# Patient Record
Sex: Female | Born: 1950 | Race: White | Hispanic: No | Marital: Married | Smoking: Former smoker
Health system: Southern US, Community
[De-identification: ages and names within clinical notes are randomized; demographics above are authoritative.]

## PROBLEM LIST (undated history)

## (undated) DIAGNOSIS — Z90710 Acquired absence of both cervix and uterus: Secondary | ICD-10-CM

## (undated) DIAGNOSIS — E039 Hypothyroidism, unspecified: Secondary | ICD-10-CM

## (undated) DIAGNOSIS — Z9889 Other specified postprocedural states: Secondary | ICD-10-CM

## (undated) DIAGNOSIS — Z780 Asymptomatic menopausal state: Secondary | ICD-10-CM

## (undated) DIAGNOSIS — M1990 Unspecified osteoarthritis, unspecified site: Secondary | ICD-10-CM

## (undated) DIAGNOSIS — Z8249 Family history of ischemic heart disease and other diseases of the circulatory system: Secondary | ICD-10-CM

## (undated) DIAGNOSIS — E785 Hyperlipidemia, unspecified: Secondary | ICD-10-CM

## (undated) DIAGNOSIS — M797 Fibromyalgia: Secondary | ICD-10-CM

## (undated) DIAGNOSIS — K219 Gastro-esophageal reflux disease without esophagitis: Secondary | ICD-10-CM

## (undated) DIAGNOSIS — R112 Nausea with vomiting, unspecified: Secondary | ICD-10-CM

## (undated) DIAGNOSIS — J302 Other seasonal allergic rhinitis: Secondary | ICD-10-CM

## (undated) HISTORY — DX: Acquired absence of both cervix and uterus: Z90.710

## (undated) HISTORY — DX: Morbid (severe) obesity due to excess calories: E66.01

## (undated) HISTORY — DX: Hyperlipidemia, unspecified: E78.5

## (undated) HISTORY — DX: Family history of ischemic heart disease and other diseases of the circulatory system: Z82.49

## (undated) HISTORY — PX: TONSILLECTOMY: SUR1361

## (undated) HISTORY — DX: Asymptomatic menopausal state: Z78.0

## (undated) HISTORY — PX: ABDOMINAL HYSTERECTOMY: SHX81

---

## 1975-03-26 HISTORY — PX: GASTRIC BYPASS: SHX52

## 1997-12-02 ENCOUNTER — Emergency Department (HOSPITAL_COMMUNITY): Admission: EM | Admit: 1997-12-02 | Discharge: 1997-12-02 | Payer: Self-pay | Admitting: *Deleted

## 1997-12-02 ENCOUNTER — Encounter: Payer: Self-pay | Admitting: Emergency Medicine

## 1999-06-19 ENCOUNTER — Encounter: Payer: Self-pay | Admitting: Family Medicine

## 1999-06-19 ENCOUNTER — Encounter: Admission: RE | Admit: 1999-06-19 | Discharge: 1999-06-19 | Payer: Self-pay | Admitting: Family Medicine

## 2000-03-18 ENCOUNTER — Emergency Department (HOSPITAL_COMMUNITY): Admission: EM | Admit: 2000-03-18 | Discharge: 2000-03-18 | Payer: Self-pay | Admitting: Emergency Medicine

## 2000-03-18 ENCOUNTER — Encounter: Payer: Self-pay | Admitting: *Deleted

## 2002-05-12 ENCOUNTER — Encounter: Payer: Self-pay | Admitting: Family Medicine

## 2002-05-12 ENCOUNTER — Encounter: Admission: RE | Admit: 2002-05-12 | Discharge: 2002-05-12 | Payer: Self-pay | Admitting: Family Medicine

## 2003-05-16 ENCOUNTER — Encounter: Admission: RE | Admit: 2003-05-16 | Discharge: 2003-05-16 | Payer: Self-pay | Admitting: Family Medicine

## 2004-06-11 ENCOUNTER — Encounter: Admission: RE | Admit: 2004-06-11 | Discharge: 2004-06-11 | Payer: Self-pay | Admitting: Family Medicine

## 2006-10-11 ENCOUNTER — Emergency Department (HOSPITAL_COMMUNITY): Admission: EM | Admit: 2006-10-11 | Discharge: 2006-10-12 | Payer: Self-pay | Admitting: *Deleted

## 2006-10-14 ENCOUNTER — Ambulatory Visit: Payer: Self-pay | Admitting: Family Medicine

## 2006-12-03 ENCOUNTER — Ambulatory Visit: Payer: Self-pay | Admitting: Family Medicine

## 2006-12-15 ENCOUNTER — Other Ambulatory Visit: Admission: RE | Admit: 2006-12-15 | Discharge: 2006-12-15 | Payer: Self-pay | Admitting: Family Medicine

## 2006-12-15 ENCOUNTER — Ambulatory Visit: Payer: Self-pay | Admitting: Family Medicine

## 2007-01-01 ENCOUNTER — Encounter: Admission: RE | Admit: 2007-01-01 | Discharge: 2007-01-01 | Payer: Self-pay | Admitting: Family Medicine

## 2007-04-26 HISTORY — PX: ROTATOR CUFF REPAIR: SHX139

## 2007-05-06 ENCOUNTER — Emergency Department (HOSPITAL_COMMUNITY): Admission: EM | Admit: 2007-05-06 | Discharge: 2007-05-06 | Payer: Self-pay | Admitting: Emergency Medicine

## 2007-05-08 ENCOUNTER — Ambulatory Visit: Payer: Self-pay | Admitting: Family Medicine

## 2007-05-12 ENCOUNTER — Ambulatory Visit (HOSPITAL_COMMUNITY): Admission: RE | Admit: 2007-05-12 | Discharge: 2007-05-12 | Payer: Self-pay | Admitting: Family Medicine

## 2007-05-18 ENCOUNTER — Ambulatory Visit: Payer: Self-pay | Admitting: Family Medicine

## 2007-08-19 ENCOUNTER — Ambulatory Visit: Payer: Self-pay | Admitting: Family Medicine

## 2007-08-24 HISTORY — PX: ROTATOR CUFF REPAIR: SHX139

## 2007-12-29 ENCOUNTER — Ambulatory Visit: Payer: Self-pay | Admitting: Family Medicine

## 2007-12-29 ENCOUNTER — Encounter: Admission: RE | Admit: 2007-12-29 | Discharge: 2007-12-29 | Payer: Self-pay | Admitting: Family Medicine

## 2008-02-17 ENCOUNTER — Encounter: Admission: RE | Admit: 2008-02-17 | Discharge: 2008-02-17 | Payer: Self-pay | Admitting: Family Medicine

## 2008-09-09 ENCOUNTER — Emergency Department (HOSPITAL_COMMUNITY): Admission: EM | Admit: 2008-09-09 | Discharge: 2008-09-09 | Payer: Self-pay | Admitting: Emergency Medicine
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
## 2009-03-06 ENCOUNTER — Encounter: Admission: RE | Admit: 2009-03-06 | Discharge: 2009-03-06 | Payer: Self-pay | Admitting: Family Medicine

## 2009-05-26 ENCOUNTER — Ambulatory Visit (HOSPITAL_COMMUNITY): Admission: RE | Admit: 2009-05-26 | Discharge: 2009-05-26 | Payer: Self-pay | Admitting: Gastroenterology

## 2009-06-23 HISTORY — PX: COLONOSCOPY: SHX174

## 2009-08-06 ENCOUNTER — Emergency Department (HOSPITAL_COMMUNITY): Admission: EM | Admit: 2009-08-06 | Discharge: 2009-08-06 | Payer: Self-pay | Admitting: Emergency Medicine

## 2010-01-30 ENCOUNTER — Emergency Department (HOSPITAL_COMMUNITY): Admission: EM | Admit: 2010-01-30 | Discharge: 2010-01-30 | Payer: Self-pay | Admitting: Emergency Medicine

## 2010-03-16 ENCOUNTER — Encounter
Admission: RE | Admit: 2010-03-16 | Discharge: 2010-03-16 | Payer: Self-pay | Source: Home / Self Care | Attending: Family Medicine | Admitting: Family Medicine

## 2010-04-15 ENCOUNTER — Encounter: Payer: Self-pay | Admitting: Family Medicine

## 2010-06-05 LAB — URINALYSIS, ROUTINE W REFLEX MICROSCOPIC
Bilirubin Urine: NEGATIVE
Glucose, UA: NEGATIVE mg/dL
Hgb urine dipstick: NEGATIVE
Nitrite: NEGATIVE
Protein, ur: NEGATIVE mg/dL
Specific Gravity, Urine: 1.025 (ref 1.005–1.030)
Urobilinogen, UA: 0.2 mg/dL (ref 0.0–1.0)
pH: 6.5 (ref 5.0–8.0)

## 2010-06-05 LAB — CBC
HCT: 40 % (ref 36.0–46.0)
MCH: 31.1 pg (ref 26.0–34.0)
MCV: 91.5 fL (ref 78.0–100.0)
RBC: 4.37 MIL/uL (ref 3.87–5.11)
RDW: 12.9 % (ref 11.5–15.5)

## 2010-06-05 LAB — DIFFERENTIAL
Basophils Absolute: 0 10*3/uL (ref 0.0–0.1)
Lymphocytes Relative: 37 % (ref 12–46)
Neutro Abs: 3.8 10*3/uL (ref 1.7–7.7)

## 2010-06-05 LAB — BASIC METABOLIC PANEL
Chloride: 108 mEq/L (ref 96–112)
Creatinine, Ser: 0.84 mg/dL (ref 0.4–1.2)
Glucose, Bld: 98 mg/dL (ref 70–99)

## 2010-07-02 LAB — POCT CARDIAC MARKERS
Myoglobin, poc: 104 ng/mL (ref 12–200)
Troponin i, poc: <0.05 ng/mL (ref 0.00–0.09)
Troponin i, poc: <0.05 ng/mL (ref 0.00–0.09)

## 2010-07-02 LAB — POCT PREGNANCY, URINE: Preg Test, Ur: NEGATIVE

## 2010-07-02 LAB — URINE MICROSCOPIC-ADD ON

## 2010-07-02 LAB — CBC
MCV: 86.8 fL (ref 78.0–100.0)
Platelets: 194 10*3/uL (ref 150–400)
RDW: 15.9 % — ABNORMAL HIGH (ref 11.5–15.5)
WBC: 7.4 10*3/uL (ref 4.0–10.5)

## 2010-07-02 LAB — URINALYSIS, ROUTINE W REFLEX MICROSCOPIC
Bilirubin Urine: NEGATIVE
Nitrite: NEGATIVE
Protein, ur: NEGATIVE mg/dL
Urobilinogen, UA: 0.2 mg/dL (ref 0.0–1.0)

## 2010-07-02 LAB — POCT I-STAT, CHEM 8
Chloride: 109 mEq/L (ref 96–112)
HCT: 38 % (ref 36.0–46.0)
TCO2: 24 mmol/L (ref 0–100)

## 2010-11-21 ENCOUNTER — Emergency Department (HOSPITAL_COMMUNITY)
Admission: EM | Admit: 2010-11-21 | Discharge: 2010-11-22 | Disposition: A | Discharge status: Home / Self Care | Payer: No Typology Code available for payment source | Attending: Emergency Medicine | Admitting: Emergency Medicine

## 2010-11-21 DIAGNOSIS — R51 Headache: Secondary | ICD-10-CM | POA: Insufficient documentation

## 2010-11-21 DIAGNOSIS — Z7982 Long term (current) use of aspirin: Secondary | ICD-10-CM | POA: Insufficient documentation

## 2010-11-21 DIAGNOSIS — Z79899 Other long term (current) drug therapy: Secondary | ICD-10-CM | POA: Insufficient documentation

## 2010-11-21 DIAGNOSIS — S335XXA Sprain of ligaments of lumbar spine, initial encounter: Secondary | ICD-10-CM | POA: Insufficient documentation

## 2010-11-21 DIAGNOSIS — E7800 Pure hypercholesterolemia, unspecified: Secondary | ICD-10-CM | POA: Insufficient documentation

## 2010-11-21 DIAGNOSIS — E039 Hypothyroidism, unspecified: Secondary | ICD-10-CM | POA: Insufficient documentation

## 2010-11-21 DIAGNOSIS — M79609 Pain in unspecified limb: Secondary | ICD-10-CM | POA: Insufficient documentation

## 2010-12-14 LAB — I-STAT 8, (EC8 V) (CONVERTED LAB)
Acid-base deficit: 1
BUN: 14
Chloride: 106
HCT: 41
Hemoglobin: 13.9
Operator id: 146091
Potassium: 4.3

## 2010-12-14 LAB — HEPATIC FUNCTION PANEL
ALT: 49 — ABNORMAL HIGH
AST: 39 — ABNORMAL HIGH
Albumin: 3.4 — ABNORMAL LOW
Alkaline Phosphatase: 119 — ABNORMAL HIGH
Total Bilirubin: 0.4

## 2010-12-14 LAB — POCT I-STAT CREATININE
Creatinine, Ser: 0.9
Operator id: 146091

## 2010-12-31 ENCOUNTER — Other Ambulatory Visit: Payer: Self-pay | Admitting: Family Medicine

## 2010-12-31 DIAGNOSIS — M545 Low back pain: Secondary | ICD-10-CM

## 2011-01-02 ENCOUNTER — Ambulatory Visit
Admission: RE | Admit: 2011-01-02 | Discharge: 2011-01-02 | Disposition: A | Discharge status: Home / Self Care | Payer: No Typology Code available for payment source | Source: Ambulatory Visit | Attending: Family Medicine | Admitting: Family Medicine

## 2011-01-02 DIAGNOSIS — M545 Low back pain: Secondary | ICD-10-CM

## 2011-01-07 LAB — URINALYSIS, ROUTINE W REFLEX MICROSCOPIC
Bilirubin Urine: NEGATIVE
Glucose, UA: NEGATIVE
Hgb urine dipstick: NEGATIVE
Ketones, ur: NEGATIVE
Protein, ur: NEGATIVE

## 2011-01-07 LAB — URINE MICROSCOPIC-ADD ON

## 2011-01-07 LAB — URINE CULTURE

## 2011-01-14 IMAGING — CR DG CHEST 2V
2 series · 2 of 2 positions shown · non-contrast
Comparison: Chest 05/06/07.

CLINICAL DATA: Chest pain.

CHEST - 2 VIEW

[w chest pa]
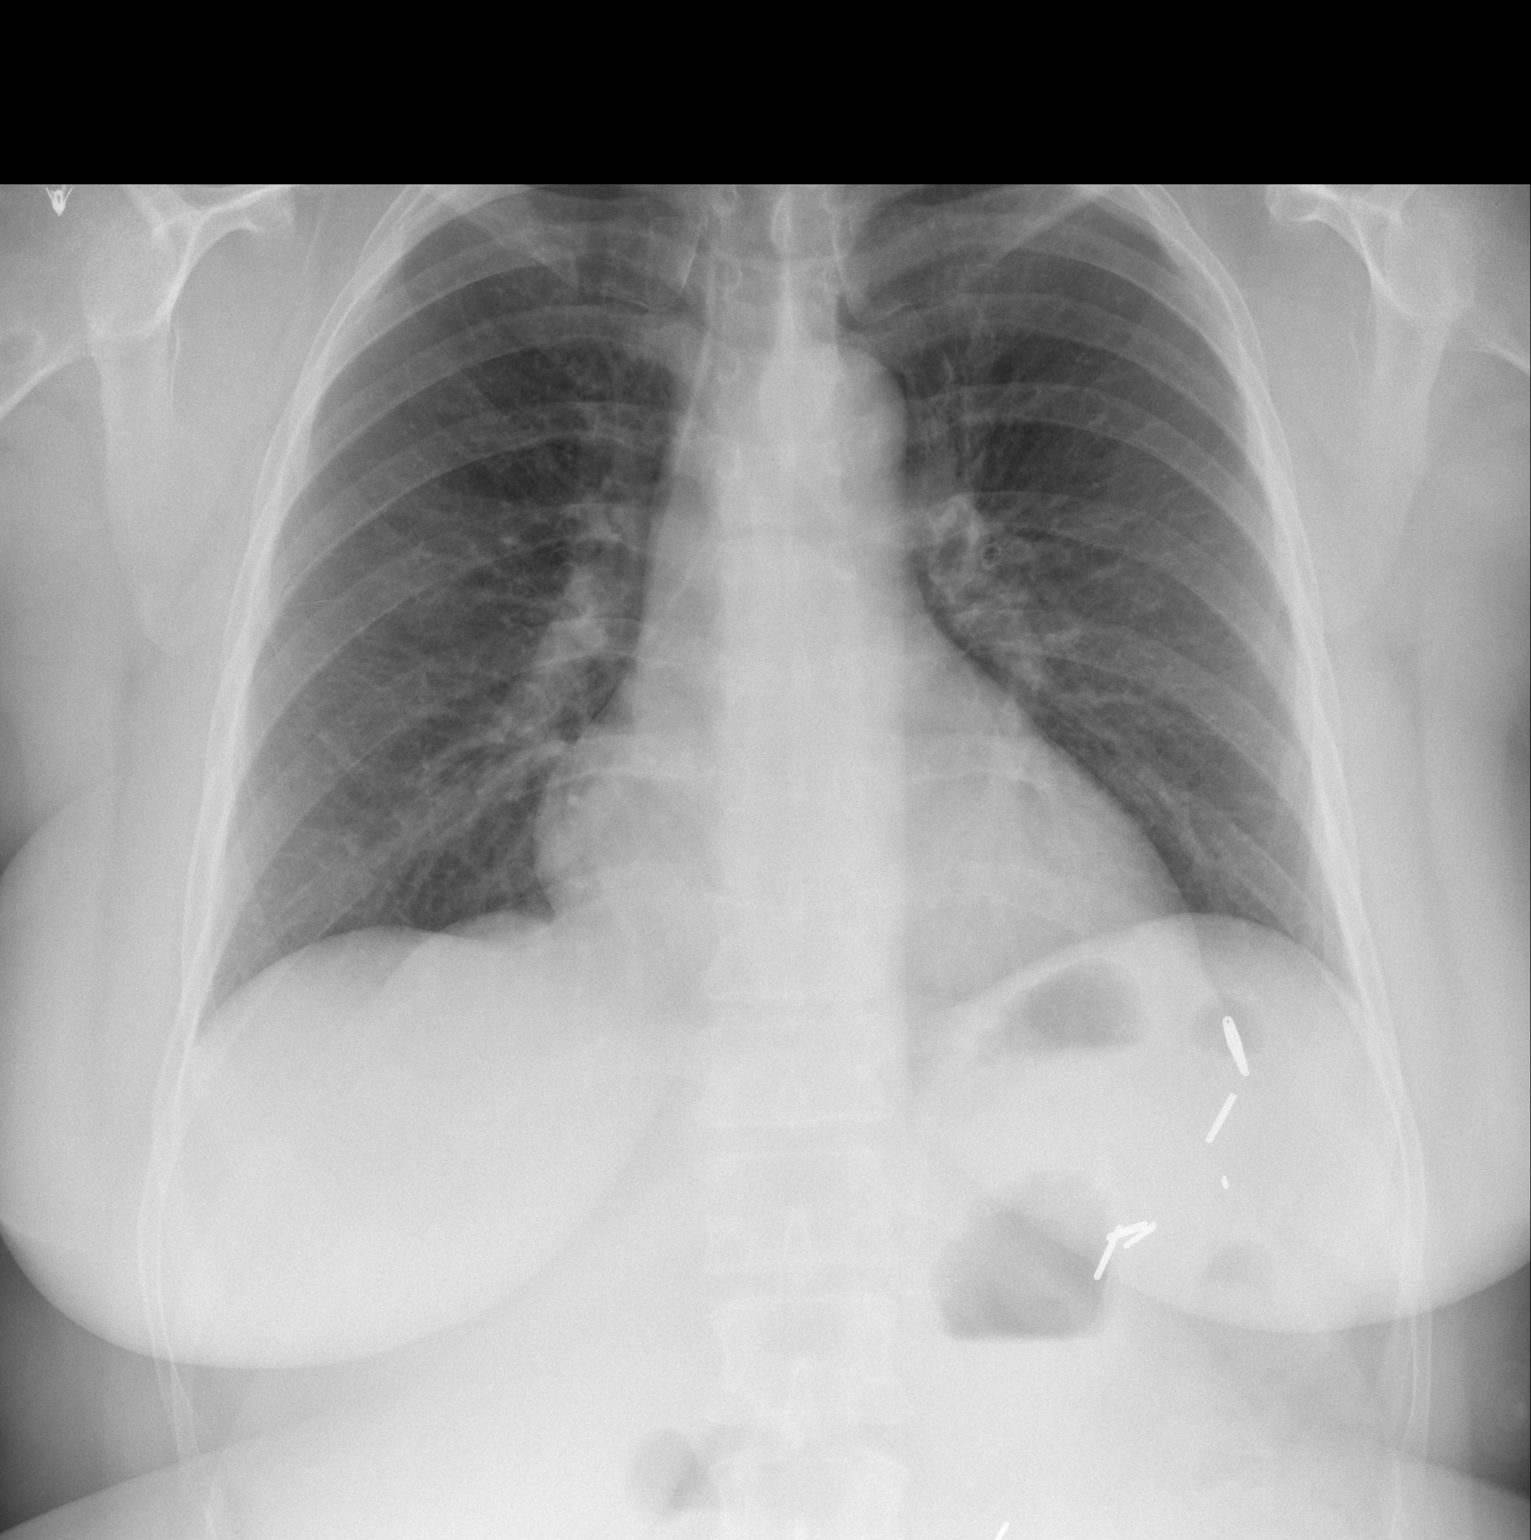

[w chest lat]
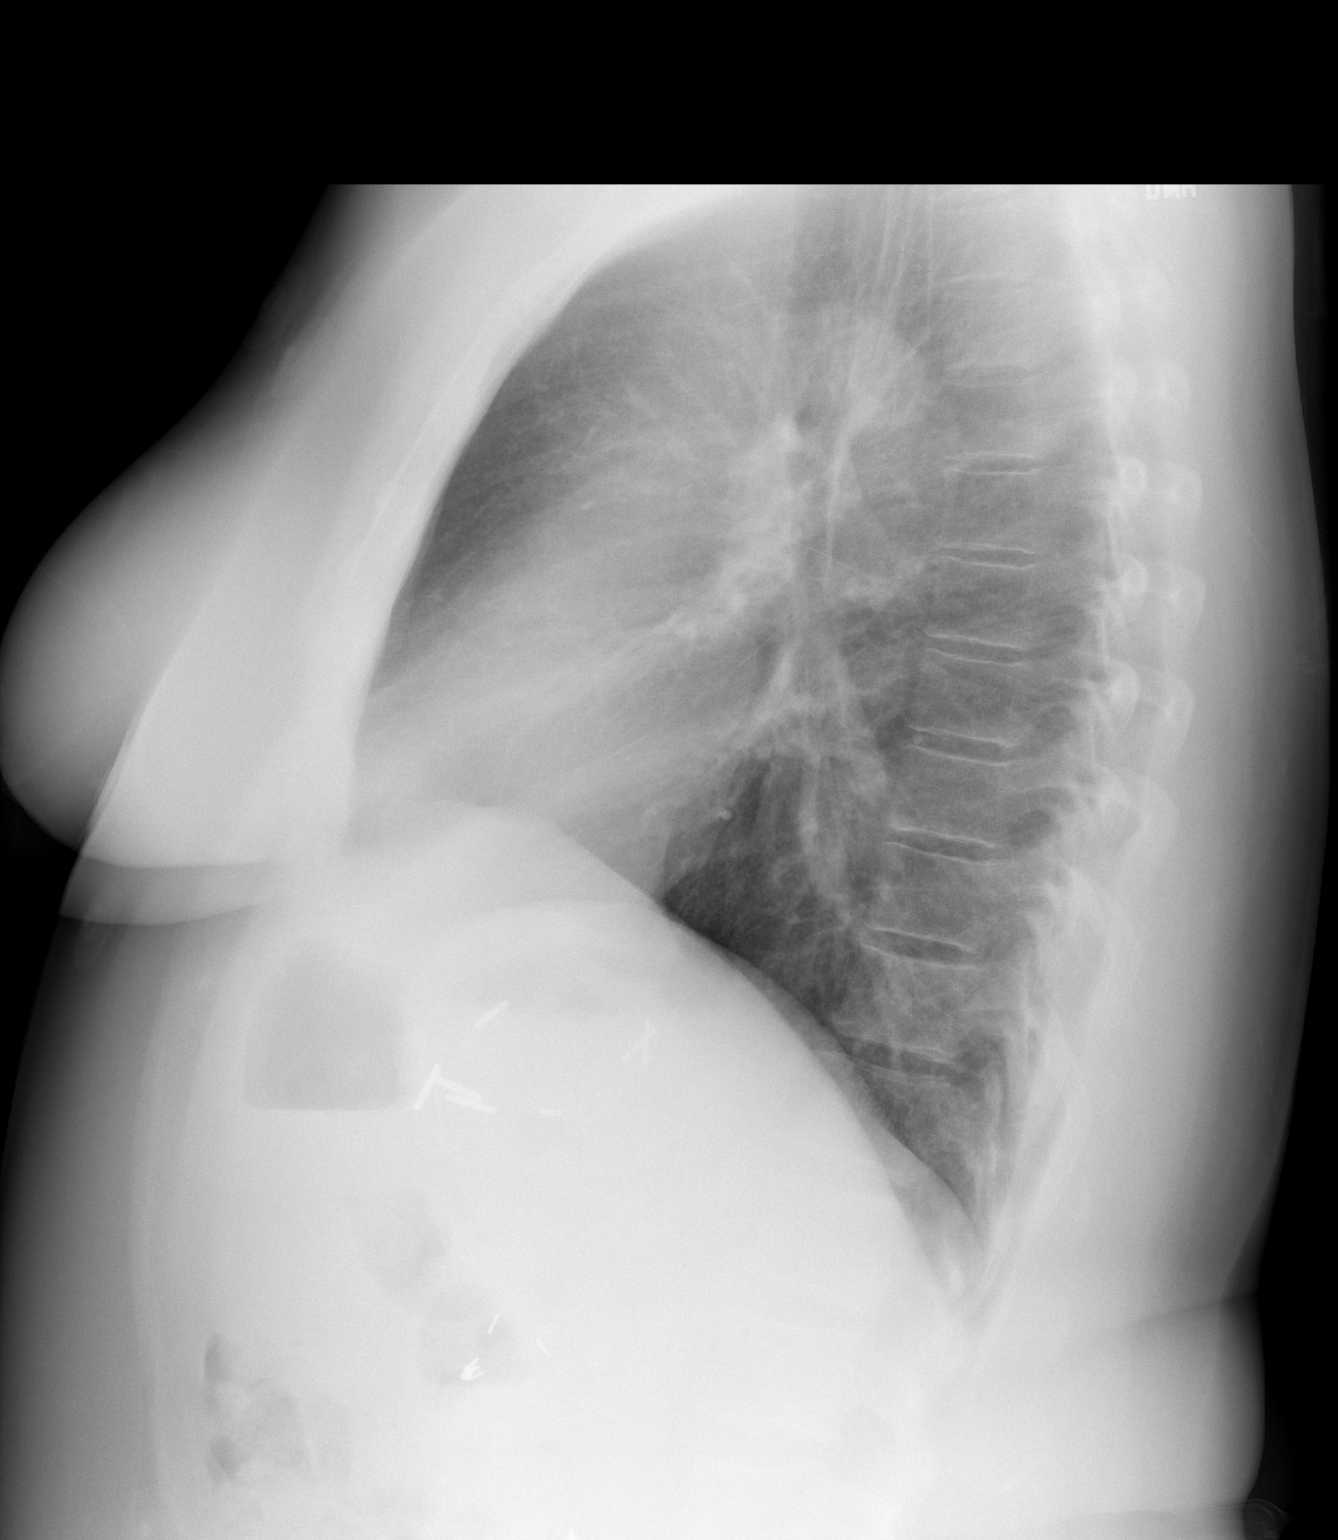

[2 of 2 positions shown; findings below may reference images not displayed]

FINDINGS: Lungs clear.  Heart size normal.  No effusion or focal
bony abnormality.  Surgical clips in the left upper quadrant again
noted.
IMPRESSION: No acute finding.  Stable compared to prior exam.

## 2011-02-27 ENCOUNTER — Ambulatory Visit: Payer: 59 | Attending: Neurology | Admitting: Physical Therapy

## 2011-02-27 DIAGNOSIS — IMO0001 Reserved for inherently not codable concepts without codable children: Secondary | ICD-10-CM | POA: Insufficient documentation

## 2011-02-27 DIAGNOSIS — R5381 Other malaise: Secondary | ICD-10-CM | POA: Insufficient documentation

## 2011-02-27 DIAGNOSIS — M545 Low back pain, unspecified: Secondary | ICD-10-CM | POA: Insufficient documentation

## 2011-03-01 ENCOUNTER — Ambulatory Visit: Payer: 59 | Admitting: *Deleted

## 2011-03-04 ENCOUNTER — Ambulatory Visit: Payer: 59 | Admitting: Physical Therapy

## 2011-03-06 ENCOUNTER — Ambulatory Visit: Payer: 59 | Admitting: Physical Therapy

## 2011-03-11 ENCOUNTER — Ambulatory Visit: Payer: 59 | Admitting: Physical Therapy

## 2011-03-13 ENCOUNTER — Ambulatory Visit: Payer: 59 | Admitting: Physical Therapy

## 2011-03-20 ENCOUNTER — Ambulatory Visit: Payer: 59 | Admitting: Physical Therapy

## 2011-06-03 ENCOUNTER — Encounter: Payer: Self-pay | Admitting: *Deleted

## 2011-06-11 ENCOUNTER — Other Ambulatory Visit: Payer: Self-pay | Admitting: Family Medicine

## 2011-06-11 DIAGNOSIS — Z1231 Encounter for screening mammogram for malignant neoplasm of breast: Secondary | ICD-10-CM

## 2011-06-19 ENCOUNTER — Ambulatory Visit: Payer: No Typology Code available for payment source

## 2011-10-18 ENCOUNTER — Other Ambulatory Visit: Payer: Self-pay | Admitting: Family Medicine

## 2011-10-18 DIAGNOSIS — Z1231 Encounter for screening mammogram for malignant neoplasm of breast: Secondary | ICD-10-CM

## 2011-10-28 ENCOUNTER — Ambulatory Visit: Payer: No Typology Code available for payment source

## 2012-03-02 ENCOUNTER — Encounter: Payer: 59 | Admitting: *Deleted

## 2012-06-11 ENCOUNTER — Encounter: Payer: Self-pay | Admitting: Cardiology

## 2012-10-14 ENCOUNTER — Ambulatory Visit (INDEPENDENT_AMBULATORY_CARE_PROVIDER_SITE_OTHER): Payer: 59 | Admitting: Cardiology

## 2012-10-14 ENCOUNTER — Encounter: Payer: Self-pay | Admitting: Cardiology

## 2012-10-14 VITALS — BP 142/84 | HR 65 | Ht 64.0 in | Wt 258.0 lb

## 2012-10-14 DIAGNOSIS — R0609 Other forms of dyspnea: Secondary | ICD-10-CM | POA: Insufficient documentation

## 2012-10-14 DIAGNOSIS — R079 Chest pain, unspecified: Secondary | ICD-10-CM

## 2012-10-14 DIAGNOSIS — E785 Hyperlipidemia, unspecified: Secondary | ICD-10-CM

## 2012-10-14 NOTE — Patient Instructions (Signed)
We will schedule you for a nuclear stress test and an echocardiogram

## 2012-10-14 NOTE — Progress Notes (Signed)
Haley Larson Date of Birth: 1950-08-09 Medical Record #[id number]  History of Present Illness: [name] is seen at the request of Dr. Creta Levin for evaluation of dyspnea and chest pain. She is a pleasant 62 year old white female who I evaluated in 2009. She presented at that time with atypical chest pain. A stress Cardiolite study in December 2009 was normal. She does have a strong family history of coronary disease. She also has a history of hyperlipidemia. She denies a history of diabetes or hypertension. Recently she has experienced 2 episodes of left precordial chest pain. She describes this as a sharp pain that occurred at work. These episodes occurred about one month apart. Is associated with sweating and nausea. She also describes increased dyspnea on exertion. She reports a 20 pound weight gain this past year. She complains of numbness in her left fingers. She describes increased edema in her lower extremities. She had remote gastric bypass surgery in the 1970s. She has not been evaluated for sleep apnea in the past. She does snore.  Current Outpatient Prescriptions on File Prior to Visit  Medication Sig Dispense Refill  . aspirin 81 MG tablet Take 81 mg by mouth daily.      . Omega-3 Fatty Acids (FISH OIL PO) Take 3 tablets by mouth daily.       . rosuvastatin (CRESTOR) 10 MG tablet Take 10 mg by mouth daily.       No current facility-administered medications on file prior to visit.    No Known Allergies  Past Medical History  Diagnosis Date  . Chest pain   . H/O: hysterectomy   . Dyslipidemia   . Postmenopausal   . Morbid obesity   . Family history of early CAD     STRONG    Past Surgical History  Procedure Laterality Date  . Rotator cuff repair  04/2007  . Rotator cuff repair  08/2007  . Gastric bypass  1977    History  Smoking status  . Former Smoker  Smokeless tobacco  . Not on file    History  Alcohol Use No    Family History  Problem Relation  Age of Onset  . Other Father     infection   . Coronary artery disease Mother     with stents  . Atrial fibrillation Mother     with pacemaker  . Heart failure Mother   . Diabetes Mother   . Heart attack Brother 38    massive  MI  . Heart attack Brother 54    x3  . Heart attack Brother 38    with stent  . Coronary artery disease Brother     with stent  . Alcohol abuse Brother   . Coronary artery disease Brother     with stents    Review of Systems: As noted in history of present illness.. Patient has had extensive GI evaluation in the past including abdominal ultrasound, hepatobiliary scan, upper and lower endoscopy. All were unremarkable. All other systems were reviewed and are negative.  Physical Exam: BP 142/84  Pulse 65  Ht 5\' 4"  (1.626 m)  Wt 258 lb (117.028 kg)  BMI 44.26 kg/m2  SpO2 91% She is an obese white female in no acute distress. HEENT: Normocephalic, atraumatic. Pupils equal round and reactive. Extraocular movements are full. Sclera are clear. Oropharynx is clear. Dentition is in good repair. Neck is without JVD, adenopathy, thyromegaly, or bruits. Lungs: Clear Cardiovascular: Regular rate and rhythm, normal S1 and  S2, no gallop, murmur, or click. Abdomen: Morbidly obese, soft, nontender. No masses or hepatosplenomegaly. No tenderness. Bowel sounds are positive. Extremities: No cyanosis, clubbing, or edema. Pulses are 2+ and symmetric. Skin: Warm and dry Neuro: Alert and oriented x3.  LABORATORY DATA: Laboratory data reviewed from April 2014 demonstrated normal chemistry panel, CBC, and TSH. Lipids were under good control. ECG today demonstrates normal sinus rhythm. Cannot rule out septal infarct age undetermined. No acute ST changes.  Assessment / Plan: 1. Atypical chest pain. Cardiac risk factors of hyperlipidemia, obesity, and strong family history. I recommended a stress Myoview study which we will compare to her study in 2009.  2. Dyspnea. Patient  reports increased edema but her exam today is benign. We will obtain an echocardiogram to further evaluate these symptoms. If her cardiac evaluation is unrevealing I would consider a sleep study to rule out sleep apnea.  3. Morbid obesity.  4. Hyperlipidemia-controlled on statin therapy and fish oil.

## 2012-10-21 ENCOUNTER — Ambulatory Visit (HOSPITAL_BASED_OUTPATIENT_CLINIC_OR_DEPARTMENT_OTHER): Payer: 59 | Admitting: Radiology

## 2012-10-21 ENCOUNTER — Ambulatory Visit (HOSPITAL_COMMUNITY): Payer: 59 | Attending: Cardiovascular Disease | Admitting: Radiology

## 2012-10-21 VITALS — BP 103/59 | HR 59 | Ht 64.0 in | Wt 256.0 lb

## 2012-10-21 DIAGNOSIS — R0602 Shortness of breath: Secondary | ICD-10-CM

## 2012-10-21 DIAGNOSIS — E785 Hyperlipidemia, unspecified: Secondary | ICD-10-CM

## 2012-10-21 DIAGNOSIS — R5381 Other malaise: Secondary | ICD-10-CM | POA: Insufficient documentation

## 2012-10-21 DIAGNOSIS — R072 Precordial pain: Secondary | ICD-10-CM

## 2012-10-21 DIAGNOSIS — E669 Obesity, unspecified: Secondary | ICD-10-CM | POA: Insufficient documentation

## 2012-10-21 DIAGNOSIS — R0609 Other forms of dyspnea: Secondary | ICD-10-CM | POA: Insufficient documentation

## 2012-10-21 DIAGNOSIS — R079 Chest pain, unspecified: Secondary | ICD-10-CM

## 2012-10-21 DIAGNOSIS — Z8249 Family history of ischemic heart disease and other diseases of the circulatory system: Secondary | ICD-10-CM | POA: Insufficient documentation

## 2012-10-21 DIAGNOSIS — R0989 Other specified symptoms and signs involving the circulatory and respiratory systems: Secondary | ICD-10-CM | POA: Insufficient documentation

## 2012-10-21 DIAGNOSIS — Z87891 Personal history of nicotine dependence: Secondary | ICD-10-CM | POA: Insufficient documentation

## 2012-10-21 MED ORDER — TECHNETIUM TC 99M SESTAMIBI GENERIC - CARDIOLITE
33.0000 | Freq: Once | INTRAVENOUS | Status: AC | PRN
  Administered 2012-10-21: 33 via INTRAVENOUS

## 2012-10-21 NOTE — Progress Notes (Signed)
[facility] SITE 3 NUCLEAR MED [address] [place], [place] [address] [phone]    Cardiology Nuclear Med Study  Haley Larson is a 62 y.o. female     MRN : [id number]     DOB: 12/06/1950  Procedure Date: 10/21/2012  Nuclear Med Background Indication for Stress Test:  Evaluation for Ischemia History:  '09 MPS:no ischemia, EF=75%; 10/21/12 Echo:EF=60% Cardiac Risk Factors: Family History - CAD, History of Smoking, Lipids and Obesity  Symptoms:  Chest Pressure/Tightness (last episode of chest discomfort was today while getting echo done, none now), Diaphoresis, DOE and Fatigue with and without Exertion   Nuclear Pre-Procedure Caffeine/Decaff Intake:  None> 12 hrs NPO After: 8:00pm   Lungs:  Clear. O2 Sat: 96% on room air. IV 0.9% NS with Angio Cath:  22g  IV Site: L Antecubital x 1, tolerated well IV Started by:  Irean Hong, RN  Chest Size (in):  42 Cup Size: C  Height: 5\' 4"  (1.626 m)  Weight:  256 lb (116.121 kg)  BMI:  Body mass index is 43.92 kg/(m^2). Tech Comments:  n/a    Nuclear Med Study 1 or 2 day study: 2 day  Stress Test Type:  Stress  Reading MD: Marca Ancona, MD  Order Authorizing Provider:  Peter Swaziland, MD  Resting Radionuclide: Technetium 74m Sestamibi  Resting Radionuclide Dose: 33.0 mCi  10/28/12  Stress Radionuclide:  Technetium 49m Sestamibi  Stress Radionuclide Dose: 33.0 mCi   10/22/12          Stress Protocol Rest HR: 59 Stress HR: 150  Rest BP: 103/59 Stress BP: 233/84  Exercise Time (min): 5:01 METS: 7.0   Predicted Max HR: 160 bpm % Max HR: 93.75 bpm Rate Pressure Product: 21308   Dose of Adenosine (mg):  n/a Dose of Lexiscan: n/a mg  Dose of Atropine (mg): n/a Dose of Dobutamine: n/a mcg/kg/min (at max HR)  Stress Test Technologist: Smiley Houseman, CMA-N  Nuclear Technologist:  Doyne Keel, CNMT     Rest Procedure:  Myocardial perfusion imaging was performed at rest 45 minutes following the intravenous administration of  Technetium 24m Sestamibi.  Rest ECG: NSR - Normal EKG  Stress Procedure:  The patient exercised on the treadmill utilizing the Bruce Protocol for 5:01 minutes. The patient stopped due to fatigue and dyspnea with her O2 SATS dropping to 90.  She denied any chest pain.  Technetium 98m Sestamibi was injected at peak exercise and myocardial perfusion imaging was performed after a brief delay.  Stress ECG: No significant change from baseline ECG  QPS Raw Data Images:  Normal; no motion artifact; normal heart/lung ratio. Stress Images:  Normal homogeneous uptake in all areas of the myocardium. Rest Images:  Normal homogeneous uptake in all areas of the myocardium. Subtraction (SDS):  There is no evidence of scar or ischemia. Transient Ischemic Dilatation (Normal <1.22):  n/a Lung/Heart Ratio (Normal <0.45):  0.43  Quantitative Gated Spect Images QGS EDV:  73 ml QGS ESV:  17 ml   Impression Exercise Capacity:  Below average.  BP Response:  Normal blood pressure response. Clinical Symptoms:  Dyspnea, fatigue.  ECG Impression:  No significant ST segment change suggestive of ischemia. Comparison with Prior Nuclear Study: No significant change from previous study  Overall Impression:  Normal stress nuclear study.  LV Ejection Fraction: 77%.  LV Wall Motion:  NL LV Function; NL Wall Motion  Marca Ancona 10/28/2012

## 2012-10-21 NOTE — Progress Notes (Signed)
Echocardiogram performed.  

## 2012-10-28 ENCOUNTER — Ambulatory Visit (HOSPITAL_COMMUNITY): Payer: No Typology Code available for payment source | Attending: Cardiology | Admitting: Radiology

## 2012-10-28 DIAGNOSIS — R0989 Other specified symptoms and signs involving the circulatory and respiratory systems: Secondary | ICD-10-CM

## 2012-10-28 MED ORDER — TECHNETIUM TC 99M SESTAMIBI GENERIC - CARDIOLITE
33.0000 | Freq: Once | INTRAVENOUS | Status: AC | PRN
  Administered 2012-10-28: 33 via INTRAVENOUS

## 2012-12-28 ENCOUNTER — Other Ambulatory Visit: Payer: Self-pay | Admitting: Family Medicine

## 2012-12-28 DIAGNOSIS — Z1231 Encounter for screening mammogram for malignant neoplasm of breast: Secondary | ICD-10-CM

## 2012-12-29 ENCOUNTER — Ambulatory Visit
Admission: RE | Admit: 2012-12-29 | Discharge: 2012-12-29 | Disposition: A | Discharge status: Home / Self Care | Payer: 59 | Source: Ambulatory Visit | Attending: Family Medicine | Admitting: Family Medicine

## 2012-12-29 DIAGNOSIS — Z1231 Encounter for screening mammogram for malignant neoplasm of breast: Secondary | ICD-10-CM

## 2013-10-28 ENCOUNTER — Encounter: Payer: Self-pay | Admitting: Cardiology

## 2013-10-28 ENCOUNTER — Ambulatory Visit (INDEPENDENT_AMBULATORY_CARE_PROVIDER_SITE_OTHER): Payer: 59 | Admitting: Cardiology

## 2013-10-28 DIAGNOSIS — R0609 Other forms of dyspnea: Secondary | ICD-10-CM

## 2013-10-28 DIAGNOSIS — R0989 Other specified symptoms and signs involving the circulatory and respiratory systems: Secondary | ICD-10-CM

## 2013-10-28 NOTE — Patient Instructions (Signed)
Continue your efforts at weight loss and exercise.  We will follow up in one year

## 2013-10-28 NOTE — Progress Notes (Signed)
Haley Larson Date of Birth: March 28, 1950 Medical Record #[id number]  History of Present Illness: Haley Larson is seen for yearly follow up. She has a history of morbid obesity and dsypnea. She has a strong family history of CAD. A stress Cardiolite study in December 2009 and July 2014 were normal.  She also has a history of hyperlipidemia. She denies a history of diabetes or hypertension. She had remote gastric bypass surgery in the 1970s. She has not been evaluated for sleep apnea in the past. She states she does snore. Echo in July 2014 was also normal. She states she is now on a weight loss program with Dr. Purnell Shoemaker.  Current Outpatient Prescriptions on File Prior to Visit  Medication Sig Dispense Refill  . aspirin 81 MG tablet Take 81 mg by mouth daily.      . fluticasone (FLONASE) 50 MCG/ACT nasal spray       . levothyroxine (SYNTHROID, LEVOTHROID) 88 MCG tablet Take 88 mcg by mouth daily before breakfast.       . Multiple Vitamin (MULTIVITAMIN) tablet Take 1 tablet by mouth daily.      . Omega-3 Fatty Acids (FISH OIL PO) Take 3 tablets by mouth daily.       . rosuvastatin (CRESTOR) 10 MG tablet Take 10 mg by mouth daily.       No current facility-administered medications on file prior to visit.    No Known Allergies  Past Medical History  Diagnosis Date  . Chest pain   . H/O: hysterectomy   . Dyslipidemia   . Postmenopausal   . Morbid obesity   . Family history of early CAD     STRONG    Past Surgical History  Procedure Laterality Date  . Rotator cuff repair  04/2007  . Rotator cuff repair  08/2007  . Gastric bypass  1977    History  Smoking status  . Former Smoker  Smokeless tobacco  . Not on file    History  Alcohol Use No    Family History  Problem Relation Age of Onset  . Other Father     infection   . Coronary artery disease Mother     with stents  . Atrial fibrillation Mother     with pacemaker  . Heart failure Mother   . Diabetes Mother    . Heart attack Brother 38    massive  MI  . Heart attack Brother 54    x3  . Heart attack Brother 19    with stent  . Coronary artery disease Brother     with stent  . Alcohol abuse Brother   . Coronary artery disease Brother     with stents    Review of Systems: As noted in history of present illness..  All other systems were reviewed and are negative.  Physical Exam: BP 128/86  Ht 5' 3.5" (1.613 m)  Wt 256 lb 4.8 oz (116.257 kg)  BMI 44.68 kg/m2 She is an obese white female in no acute distress. HEENT: Normal Neck is without JVD, adenopathy, thyromegaly, or bruits. Lungs: Clear Cardiovascular: Regular rate and rhythm, normal S1 and S2, no gallop, murmur, or click. Abdomen: Morbidly obese, soft, nontender. No masses or hepatosplenomegaly. No tenderness. Bowel sounds are positive. Extremities: No cyanosis, clubbing, or edema. Pulses are 2+ and symmetric. Skin: Warm and dry Neuro: Alert and oriented x3.  LABORATORY DATA: ECG today demonstrates normal sinus rhythm. Minor nonspecific ST abnormality.  Assessment / Plan: 1.  Morbid obesity. I applaud her efforts at weight loss and aerobic exercise. Follow up in one year.  2. Dyspnea. Resolved. Normal cardiac evaluation last year.   3.  Hyperlipidemia-controlled on statin therapy and fish oil.

## 2013-11-02 ENCOUNTER — Telehealth: Payer: Self-pay | Admitting: Cardiology

## 2013-11-02 NOTE — Telephone Encounter (Signed)
Pt just called stating that she just received a call from the office and thought that it made being concerning her EKG that was done. Please call  Thanks

## 2013-11-02 NOTE — Telephone Encounter (Signed)
Returned call to patient. She stated she received a call from our office that she had an appointment on 8/14. RN informed her that there is not appointment scheduled and an appointment is NOT needed at this time per Dr. Elvis CoilJordan's last OV note.

## 2013-12-31 ENCOUNTER — Other Ambulatory Visit: Payer: Self-pay

## 2013-12-31 DIAGNOSIS — Z1231 Encounter for screening mammogram for malignant neoplasm of breast: Secondary | ICD-10-CM

## 2014-01-12 ENCOUNTER — Ambulatory Visit
Admission: RE | Admit: 2014-01-12 | Discharge: 2014-01-12 | Disposition: A | Discharge status: Home / Self Care | Payer: 59 | Source: Ambulatory Visit

## 2014-01-12 DIAGNOSIS — Z1231 Encounter for screening mammogram for malignant neoplasm of breast: Secondary | ICD-10-CM

## 2014-01-20 ENCOUNTER — Other Ambulatory Visit: Payer: Self-pay | Admitting: Physician Assistant

## 2014-01-25 ENCOUNTER — Other Ambulatory Visit: Payer: Self-pay | Admitting: Physician Assistant

## 2014-01-25 DIAGNOSIS — R928 Other abnormal and inconclusive findings on diagnostic imaging of breast: Secondary | ICD-10-CM

## 2014-02-08 ENCOUNTER — Other Ambulatory Visit: Payer: Self-pay | Admitting: Physician Assistant

## 2014-02-08 ENCOUNTER — Encounter (INDEPENDENT_AMBULATORY_CARE_PROVIDER_SITE_OTHER): Payer: Self-pay

## 2014-02-08 ENCOUNTER — Ambulatory Visit
Admission: RE | Admit: 2014-02-08 | Discharge: 2014-02-08 | Disposition: A | Discharge status: Home / Self Care | Payer: 59 | Source: Ambulatory Visit | Attending: Physician Assistant | Admitting: Physician Assistant

## 2014-02-08 DIAGNOSIS — R928 Other abnormal and inconclusive findings on diagnostic imaging of breast: Secondary | ICD-10-CM

## 2014-06-30 ENCOUNTER — Other Ambulatory Visit: Payer: Self-pay | Admitting: Physician Assistant

## 2014-06-30 ENCOUNTER — Other Ambulatory Visit: Payer: Self-pay

## 2014-06-30 DIAGNOSIS — R229 Localized swelling, mass and lump, unspecified: Principal | ICD-10-CM

## 2014-06-30 DIAGNOSIS — IMO0002 Reserved for concepts with insufficient information to code with codable children: Secondary | ICD-10-CM

## 2014-07-08 ENCOUNTER — Ambulatory Visit
Admission: RE | Admit: 2014-07-08 | Discharge: 2014-07-08 | Disposition: A | Discharge status: Home / Self Care | Payer: 59 | Source: Ambulatory Visit

## 2014-07-08 DIAGNOSIS — IMO0002 Reserved for concepts with insufficient information to code with codable children: Secondary | ICD-10-CM

## 2014-07-08 DIAGNOSIS — R229 Localized swelling, mass and lump, unspecified: Principal | ICD-10-CM

## 2014-11-14 ENCOUNTER — Encounter: Payer: Self-pay | Admitting: Cardiology

## 2014-11-14 ENCOUNTER — Ambulatory Visit (INDEPENDENT_AMBULATORY_CARE_PROVIDER_SITE_OTHER): Payer: 59 | Admitting: Cardiology

## 2014-11-14 VITALS — BP 132/84 | HR 63 | Ht 63.0 in | Wt 246.0 lb

## 2014-11-14 DIAGNOSIS — E785 Hyperlipidemia, unspecified: Secondary | ICD-10-CM | POA: Diagnosis not present

## 2014-11-14 DIAGNOSIS — R0789 Other chest pain: Secondary | ICD-10-CM | POA: Diagnosis not present

## 2014-11-14 NOTE — Progress Notes (Signed)
Haley Larson Date of Birth: November 16, 1950 Medical Record #[id number]  History of Present Illness: Haley Larson is seen for yearly follow up. She has a history of morbid obesity and dsypnea. She has a strong family history of CAD. A stress Cardiolite study in December 2009 and July 2014 were normal.  She also has a history of hyperlipidemia and is on statin. She denies a history of diabetes or hypertension. She had remote gastric bypass surgery in the 1970s. She has not been evaluated for sleep apnea in the past. Echo in July 2014 was also normal.  On follow up today she does complain of intermittent epigastric pain. Not associated with activity. Symptoms relieved with Prilosec. Doesn't take this regularly. Using NSAIDs more frequently. She has lost 10 lbs this year. Has a bad right hip and chronic low back pain. States she may need hip surgery.  Current Outpatient Prescriptions on File Prior to Visit  Medication Sig Dispense Refill  . aspirin 81 MG tablet Take 81 mg by mouth daily.    Marland Kitchen estradiol (ESTRACE) 0.5 MG tablet Take 1 tablet by mouth daily.    . fluticasone (FLONASE) 50 MCG/ACT nasal spray     . levothyroxine (SYNTHROID, LEVOTHROID) 88 MCG tablet Take 88 mcg by mouth daily before breakfast.     . montelukast (SINGULAIR) 10 MG tablet Take 1 tablet by mouth at bedtime.    . Multiple Vitamin (MULTIVITAMIN) tablet Take 1 tablet by mouth daily.    . Omega-3 Fatty Acids (FISH OIL PO) Take 3 tablets by mouth daily.     Marland Kitchen omeprazole (PRILOSEC) 40 MG capsule Take 1 capsule by mouth daily as needed.    . rosuvastatin (CRESTOR) 10 MG tablet Take 10 mg by mouth daily.     No current facility-administered medications on file prior to visit.    No Known Allergies  Past Medical History  Diagnosis Date  . Chest pain   . H/O: hysterectomy   . Dyslipidemia   . Postmenopausal   . Morbid obesity   . Family history of early CAD     STRONG    Past Surgical History  Procedure Laterality Date   . Rotator cuff repair  04/2007  . Rotator cuff repair  08/2007  . Gastric bypass  1977    History  Smoking status  . Former Smoker  Smokeless tobacco  . Not on file    History  Alcohol Use No    Family History  Problem Relation Age of Onset  . Other Father     infection   . Coronary artery disease Mother     with stents  . Atrial fibrillation Mother     with pacemaker  . Heart failure Mother   . Diabetes Mother   . Heart attack Brother 38    massive  MI  . Heart attack Brother 54    x3  . Heart attack Brother 19    with stent  . Coronary artery disease Brother     with stent  . Alcohol abuse Brother   . Coronary artery disease Brother     with stents  . Heart attack Brother     Review of Systems: As noted in history of present illness..  All other systems were reviewed and are negative.  Physical Exam: BP 132/84 mmHg  Pulse 63  Ht  (1.6 m)  Wt 111.585 kg (246 lb)  BMI 43.59 kg/m2 She is an obese white female in no acute  distress. HEENT: Normal Neck is without JVD, adenopathy, thyromegaly, or bruits. Lungs: Clear Cardiovascular: Regular rate and rhythm, normal S1 and S2, no gallop, murmur, or click. Abdomen: Morbidly obese, soft, nontender. No masses or hepatosplenomegaly. No tenderness. Bowel sounds are positive. Extremities: No cyanosis, clubbing, or edema. Pulses are 2+ and symmetric. Skin: Warm and dry Neuro: Alert and oriented x3.  LABORATORY DATA: ECG today demonstrates normal sinus rhythm. Rate 62. Normal. I have personally reviewed and interpreted this study.   Assessment / Plan: 1. Morbid obesity. Continue efforts at weight loss. Follow up in one year.  2. Dyspnea. Resolved. Normal cardiac evaluation 2014  3.  Hyperlipidemia-controlled on statin therapy and fish oil.  4. GERD. Recommend taking Prilosec daily especially when taking NSAIDs.

## 2014-11-14 NOTE — Patient Instructions (Signed)
Continue your current therapy  I will see you in one year   

## 2014-12-12 ENCOUNTER — Other Ambulatory Visit: Payer: Self-pay | Admitting: Physician Assistant

## 2014-12-12 DIAGNOSIS — N63 Unspecified lump in unspecified breast: Secondary | ICD-10-CM

## 2015-01-19 ENCOUNTER — Ambulatory Visit
Admission: RE | Admit: 2015-01-19 | Discharge: 2015-01-19 | Disposition: A | Discharge status: Home / Self Care | Payer: Managed Care, Other (non HMO) | Source: Ambulatory Visit | Attending: Physician Assistant | Admitting: Physician Assistant

## 2015-01-19 DIAGNOSIS — N63 Unspecified lump in unspecified breast: Secondary | ICD-10-CM

## 2015-05-29 ENCOUNTER — Telehealth: Payer: Self-pay

## 2015-05-29 NOTE — Telephone Encounter (Signed)
Received surgical clearance from Dr.Daniel Eulah PontMurphy.Dr.Jordan cleared patient for surgery.Form faxed back to fax # [phone].

## 2015-07-04 ENCOUNTER — Other Ambulatory Visit: Payer: Self-pay | Admitting: Physician Assistant

## 2015-07-04 NOTE — H&P (Signed)
TOTAL HIP ADMISSION H&P  Patient is admitted for right total hip arthroplasty.  Subjective:  Chief Complaint: right hip pain  HPI: Haley Larson, 64 y.o. female, has a history of pain and functional disability in the right hip(s) due to arthritis and patient has failed non-surgical conservative treatments for greater than 12 weeks to include NSAID's and/or analgesics, corticosteriod injections and use of assistive devices.  Onset of symptoms was gradual starting 3 years ago with rapidlly worsening course since that time.The patient noted no past surgery on the right hip(s).  Patient currently rates pain in the right hip at 10 out of 10 with activity. Patient has night pain, worsening of pain with activity and weight bearing, trendelenberg gait and pain that interfers with activities of daily living. Patient has evidence of subchondral sclerosis and joint space narrowing by imaging studies. This condition presents safety issues increasing the risk of falls.  There is no current active infection.  Patient Active Problem List   Diagnosis Date Noted  . Chest pain 10/14/2012  . Dyspnea on exertion 10/14/2012  . Hyperlipidemia 10/14/2012  . Morbid obesity (HCC) 10/14/2012   Past Medical History  Diagnosis Date  . Chest pain   . H/O: hysterectomy   . Dyslipidemia   . Postmenopausal   . Morbid obesity   . Family history of early CAD     STRONG    Past Surgical History  Procedure Laterality Date  . Rotator cuff repair  04/2007  . Rotator cuff repair  08/2007  . Gastric bypass  1977     (Not in a hospital admission) No Known Allergies  Social History  Substance Use Topics  . Smoking status: Former Smoker  . Smokeless tobacco: Not on file  . Alcohol Use: No    Family History  Problem Relation Age of Onset  . Other Father     infection   . Coronary artery disease Mother     with stents  . Atrial fibrillation Mother     with pacemaker  . Heart failure Mother   . Diabetes  Mother   . Heart attack Brother 38    massive  MI  . Heart attack Brother 54    x3  . Heart attack Brother 58    with stent  . Coronary artery disease Brother     with stent  . Alcohol abuse Brother   . Coronary artery disease Brother     with stents  . Heart attack Brother      Review of Systems  Constitutional: Negative.   HENT: Negative.   Eyes: Negative.   Respiratory: Negative.   Cardiovascular: Negative.   Gastrointestinal: Negative.   Genitourinary: Negative.   Musculoskeletal: Positive for joint pain.  Skin: Negative.   Neurological: Negative.   Endo/Heme/Allergies: Negative.   Psychiatric/Behavioral: Negative.     Objective:  Physical Exam  Constitutional: She is oriented to person, place, and time. She appears well-developed and well-nourished.  HENT:  Head: Normocephalic and atraumatic.  Eyes: EOM are normal. Pupils are equal, round, and reactive to light.  Neck: Normal range of motion. Neck supple.  Cardiovascular: Normal rate and regular rhythm.   Respiratory: Effort normal and breath sounds normal.  GI: Soft. Bowel sounds are normal.  Musculoskeletal:  Examination of her right hip reveals markedly positive log roll with decreased internal rotation.  Minimal tenderness over the greater trochanter.  Negative straight leg raise.  She is neurovascularly intact distally  Neurological: She is alert and   oriented to person, place, and time.  Skin: Skin is warm and dry.  Psychiatric: She has a normal mood and affect. Her behavior is normal. Judgment and thought content normal.    Vital signs in last 24 hours: @VSRANGES@  Labs:   Estimated body mass index is 43.59 kg/(m^2) as calculated from the following:   Height as of 11/14/14: 5' 3" (1.6 m).   Weight as of 11/14/14: 111.585 kg (246 lb).   Imaging Review Plain radiographs demonstrate severe degenerative joint disease of the right hip(s). The bone quality appears to be fair for age and reported activity  level.  Assessment/Plan:  End stage arthritis, right hip(s)  The patient history, physical examination, clinical judgement of the provider and imaging studies are consistent with end stage degenerative joint disease of the right hip(s) and total hip arthroplasty is deemed medically necessary. The treatment options including medical management, injection therapy, arthroscopy and arthroplasty were discussed at length. The risks and benefits of total hip arthroplasty were presented and reviewed. The risks due to aseptic loosening, infection, stiffness, dislocation/subluxation,  thromboembolic complications and other imponderables were discussed.  The patient acknowledged the explanation, agreed to proceed with the plan and consent was signed. Patient is being admitted for inpatient treatment for surgery, pain control, PT, OT, prophylactic antibiotics, VTE prophylaxis, progressive ambulation and ADL's and discharge planning.The patient is planning to be discharged home with home health services 

## 2015-07-07 ENCOUNTER — Other Ambulatory Visit (HOSPITAL_COMMUNITY): Payer: Managed Care, Other (non HMO)

## 2015-07-07 ENCOUNTER — Encounter (HOSPITAL_COMMUNITY): Payer: Self-pay

## 2015-07-07 ENCOUNTER — Encounter (HOSPITAL_COMMUNITY)
Admission: RE | Admit: 2015-07-07 | Discharge: 2015-07-07 | Disposition: A | Discharge status: Home / Self Care | Payer: Managed Care, Other (non HMO) | Source: Ambulatory Visit | Attending: Orthopedic Surgery | Admitting: Orthopedic Surgery

## 2015-07-07 DIAGNOSIS — Z0183 Encounter for blood typing: Secondary | ICD-10-CM | POA: Diagnosis not present

## 2015-07-07 DIAGNOSIS — Z87891 Personal history of nicotine dependence: Secondary | ICD-10-CM | POA: Diagnosis not present

## 2015-07-07 DIAGNOSIS — Z01818 Encounter for other preprocedural examination: Secondary | ICD-10-CM | POA: Insufficient documentation

## 2015-07-07 DIAGNOSIS — E039 Hypothyroidism, unspecified: Secondary | ICD-10-CM | POA: Diagnosis not present

## 2015-07-07 DIAGNOSIS — Z7982 Long term (current) use of aspirin: Secondary | ICD-10-CM | POA: Insufficient documentation

## 2015-07-07 DIAGNOSIS — E785 Hyperlipidemia, unspecified: Secondary | ICD-10-CM | POA: Insufficient documentation

## 2015-07-07 DIAGNOSIS — Z01812 Encounter for preprocedural laboratory examination: Secondary | ICD-10-CM | POA: Insufficient documentation

## 2015-07-07 DIAGNOSIS — M1611 Unilateral primary osteoarthritis, right hip: Secondary | ICD-10-CM | POA: Diagnosis not present

## 2015-07-07 DIAGNOSIS — Z79899 Other long term (current) drug therapy: Secondary | ICD-10-CM | POA: Insufficient documentation

## 2015-07-07 DIAGNOSIS — K219 Gastro-esophageal reflux disease without esophagitis: Secondary | ICD-10-CM | POA: Insufficient documentation

## 2015-07-07 HISTORY — DX: Hypothyroidism, unspecified: E03.9

## 2015-07-07 HISTORY — DX: Gastro-esophageal reflux disease without esophagitis: K21.9

## 2015-07-07 HISTORY — DX: Unspecified osteoarthritis, unspecified site: M19.90

## 2015-07-07 HISTORY — DX: Other specified postprocedural states: Z98.890

## 2015-07-07 HISTORY — DX: Fibromyalgia: M79.7

## 2015-07-07 HISTORY — DX: Other seasonal allergic rhinitis: J30.2

## 2015-07-07 HISTORY — DX: Nausea with vomiting, unspecified: R11.2

## 2015-07-07 LAB — BASIC METABOLIC PANEL
Anion gap: 12 (ref 5–15)
BUN: 13 mg/dL (ref 6–20)
CALCIUM: 9.2 mg/dL (ref 8.9–10.3)
CO2: 26 mmol/L (ref 22–32)
CREATININE: 0.78 mg/dL (ref 0.44–1.00)
Chloride: 102 mmol/L (ref 101–111)
Glucose, Bld: 98 mg/dL (ref 65–99)
Potassium: 4 mmol/L (ref 3.5–5.1)
SODIUM: 140 mmol/L (ref 135–145)

## 2015-07-07 LAB — CBC
HCT: 41 % (ref 36.0–46.0)
Hemoglobin: 13.1 g/dL (ref 12.0–15.0)
MCH: 29.8 pg (ref 26.0–34.0)
MCHC: 32 g/dL (ref 30.0–36.0)
MCV: 93.4 fL (ref 78.0–100.0)
PLATELETS: 223 10*3/uL (ref 150–400)
RBC: 4.39 MIL/uL (ref 3.87–5.11)
RDW: 12.2 % (ref 11.5–15.5)
WBC: 7.6 10*3/uL (ref 4.0–10.5)

## 2015-07-07 LAB — ABO/RH: ABO/RH(D): O POS

## 2015-07-07 LAB — SURGICAL PCR SCREEN
MRSA, PCR: NEGATIVE
STAPHYLOCOCCUS AUREUS: POSITIVE — AB

## 2015-07-07 NOTE — Progress Notes (Signed)
I called a prescription for Mupirocin ointment to [facility], [place], [place]. 

## 2015-07-07 NOTE — Pre-Procedure Instructions (Signed)
AMARIANNA ABPLANALP  07/07/2015      [id number] - [place], [place] - [address] [place] [address] Phone: [phone] Fax: [phone]    Your procedure is scheduled on Wednesday April 26th 2017.  Report to [facility] Admitting at 630 A.M.  Call this number if you have problems the morning of surgery:  [phone]   Remember:  Do not eat food or drink liquids after midnight.  Take these medicines the morning of surgery with A SIP OF WATER Duloxetine (cymbalta) if needed, fluticasone (flonase) if needed, hydrocodone-acetaminophen (norco/vicodin) if needed, levothyroxine (synthroid/levothroid), montelukast (singulair), omeprazole (prilosec)  STOP: ALL Vitamins, Supplements, Effient and Herbal Medications, Fish Oils, Aspirins, NSAIDs (Nonsteroidal Anti-inflammatories such as Ibuprofen, Aleve, or Advil), and Goody's/BC Powders 7 days prior to surgery, until after surgery as directed by your physician. This includes: ibuprofen and naproxen, coconut oil, aspirin, multivitamin, fish oil.     Do not wear jewelry, make-up or nail polish.  Do not wear lotions, powders, or perfumes.  You may wear deodorant.  Do not shave 48 hours prior to surgery.    Do not bring valuables to the hospital.  [facility] is not responsible for any belongings or valuables.  Contacts, dentures or bridgework may not be worn into surgery.  Leave your suitcase in the car.  After surgery it may be brought to your room.  For patients admitted to the hospital, discharge time will be determined by your treatment team.  Patients discharged the day of surgery will not be allowed to drive home.        Preparing for Surgery at [facility]  Before surgery, you can play an important role.  Because skin is not sterile, your skin needs to be as free of germs as possible.  You can reduce the number of germs on your skin by washing with CHG (chlorahexidine gluconate) Soap  before surgery.  CHG is an antiseptic cleaner with kills germs and bonds with the skin to continue killing germs even after washing.   Please do not use if you have an allergy to CHG or antibacterial soaps.  If your skin becomes reddened/irritated stop using the CHG.  Do not shave (including legs and underarms) for at least 48 hours prior to first CHG shower.  It is okay to shave your face.  Please follow these instructions carefully:  1. Shower with CHG Soap the night before surgery and the morning of Surgery. 2. If you choose to wash your hair, wash your hair first as usual with your normal shampoo. 3. After you shampoo, rinse your hair and body thoroughly to remove the Shampoo. 4. Use CHG as you would any other liquid soap. You can apply chg directly to the skin and wash gently with scrungie or a clean washcloth. 5. Apply the CHG Soap to your body ONLY FROM THE NECK DOWN. Do not use on open wounds or open sores. Avoid contact with your eyes, ears, mouth and genitals (private parts). Wash genitals (private parts) with your normal soap. 6. Wash thoroughly, paying special attention to the area where your surgery will be performed. 7. Thoroughly rinse your body with warm water from the neck down. 8. DO NOT shower/wash with your normal soap after using and rinsing off the CHG Soap. 9. Pat yourself dry with a clean towel.  10. Wear clean pajamas.  11. Place clean sheets on your bed the night of your first shower and  do not sleep with pets.  Day of Surgery  Do not apply any lotions/deodorants the morning of surgery. Please wear clean clothes to the hospital/surgery center.   Please read over the following fact sheets that you were given. Pain Booklet, Coughing and Deep Breathing, Blood Transfusion Information, Total Joint Packet, MRSA Information and  Surgical Site Infection Prevention

## 2015-07-10 NOTE — Progress Notes (Signed)
Anesthesia Chart Review:  Pt is 65 year old female scheduled for R total hip arthroplasty on 07/19/2015 with Dr. Jamison Neighbor. Murphy.   Cardiologist is Dr. Peter SwazilandJordan  PMH includes:  Hyperlipidemia, hypothyroidism, post-op N/V, GERD. Former smoker. BMI 41  Medications include: ASA, levothyroxine, prilosec, crestor  Preoperative labs reviewed.    EKG 06/30/15: sinus rhythm  Echo 10/21/12:  - Left ventricle: The cavity size was normal. Systolic function was normal. The estimated ejection fraction was in the range of 55% to 60%. Wall motion was normal; there were no regional wall motion abnormalities.  Nuclear stress test 10/21/12: Normal stress nuclear study. LV Ejection Fraction: 77%. LV Wall Motion: NL LV Function; NL Wall Motion  If no changes, I anticipate pt can proceed with surgery as scheduled.   Rica Mastngela Adante Courington, FNP-BC [facility] Phone: [phone] 07/10/2015 2:24 PM

## 2015-07-18 MED ORDER — LACTATED RINGERS IV SOLN: INTRAVENOUS | Status: DC

## 2015-07-18 MED ORDER — TRANEXAMIC ACID 1000 MG/10ML IV SOLN
1000.0000 mg | INTRAVENOUS | Status: AC
  Administered 2015-07-19: 1000 mg via INTRAVENOUS
  Filled 2015-07-18: qty 10

## 2015-07-18 MED ORDER — CEFAZOLIN SODIUM-DEXTROSE 2-4 GM/100ML-% IV SOLN
2.0000 g | INTRAVENOUS | Status: AC
Start: 2015-07-19 — End: 2015-07-19
  Administered 2015-07-19: 2 g via INTRAVENOUS
  Filled 2015-07-18: qty 100

## 2015-07-18 NOTE — Anesthesia Preprocedure Evaluation (Addendum)
Anesthesia Evaluation  Patient identified by MRN, date of birth, ID band Patient awake    Reviewed: Allergy & Precautions, H&P , NPO status , Patient's Chart, lab work & pertinent test results  History of Anesthesia Complications (+) PONV  Airway Mallampati: II  TM Distance: >3 FB Neck ROM: full    Dental no notable dental hx. (+) Dental Advisory Given, Teeth Intact   Pulmonary shortness of breath and with exertion, former smoker,    Pulmonary exam normal breath sounds clear to auscultation       Cardiovascular Exercise Tolerance: Good negative cardio ROS Normal cardiovascular exam Rhythm:regular Rate:Normal  History of CP but normal stress scan in 2014   Neuro/Psych negative neurological ROS  negative psych ROS   GI/Hepatic negative GI ROS, Neg liver ROS, GERD  Medicated and Controlled,  Endo/Other  Hypothyroidism Morbid obesity  Renal/GU negative Renal ROS  negative genitourinary   Musculoskeletal   Abdominal   Peds  Hematology negative hematology ROS (+)   Anesthesia Other Findings   Reproductive/Obstetrics negative OB ROS                            Anesthesia Physical Anesthesia Plan  ASA: III  Anesthesia Plan: General   Post-op Pain Management:    Induction: Intravenous  Airway Management Planned: Oral ETT  Additional Equipment:   Intra-op Plan:   Post-operative Plan: Extubation in OR  Informed Consent: I have reviewed the patients History and Physical, chart, labs and discussed the procedure including the risks, benefits and alternatives for the proposed anesthesia with the patient or authorized representative who has indicated his/her understanding and acceptance.   Dental Advisory Given  Plan Discussed with: CRNA and Surgeon  Anesthesia Plan Comments:         Anesthesia Quick Evaluation

## 2015-07-19 ENCOUNTER — Encounter (HOSPITAL_COMMUNITY)
Admission: RE | Disposition: A | Discharge status: Home Health | Payer: Self-pay | Source: Ambulatory Visit | Attending: Orthopedic Surgery

## 2015-07-19 ENCOUNTER — Encounter (HOSPITAL_COMMUNITY): Payer: Self-pay | Admitting: *Deleted

## 2015-07-19 ENCOUNTER — Inpatient Hospital Stay (HOSPITAL_COMMUNITY): Payer: Managed Care, Other (non HMO)

## 2015-07-19 ENCOUNTER — Inpatient Hospital Stay (HOSPITAL_COMMUNITY)
Admission: RE | Admit: 2015-07-19 | Discharge: 2015-07-22 | DRG: 470 | Disposition: A | Discharge status: Home Health | Payer: Managed Care, Other (non HMO) | Source: Ambulatory Visit | Attending: Orthopedic Surgery | Admitting: Orthopedic Surgery

## 2015-07-19 ENCOUNTER — Inpatient Hospital Stay (HOSPITAL_COMMUNITY): Payer: Managed Care, Other (non HMO) | Admitting: Emergency Medicine

## 2015-07-19 ENCOUNTER — Inpatient Hospital Stay (HOSPITAL_COMMUNITY): Payer: Managed Care, Other (non HMO) | Admitting: Anesthesiology

## 2015-07-19 DIAGNOSIS — M1611 Unilateral primary osteoarthritis, right hip: Principal | ICD-10-CM | POA: Diagnosis present

## 2015-07-19 DIAGNOSIS — T402X5A Adverse effect of other opioids, initial encounter: Secondary | ICD-10-CM | POA: Diagnosis not present

## 2015-07-19 DIAGNOSIS — Z87891 Personal history of nicotine dependence: Secondary | ICD-10-CM | POA: Diagnosis not present

## 2015-07-19 DIAGNOSIS — K219 Gastro-esophageal reflux disease without esophagitis: Secondary | ICD-10-CM | POA: Diagnosis present

## 2015-07-19 DIAGNOSIS — R112 Nausea with vomiting, unspecified: Secondary | ICD-10-CM | POA: Diagnosis not present

## 2015-07-19 DIAGNOSIS — D72829 Elevated white blood cell count, unspecified: Secondary | ICD-10-CM | POA: Diagnosis present

## 2015-07-19 DIAGNOSIS — E785 Hyperlipidemia, unspecified: Secondary | ICD-10-CM | POA: Diagnosis present

## 2015-07-19 DIAGNOSIS — R011 Cardiac murmur, unspecified: Secondary | ICD-10-CM | POA: Diagnosis present

## 2015-07-19 DIAGNOSIS — M25551 Pain in right hip: Secondary | ICD-10-CM | POA: Diagnosis present

## 2015-07-19 DIAGNOSIS — Z96649 Presence of unspecified artificial hip joint: Secondary | ICD-10-CM

## 2015-07-19 DIAGNOSIS — E039 Hypothyroidism, unspecified: Secondary | ICD-10-CM | POA: Diagnosis present

## 2015-07-19 DIAGNOSIS — R339 Retention of urine, unspecified: Secondary | ICD-10-CM | POA: Diagnosis not present

## 2015-07-19 DIAGNOSIS — Z6841 Body Mass Index (BMI) 40.0 and over, adult: Secondary | ICD-10-CM | POA: Diagnosis not present

## 2015-07-19 DIAGNOSIS — D62 Acute posthemorrhagic anemia: Secondary | ICD-10-CM | POA: Diagnosis not present

## 2015-07-19 DIAGNOSIS — E860 Dehydration: Secondary | ICD-10-CM | POA: Diagnosis present

## 2015-07-19 DIAGNOSIS — D649 Anemia, unspecified: Secondary | ICD-10-CM | POA: Diagnosis present

## 2015-07-19 DIAGNOSIS — E538 Deficiency of other specified B group vitamins: Secondary | ICD-10-CM | POA: Diagnosis present

## 2015-07-19 DIAGNOSIS — M797 Fibromyalgia: Secondary | ICD-10-CM | POA: Diagnosis present

## 2015-07-19 DIAGNOSIS — Z9884 Bariatric surgery status: Secondary | ICD-10-CM

## 2015-07-19 DIAGNOSIS — K5900 Constipation, unspecified: Secondary | ICD-10-CM | POA: Diagnosis not present

## 2015-07-19 DIAGNOSIS — Z419 Encounter for procedure for purposes other than remedying health state, unspecified: Secondary | ICD-10-CM

## 2015-07-19 DIAGNOSIS — N179 Acute kidney failure, unspecified: Secondary | ICD-10-CM | POA: Diagnosis present

## 2015-07-19 DIAGNOSIS — Z9889 Other specified postprocedural states: Secondary | ICD-10-CM | POA: Diagnosis not present

## 2015-07-19 HISTORY — PX: TOTAL HIP ARTHROPLASTY: SHX124

## 2015-07-19 SURGERY — ARTHROPLASTY, HIP, TOTAL, ANTERIOR APPROACH
Anesthesia: General | Site: Hip | Laterality: Right

## 2015-07-19 MED ORDER — FENTANYL CITRATE (PF) 250 MCG/5ML IJ SOLN
INTRAMUSCULAR | Status: AC
  Filled 2015-07-19: qty 5

## 2015-07-19 MED ORDER — ROCURONIUM BROMIDE 100 MG/10ML IV SOLN
INTRAVENOUS | Status: DC | PRN
  Administered 2015-07-19: 50 mg via INTRAVENOUS
  Administered 2015-07-19: 10 mg via INTRAVENOUS
  Administered 2015-07-19: 15 mg via INTRAVENOUS

## 2015-07-19 MED ORDER — ONDANSETRON HCL 4 MG/2ML IJ SOLN
INTRAMUSCULAR | Status: DC | PRN
  Administered 2015-07-19: 4 mg via INTRAVENOUS

## 2015-07-19 MED ORDER — LACTATED RINGERS IV SOLN
INTRAVENOUS | Status: DC
Start: 2015-07-19 — End: 2015-07-19

## 2015-07-19 MED ORDER — LEVOTHYROXINE SODIUM 88 MCG PO TABS
88.0000 ug | ORAL_TABLET | Freq: Every day | ORAL | Status: DC
  Administered 2015-07-20 – 2015-07-22 (×3): 88 ug via ORAL
  Filled 2015-07-19 (×3): qty 1

## 2015-07-19 MED ORDER — POLYETHYLENE GLYCOL 3350 17 G PO PACK: 17.0000 g | PACK | Freq: Every day | ORAL | Status: DC | PRN

## 2015-07-19 MED ORDER — CELECOXIB 200 MG PO CAPS
200.0000 mg | ORAL_CAPSULE | Freq: Two times a day (BID) | ORAL | Status: DC
  Administered 2015-07-19 – 2015-07-20 (×4): 200 mg via ORAL
  Filled 2015-07-19 (×4): qty 1

## 2015-07-19 MED ORDER — CHLORHEXIDINE GLUCONATE 4 % EX LIQD: 60.0000 mL | Freq: Once | CUTANEOUS | Status: DC

## 2015-07-19 MED ORDER — BUPIVACAINE LIPOSOME 1.3 % IJ SUSP
20.0000 mL | INTRAMUSCULAR | Status: AC
  Administered 2015-07-19: 20 mL
  Filled 2015-07-19: qty 20

## 2015-07-19 MED ORDER — ACETAMINOPHEN 325 MG PO TABS: 650.0000 mg | ORAL_TABLET | Freq: Four times a day (QID) | ORAL | Status: DC | PRN

## 2015-07-19 MED ORDER — METOCLOPRAMIDE HCL 5 MG/ML IJ SOLN
5.0000 mg | Freq: Three times a day (TID) | INTRAMUSCULAR | Status: DC | PRN
  Administered 2015-07-20: 10 mg via INTRAVENOUS
  Filled 2015-07-19 (×2): qty 2

## 2015-07-19 MED ORDER — MAGNESIUM CITRATE PO SOLN
1.0000 | Freq: Once | ORAL | Status: AC | PRN
  Administered 2015-07-22: 1 via ORAL
  Filled 2015-07-19: qty 296

## 2015-07-19 MED ORDER — GLYCOPYRROLATE 0.2 MG/ML IJ SOLN
INTRAMUSCULAR | Status: DC | PRN
  Administered 2015-07-19: 0.6 mg via INTRAVENOUS

## 2015-07-19 MED ORDER — METHOCARBAMOL 500 MG PO TABS
500.0000 mg | ORAL_TABLET | Freq: Four times a day (QID) | ORAL | Status: DC | PRN
  Administered 2015-07-20 – 2015-07-22 (×4): 500 mg via ORAL
  Filled 2015-07-19 (×4): qty 1

## 2015-07-19 MED ORDER — DEXAMETHASONE SODIUM PHOSPHATE 10 MG/ML IJ SOLN
10.0000 mg | Freq: Once | INTRAMUSCULAR | Status: AC
  Administered 2015-07-20: 10 mg via INTRAVENOUS
  Filled 2015-07-19: qty 1

## 2015-07-19 MED ORDER — APIXABAN 2.5 MG PO TABS: ORAL_TABLET | ORAL | Status: DC

## 2015-07-19 MED ORDER — ALBUMIN HUMAN 5 % IV SOLN
INTRAVENOUS | Status: DC | PRN
  Administered 2015-07-19 (×2): via INTRAVENOUS

## 2015-07-19 MED ORDER — ONDANSETRON HCL 4 MG PO TABS: 4.0000 mg | ORAL_TABLET | Freq: Three times a day (TID) | ORAL | Status: DC | PRN

## 2015-07-19 MED ORDER — ESTRADIOL 1 MG PO TABS: 0.5000 mg | ORAL_TABLET | Freq: Every day | ORAL | Status: DC

## 2015-07-19 MED ORDER — ONDANSETRON HCL 4 MG/2ML IJ SOLN
4.0000 mg | Freq: Four times a day (QID) | INTRAMUSCULAR | Status: DC | PRN
  Administered 2015-07-20 (×2): 4 mg via INTRAVENOUS
  Filled 2015-07-19 (×2): qty 2

## 2015-07-19 MED ORDER — LACTATED RINGERS IV SOLN
INTRAVENOUS | Status: DC | PRN
  Administered 2015-07-19 (×2): via INTRAVENOUS

## 2015-07-19 MED ORDER — PHENYLEPHRINE 40 MCG/ML (10ML) SYRINGE FOR IV PUSH (FOR BLOOD PRESSURE SUPPORT)
PREFILLED_SYRINGE | INTRAVENOUS | Status: AC
  Filled 2015-07-19: qty 30

## 2015-07-19 MED ORDER — OXYCODONE HCL 5 MG PO TABS
5.0000 mg | ORAL_TABLET | ORAL | Status: DC | PRN
  Administered 2015-07-19 – 2015-07-20 (×3): 10 mg via ORAL
  Filled 2015-07-19 (×2): qty 2

## 2015-07-19 MED ORDER — PHENYLEPHRINE HCL 10 MG/ML IJ SOLN
INTRAMUSCULAR | Status: DC | PRN
  Administered 2015-07-19 (×2): 80 ug via INTRAVENOUS
  Administered 2015-07-19 (×3): 120 ug via INTRAVENOUS
  Administered 2015-07-19: 40 ug via INTRAVENOUS
  Administered 2015-07-19: 120 ug via INTRAVENOUS
  Administered 2015-07-19: 80 ug via INTRAVENOUS
  Administered 2015-07-19: 120 ug via INTRAVENOUS
  Administered 2015-07-19: 80 ug via INTRAVENOUS

## 2015-07-19 MED ORDER — DEXAMETHASONE SODIUM PHOSPHATE 4 MG/ML IJ SOLN
INTRAMUSCULAR | Status: DC | PRN
  Administered 2015-07-19: 8 mg via INTRAVENOUS

## 2015-07-19 MED ORDER — DEXAMETHASONE SODIUM PHOSPHATE 4 MG/ML IJ SOLN
INTRAMUSCULAR | Status: AC
  Filled 2015-07-19: qty 2

## 2015-07-19 MED ORDER — MENTHOL 3 MG MT LOZG
1.0000 | LOZENGE | OROMUCOSAL | Status: DC | PRN
  Administered 2015-07-22: 3 mg via ORAL
  Filled 2015-07-19: qty 9

## 2015-07-19 MED ORDER — OXYCODONE HCL 5 MG PO TABS
ORAL_TABLET | ORAL | Status: AC
  Administered 2015-07-19: 10 mg via ORAL
  Filled 2015-07-19: qty 2

## 2015-07-19 MED ORDER — OXYCODONE-ACETAMINOPHEN 5-325 MG PO TABS: 1.0000 | ORAL_TABLET | ORAL | Status: DC | PRN

## 2015-07-19 MED ORDER — EPHEDRINE SULFATE 50 MG/ML IJ SOLN
INTRAMUSCULAR | Status: DC | PRN
  Administered 2015-07-19 (×2): 10 mg via INTRAVENOUS

## 2015-07-19 MED ORDER — PANTOPRAZOLE SODIUM 40 MG PO TBEC
80.0000 mg | DELAYED_RELEASE_TABLET | Freq: Every day | ORAL | Status: DC
  Administered 2015-07-20 – 2015-07-22 (×3): 80 mg via ORAL
  Filled 2015-07-19 (×3): qty 2

## 2015-07-19 MED ORDER — METHOCARBAMOL 500 MG PO TABS: 500.0000 mg | ORAL_TABLET | Freq: Four times a day (QID) | ORAL | Status: DC

## 2015-07-19 MED ORDER — POTASSIUM CHLORIDE IN NACL 20-0.9 MEQ/L-% IV SOLN
INTRAVENOUS | Status: DC
  Administered 2015-07-19: via INTRAVENOUS
  Filled 2015-07-19: qty 1000

## 2015-07-19 MED ORDER — HYDROMORPHONE HCL 1 MG/ML IJ SOLN
INTRAMUSCULAR | Status: AC
  Administered 2015-07-19: 0.25 mg via INTRAVENOUS
  Filled 2015-07-19: qty 1

## 2015-07-19 MED ORDER — METHOCARBAMOL 1000 MG/10ML IJ SOLN
500.0000 mg | Freq: Four times a day (QID) | INTRAVENOUS | Status: DC | PRN
  Filled 2015-07-19: qty 5

## 2015-07-19 MED ORDER — ONDANSETRON HCL 4 MG PO TABS: 4.0000 mg | ORAL_TABLET | Freq: Four times a day (QID) | ORAL | Status: DC | PRN

## 2015-07-19 MED ORDER — ROSUVASTATIN CALCIUM 10 MG PO TABS
10.0000 mg | ORAL_TABLET | Freq: Every day | ORAL | Status: DC
  Administered 2015-07-20 – 2015-07-22 (×3): 10 mg via ORAL
  Filled 2015-07-19 (×3): qty 1

## 2015-07-19 MED ORDER — APIXABAN 2.5 MG PO TABS
2.5000 mg | ORAL_TABLET | Freq: Two times a day (BID) | ORAL | Status: DC
  Administered 2015-07-20 – 2015-07-22 (×5): 2.5 mg via ORAL
  Filled 2015-07-19 (×5): qty 1

## 2015-07-19 MED ORDER — ROCURONIUM BROMIDE 50 MG/5ML IV SOLN
INTRAVENOUS | Status: AC
  Filled 2015-07-19: qty 1

## 2015-07-19 MED ORDER — SODIUM CHLORIDE 0.9 % IV SOLN
2000.0000 mg | INTRAVENOUS | Status: AC
  Administered 2015-07-19: 2000 mg via TOPICAL
  Filled 2015-07-19: qty 20

## 2015-07-19 MED ORDER — METOCLOPRAMIDE HCL 5 MG PO TABS
5.0000 mg | ORAL_TABLET | Freq: Three times a day (TID) | ORAL | Status: DC | PRN
Start: 2015-07-19 — End: 2015-07-22
  Administered 2015-07-22: 10 mg via ORAL
  Filled 2015-07-19: qty 2

## 2015-07-19 MED ORDER — PROPOFOL 10 MG/ML IV BOLUS
INTRAVENOUS | Status: AC
  Filled 2015-07-19: qty 20

## 2015-07-19 MED ORDER — DULOXETINE HCL 60 MG PO CPEP
60.0000 mg | ORAL_CAPSULE | Freq: Every day | ORAL | Status: DC
  Administered 2015-07-20 – 2015-07-22 (×3): 60 mg via ORAL
  Filled 2015-07-19 (×3): qty 1

## 2015-07-19 MED ORDER — HYDROMORPHONE HCL 1 MG/ML IJ SOLN
INTRAMUSCULAR | Status: AC
  Administered 2015-07-19: 0.5 mg via INTRAVENOUS
  Filled 2015-07-19: qty 1

## 2015-07-19 MED ORDER — MIDAZOLAM HCL 5 MG/5ML IJ SOLN
INTRAMUSCULAR | Status: DC | PRN
  Administered 2015-07-19: 2 mg via INTRAVENOUS

## 2015-07-19 MED ORDER — PHENOL 1.4 % MT LIQD
1.0000 | OROMUCOSAL | Status: DC | PRN
  Administered 2015-07-21: 1 via OROMUCOSAL
  Filled 2015-07-19: qty 177

## 2015-07-19 MED ORDER — DOCUSATE SODIUM 100 MG PO CAPS
100.0000 mg | ORAL_CAPSULE | Freq: Two times a day (BID) | ORAL | Status: DC
  Administered 2015-07-19 – 2015-07-22 (×7): 100 mg via ORAL
  Filled 2015-07-19 (×7): qty 1

## 2015-07-19 MED ORDER — ZOLPIDEM TARTRATE 5 MG PO TABS: 5.0000 mg | ORAL_TABLET | Freq: Every evening | ORAL | Status: DC | PRN

## 2015-07-19 MED ORDER — MONTELUKAST SODIUM 10 MG PO TABS
10.0000 mg | ORAL_TABLET | Freq: Every day | ORAL | Status: DC
  Administered 2015-07-20 – 2015-07-21 (×2): 10 mg via ORAL
  Filled 2015-07-19 (×2): qty 1

## 2015-07-19 MED ORDER — BISACODYL 10 MG RE SUPP
10.0000 mg | Freq: Every day | RECTAL | Status: DC | PRN
  Administered 2015-07-22: 10 mg via RECTAL
  Filled 2015-07-19: qty 1

## 2015-07-19 MED ORDER — ONDANSETRON HCL 4 MG/2ML IJ SOLN
INTRAMUSCULAR | Status: AC
  Filled 2015-07-19: qty 2

## 2015-07-19 MED ORDER — CEFAZOLIN SODIUM-DEXTROSE 2-4 GM/100ML-% IV SOLN
2.0000 g | Freq: Four times a day (QID) | INTRAVENOUS | Status: AC
  Administered 2015-07-19 (×2): 2 g via INTRAVENOUS
  Filled 2015-07-19 (×2): qty 100

## 2015-07-19 MED ORDER — LIDOCAINE HCL (CARDIAC) 20 MG/ML IV SOLN
INTRAVENOUS | Status: AC
  Filled 2015-07-19: qty 5

## 2015-07-19 MED ORDER — HYDROMORPHONE HCL 1 MG/ML IJ SOLN
0.2500 mg | INTRAMUSCULAR | Status: DC | PRN
  Administered 2015-07-19: 0.25 mg via INTRAVENOUS
  Administered 2015-07-19 (×2): 0.5 mg via INTRAVENOUS
  Administered 2015-07-19: 0.25 mg via INTRAVENOUS
  Administered 2015-07-19: 0.5 mg via INTRAVENOUS

## 2015-07-19 MED ORDER — PROMETHAZINE HCL 25 MG RE SUPP
25.0000 mg | Freq: Four times a day (QID) | RECTAL | Status: DC | PRN
  Administered 2015-07-19: 25 mg via RECTAL
  Filled 2015-07-19: qty 1

## 2015-07-19 MED ORDER — FENTANYL CITRATE (PF) 100 MCG/2ML IJ SOLN
INTRAMUSCULAR | Status: DC | PRN
  Administered 2015-07-19: 100 ug via INTRAVENOUS
  Administered 2015-07-19: 50 ug via INTRAVENOUS
  Administered 2015-07-19: 100 ug via INTRAVENOUS
  Administered 2015-07-19 (×5): 50 ug via INTRAVENOUS

## 2015-07-19 MED ORDER — HYDROMORPHONE HCL 1 MG/ML IJ SOLN
0.5000 mg | INTRAMUSCULAR | Status: DC | PRN
  Administered 2015-07-19: 1 mg via INTRAVENOUS
  Filled 2015-07-19: qty 1

## 2015-07-19 MED ORDER — LIDOCAINE HCL (CARDIAC) 20 MG/ML IV SOLN
INTRAVENOUS | Status: DC | PRN
  Administered 2015-07-19: 60 mg via INTRAVENOUS

## 2015-07-19 MED ORDER — SODIUM CHLORIDE 0.9 % IJ SOLN
INTRAMUSCULAR | Status: DC | PRN
  Administered 2015-07-19: 40 mL via INTRAVENOUS

## 2015-07-19 MED ORDER — MIDAZOLAM HCL 2 MG/2ML IJ SOLN
INTRAMUSCULAR | Status: AC
  Filled 2015-07-19: qty 2

## 2015-07-19 MED ORDER — PROPOFOL 10 MG/ML IV BOLUS
INTRAVENOUS | Status: DC | PRN
  Administered 2015-07-19: 150 mg via INTRAVENOUS

## 2015-07-19 MED ORDER — ACETAMINOPHEN 650 MG RE SUPP: 650.0000 mg | Freq: Four times a day (QID) | RECTAL | Status: DC | PRN

## 2015-07-19 MED ORDER — NEOSTIGMINE METHYLSULFATE 10 MG/10ML IV SOLN
INTRAVENOUS | Status: DC | PRN
  Administered 2015-07-19: 4 mg via INTRAVENOUS

## 2015-07-19 MED ORDER — DIPHENHYDRAMINE HCL 12.5 MG/5ML PO ELIX: 12.5000 mg | ORAL_SOLUTION | ORAL | Status: DC | PRN

## 2015-07-19 MED ORDER — 0.9 % SODIUM CHLORIDE (POUR BTL) OPTIME
TOPICAL | Status: DC | PRN
  Administered 2015-07-19: 1000 mL

## 2015-07-19 MED ORDER — BISACODYL 5 MG PO TBEC: 5.0000 mg | DELAYED_RELEASE_TABLET | Freq: Every day | ORAL | Status: DC | PRN

## 2015-07-19 SURGICAL SUPPLY — 59 items
APL SKNCLS STERI-STRIP NONHPOA (GAUZE/BANDAGES/DRESSINGS) ×1
BENZOIN TINCTURE PRP APPL 2/3 (GAUZE/BANDAGES/DRESSINGS) ×2 IMPLANT
BLADE SAW SGTL 18X1.27X75 (BLADE) ×2 IMPLANT
BLADE SAW SGTL 18X1.27X75MM (BLADE) ×1
BLADE SURG ROTATE 9660 (MISCELLANEOUS) IMPLANT
CAPT HIP TOTAL 2 ×2 IMPLANT
CLOSURE STERI-STRIP 1/2X4 (GAUZE/BANDAGES/DRESSINGS) ×1
CLOSURE WOUND 1/2 X4 (GAUZE/BANDAGES/DRESSINGS) ×2
CLSR STERI-STRIP ANTIMIC 1/2X4 (GAUZE/BANDAGES/DRESSINGS) ×2 IMPLANT
COVER SURGICAL LIGHT HANDLE (MISCELLANEOUS) ×3 IMPLANT
DRAPE IMP U-DRAPE 54X76 (DRAPES) ×9 IMPLANT
DRAPE INCISE IOBAN 66X45 STRL (DRAPES) ×3 IMPLANT
DRAPE ORTHO SPLIT 77X108 STRL (DRAPES) ×3
DRAPE PROXIMA HALF (DRAPES) ×3 IMPLANT
DRAPE SURG 17X23 STRL (DRAPES) ×3 IMPLANT
DRAPE SURG ORHT 6 SPLT 77X108 (DRAPES) ×2 IMPLANT
DRAPE U-SHAPE 47X51 STRL (DRAPES) ×3 IMPLANT
DRSG AQUACEL AG ADV 3.5X10 (GAUZE/BANDAGES/DRESSINGS) ×3 IMPLANT
DURAPREP 26ML APPLICATOR (WOUND CARE) ×3 IMPLANT
ELECT BLADE 4.0 EZ CLEAN MEGAD (MISCELLANEOUS) ×6
ELECT CAUTERY BLADE 6.4 (BLADE) ×1 IMPLANT
ELECT REM PT RETURN 9FT ADLT (ELECTROSURGICAL) ×3
ELECTRODE BLDE 4.0 EZ CLN MEGD (MISCELLANEOUS) IMPLANT
ELECTRODE REM PT RTRN 9FT ADLT (ELECTROSURGICAL) ×1 IMPLANT
FACESHIELD WRAPAROUND (MASK) ×9 IMPLANT
FACESHIELD WRAPAROUND OR TEAM (MASK) ×2 IMPLANT
GLOVE BIOGEL PI IND STRL 7.0 (GLOVE) ×1 IMPLANT
GLOVE BIOGEL PI INDICATOR 7.0 (GLOVE) ×2
GLOVE ECLIPSE 7.0 STRL STRAW (GLOVE) IMPLANT
GLOVE ORTHO TXT STRL SZ7.5 (GLOVE) ×6 IMPLANT
GOWN STRL REUS W/ TWL LRG LVL3 (GOWN DISPOSABLE) ×3 IMPLANT
GOWN STRL REUS W/ TWL XL LVL3 (GOWN DISPOSABLE) ×1 IMPLANT
GOWN STRL REUS W/TWL LRG LVL3 (GOWN DISPOSABLE) ×9
GOWN STRL REUS W/TWL XL LVL3 (GOWN DISPOSABLE) ×3
KIT BASIN OR (CUSTOM PROCEDURE TRAY) ×3 IMPLANT
KIT ROOM TURNOVER OR (KITS) ×3 IMPLANT
MANIFOLD NEPTUNE II (INSTRUMENTS) ×3 IMPLANT
NDL SAFETY ECLIPSE 18X1.5 (NEEDLE) ×1 IMPLANT
NEEDLE HYPO 18GX1.5 SHARP (NEEDLE) ×3
NS IRRIG 1000ML POUR BTL (IV SOLUTION) ×3 IMPLANT
PACK TOTAL JOINT (CUSTOM PROCEDURE TRAY) ×3 IMPLANT
PACK UNIVERSAL I (CUSTOM PROCEDURE TRAY) ×3 IMPLANT
PAD ARMBOARD 7.5X6 YLW CONV (MISCELLANEOUS) ×6 IMPLANT
SPONGE LAP 18X18 X RAY DECT (DISPOSABLE) ×2 IMPLANT
STRIP CLOSURE SKIN 1/2X4 (GAUZE/BANDAGES/DRESSINGS) ×2 IMPLANT
SUCTION FRAZIER HANDLE 10FR (MISCELLANEOUS)
SUCTION TUBE FRAZIER 10FR DISP (MISCELLANEOUS) ×1 IMPLANT
SUT MNCRL AB 4-0 PS2 18 (SUTURE) ×3 IMPLANT
SUT VIC AB 0 CT1 27 (SUTURE) ×6
SUT VIC AB 0 CT1 27XBRD ANBCTR (SUTURE) ×1 IMPLANT
SUT VIC AB 1 CT1 27 (SUTURE)
SUT VIC AB 1 CT1 27XBRD ANBCTR (SUTURE) IMPLANT
SUT VIC AB 2-0 CT1 27 (SUTURE) ×6
SUT VIC AB 2-0 CT1 TAPERPNT 27 (SUTURE) ×2 IMPLANT
SYR 50ML LL SCALE MARK (SYRINGE) ×3 IMPLANT
TOWEL OR 17X24 6PK STRL BLUE (TOWEL DISPOSABLE) ×3 IMPLANT
TOWEL OR 17X26 10 PK STRL BLUE (TOWEL DISPOSABLE) ×3 IMPLANT
TRAY FOLEY CATH 14FR (SET/KITS/TRAYS/PACK) IMPLANT
WATER STERILE IRR 1000ML POUR (IV SOLUTION) ×3 IMPLANT

## 2015-07-19 NOTE — Interval H&P Note (Signed)
History and Physical Interval Note:  07/19/2015 8:25 AM  Haley AdieVanessa F Bendickson  has presented today for surgery, with the diagnosis of djd right hip  The various methods of treatment have been discussed with the patient and family. After consideration of risks, benefits and other options for treatment, the patient has consented to  Procedure(s): TOTAL HIP ARTHROPLASTY ANTERIOR APPROACH (Right) as a surgical intervention .  The patient's history has been reviewed, patient examined, no change in status, stable for surgery.  I have reviewed the patient's chart and labs.  Questions were answered to the patient's satisfaction.     Loreta Aveaniel F Kayte Borchard

## 2015-07-19 NOTE — H&P (View-Only) (Signed)
TOTAL HIP ADMISSION H&P  Patient is admitted for right total hip arthroplasty.  Subjective:  Chief Complaint: right hip pain  HPI: Haley Larson, 65 y.o. female, has a history of pain and functional disability in the right hip(s) due to arthritis and patient has failed non-surgical conservative treatments for greater than 12 weeks to include NSAID's and/or analgesics, corticosteriod injections and use of assistive devices.  Onset of symptoms was gradual starting 3 years ago with rapidlly worsening course since that time.The patient noted no past surgery on the right hip(s).  Patient currently rates pain in the right hip at 10 out of 10 with activity. Patient has night pain, worsening of pain with activity and weight bearing, trendelenberg gait and pain that interfers with activities of daily living. Patient has evidence of subchondral sclerosis and joint space narrowing by imaging studies. This condition presents safety issues increasing the risk of falls.  There is no current active infection.  Patient Active Problem List   Diagnosis Date Noted  . Chest pain 10/14/2012  . Dyspnea on exertion 10/14/2012  . Hyperlipidemia 10/14/2012  . Morbid obesity (HCC) 10/14/2012   Past Medical History  Diagnosis Date  . Chest pain   . H/O: hysterectomy   . Dyslipidemia   . Postmenopausal   . Morbid obesity   . Family history of early CAD     STRONG    Past Surgical History  Procedure Laterality Date  . Rotator cuff repair  04/2007  . Rotator cuff repair  08/2007  . Gastric bypass  1977     (Not in a hospital admission) No Known Allergies  Social History  Substance Use Topics  . Smoking status: Former Games developer  . Smokeless tobacco: Not on file  . Alcohol Use: No    Family History  Problem Relation Age of Onset  . Other Father     infection   . Coronary artery disease Mother     with stents  . Atrial fibrillation Mother     with pacemaker  . Heart failure Mother   . Diabetes  Mother   . Heart attack Brother 38    massive  MI  . Heart attack Brother 54    x3  . Heart attack Brother 52    with stent  . Coronary artery disease Brother     with stent  . Alcohol abuse Brother   . Coronary artery disease Brother     with stents  . Heart attack Brother      Review of Systems  Constitutional: Negative.   HENT: Negative.   Eyes: Negative.   Respiratory: Negative.   Cardiovascular: Negative.   Gastrointestinal: Negative.   Genitourinary: Negative.   Musculoskeletal: Positive for joint pain.  Skin: Negative.   Neurological: Negative.   Endo/Heme/Allergies: Negative.   Psychiatric/Behavioral: Negative.     Objective:  Physical Exam  Constitutional: She is oriented to person, place, and time. She appears well-developed and well-nourished.  HENT:  Head: Normocephalic and atraumatic.  Eyes: EOM are normal. Pupils are equal, round, and reactive to light.  Neck: Normal range of motion. Neck supple.  Cardiovascular: Normal rate and regular rhythm.   Respiratory: Effort normal and breath sounds normal.  GI: Soft. Bowel sounds are normal.  Musculoskeletal:  Examination of her right hip reveals markedly positive log roll with decreased internal rotation.  Minimal tenderness over the greater trochanter.  Negative straight leg raise.  She is neurovascularly intact distally  Neurological: She is alert and  oriented to person, place, and time.  Skin: Skin is warm and dry.  Psychiatric: She has a normal mood and affect. Her behavior is normal. Judgment and thought content normal.    Vital signs in last 24 hours: @VSRANGES @  Labs:   Estimated body mass index is 43.59 kg/(m^2) as calculated from the following:   Height as of 11/14/14: 5\' 3"  (1.6 m).   Weight as of 11/14/14: 111.585 kg (246 lb).   Imaging Review Plain radiographs demonstrate severe degenerative joint disease of the right hip(s). The bone quality appears to be fair for age and reported activity  level.  Assessment/Plan:  End stage arthritis, right hip(s)  The patient history, physical examination, clinical judgement of the provider and imaging studies are consistent with end stage degenerative joint disease of the right hip(s) and total hip arthroplasty is deemed medically necessary. The treatment options including medical management, injection therapy, arthroscopy and arthroplasty were discussed at length. The risks and benefits of total hip arthroplasty were presented and reviewed. The risks due to aseptic loosening, infection, stiffness, dislocation/subluxation,  thromboembolic complications and other imponderables were discussed.  The patient acknowledged the explanation, agreed to proceed with the plan and consent was signed. Patient is being admitted for inpatient treatment for surgery, pain control, PT, OT, prophylactic antibiotics, VTE prophylaxis, progressive ambulation and ADL's and discharge planning.The patient is planning to be discharged home with home health services

## 2015-07-19 NOTE — Anesthesia Postprocedure Evaluation (Signed)
Anesthesia Post Note  Patient: Janifer AdieVanessa F Milberger  Procedure(s) Performed: Procedure(s) (LRB): TOTAL HIP ARTHROPLASTY ANTERIOR APPROACH (Right)  Patient location during evaluation: PACU Anesthesia Type: General Level of consciousness: awake and alert Pain management: pain level controlled Vital Signs Assessment: post-procedure vital signs reviewed and stable Respiratory status: spontaneous breathing, nonlabored ventilation, respiratory function stable and patient connected to nasal cannula oxygen Cardiovascular status: blood pressure returned to baseline and stable Postop Assessment: no signs of nausea or vomiting Anesthetic complications: no    Last Vitals:  Filed Vitals:   07/19/15 1130 07/19/15 1145  BP: 101/46 99/57  Pulse: 74 67  Temp:    Resp: 12 17    Last Pain:  Filed Vitals:   07/19/15 1150  PainSc: Asleep                 Miela Desjardin L

## 2015-07-19 NOTE — Transfer of Care (Signed)
Immediate Anesthesia Transfer of Care Note  Patient: Haley Larson  Procedure(s) Performed: Procedure(s): TOTAL HIP ARTHROPLASTY ANTERIOR APPROACH (Right)  Patient Location: PACU  Anesthesia Type:General  Level of Consciousness: awake, alert , oriented and patient cooperative  Airway & Oxygen Therapy: Patient Spontanous Breathing and Patient connected to nasal cannula oxygen  Post-op Assessment: Report given to RN and Post -op Vital signs reviewed and stable  Post vital signs: Reviewed and stable  Last Vitals:  Filed Vitals:   07/19/15 0653 07/19/15 1113  BP: 109/49 100/44  Pulse: 71   Temp: 36.9 C 36.6 C  Resp: 20 17    Last Pain:  Filed Vitals:   07/19/15 1117  PainSc: 7       Patients Stated Pain Goal: 3 (07/19/15 0705)  Complications: No apparent anesthesia complications

## 2015-07-19 NOTE — Discharge Summary (Addendum)
Patient ID: Haley Larson MRN: [id number] DOB/AGE: [birth year] 65 y.o.  Admit date: 07/19/2015 Discharge date: 07/20/2015  Admission Diagnoses:  Active Problems:   Status post THR (total hip replacement)   Discharge Diagnoses:  Same  Past Medical History  Diagnosis Date  . Chest pain   . H/O: hysterectomy   . Dyslipidemia   . Postmenopausal   . Morbid obesity (HCC)   . Family history of early CAD     STRONG  . PONV (postoperative nausea and vomiting)   . Seasonal allergies   . Hypothyroidism   . Fibromyalgia   . GERD (gastroesophageal reflux disease)   . Arthritis     Surgeries: Procedure(s): TOTAL HIP ARTHROPLASTY ANTERIOR APPROACH on 07/19/2015   Consultants:    Discharged Condition: Improved  Hospital Course: Haley Larson is an 65 y.o. female who was admitted 07/19/2015 for operative treatment of right hip osteoarthritis. Patient has severe unremitting pain that affects sleep, daily activities, and work/hobbies. After pre-op clearance the patient was taken to the operating room on 07/19/2015 and underwent  Procedure(s): TOTAL HIP ARTHROPLASTY ANTERIOR APPROACH.  Patient with a pre-op Hb of 13.1 developed ABLA on pod#1 with a  Hb of 7.5.  She is symptomatic and we will transfuse with 2 units of prbc.  Patient was given perioperative antibiotics:      Anti-infectives    Start     Dose/Rate Route Frequency Ordered Stop   07/19/15 1400  ceFAZolin (ANCEF) IVPB 2g/100 mL premix     2 g 200 mL/hr over 30 Minutes Intravenous Every 6 hours 07/19/15 1104 07/19/15 2211   07/19/15 0800  ceFAZolin (ANCEF) IVPB 2g/100 mL premix     2 g 200 mL/hr over 30 Minutes Intravenous To ShortStay [facility] 07/18/15 1023 07/19/15 0843       Patient was given sequential compression devices, early ambulation, and chemoprophylaxis to prevent DVT.  Patient benefited maximally from hospital stay and there were no complications.    Recent vital signs:  Patient Vitals for the past 24  hrs:  BP Temp Temp src Pulse Resp SpO2  07/20/15 0500 (!) 90/55 mmHg 98.1 F (36.7 C) Oral (!) 125 14 95 %  07/19/15 2300 (!) 111/49 mmHg 97.9 F (36.6 C) Oral 93 14 95 %  07/19/15 2031 (!) 94/38 mmHg 97.5 F (36.4 C) Oral (!) 102 17 92 %  07/19/15 1339 (!) 95/57 mmHg - - 93 17 98 %  07/19/15 1320 - - - 81 17 99 %  07/19/15 1315 - 98.5 F (36.9 C) - 80 18 99 %  07/19/15 1300 (!) 94/47 mmHg - - 80 17 99 %  07/19/15 1245 (!) 95/56 mmHg - - 80 16 99 %  07/19/15 1230 (!) 95/55 mmHg - - 79 16 99 %  07/19/15 1215 (!) 96/51 mmHg - - 81 17 99 %  07/19/15 1200 (!) 103/47 mmHg - - 82 10 100 %  07/19/15 1145 (!) 99/57 mmHg - - 67 17 100 %  07/19/15 1130 (!) 101/46 mmHg - - 74 12 100 %  07/19/15 1113 (!) 100/44 mmHg 97.9 F (36.6 C) - 82 17 100 %     Recent laboratory studies:   Recent Labs  07/20/15 0617  WBC 13.1*  HGB 7.5*  HCT 22.6*  PLT 222     Discharge Medications:     Medication List    STOP taking these medications        ALEVE 220 MG tablet  Generic drug:  naproxen sodium     aspirin 81 MG tablet     Coconut Oil 1000 MG Caps     ibuprofen 200 MG tablet  Commonly known as:  ADVIL,MOTRIN     Omega 3 1000 MG Caps      TAKE these medications        apixaban 2.5 MG Tabs tablet  Commonly known as:  ELIQUIS  Take 1 tab po q12 hours x 35 days following surgery to prevent blood clots     bisacodyl 5 MG EC tablet  Commonly known as:  DULCOLAX  Take 1 tablet (5 mg total) by mouth daily as needed for moderate constipation.     DULoxetine 60 MG capsule  Commonly known as:  CYMBALTA  Take 60 mg by mouth daily.     estradiol 0.5 MG tablet  Commonly known as:  ESTRACE  Take 1 tablet by mouth daily.     fluticasone 50 MCG/ACT nasal spray  Commonly known as:  FLONASE  Place 1 spray into the nose daily as needed for allergies.     HYDROcodone-acetaminophen 5-325 MG tablet  Commonly known as:  NORCO  Take 1-2 tablets by mouth every 4 (four) hours as needed for  moderate pain.     levothyroxine 88 MCG tablet  Commonly known as:  SYNTHROID, LEVOTHROID  Take 88 mcg by mouth daily before breakfast.     methocarbamol 500 MG tablet  Commonly known as:  ROBAXIN  Take 1 tablet (500 mg total) by mouth 4 (four) times daily.     montelukast 10 MG tablet  Commonly known as:  SINGULAIR  Take 1 tablet by mouth at bedtime.     multivitamin tablet  Take 1 tablet by mouth daily.     omeprazole 40 MG capsule  Commonly known as:  PRILOSEC  Take 1 capsule by mouth daily as needed.     ondansetron 4 MG tablet  Commonly known as:  ZOFRAN  Take 1 tablet (4 mg total) by mouth every 8 (eight) hours as needed for nausea or vomiting.     rosuvastatin 10 MG tablet  Commonly known as:  CRESTOR  Take 10 mg by mouth daily.        Diagnostic Studies: Dg Hip Operative Unilat With Pelvis Right  07/19/2015  CLINICAL DATA:  Postop right total hip arthroplasty. EXAM: OPERATIVE RIGHT HIP (WITH PELVIS IF PERFORMED) 2 VIEWS TECHNIQUE: Fluoroscopic spot image(s) were submitted for interpretation post-operatively. COMPARISON:  Radiographs 07/08/2014 FINDINGS: Spot AP fluoroscopic images of the right hip and symphysis pubis demonstrate interval right total hip arthroplasty with a screw fixed acetabular component. The hardware appears well positioned. No evidence of acute fracture or dislocation on single plane imaging. IMPRESSION: No demonstrated complication following right total hip arthroplasty. Electronically Signed   By: Carey BullocksWilliam  Veazey M.D.   On: 07/19/2015 10:31    Disposition: 01-Home or Self Care    Follow-up Information    Follow up with Loreta Aveaniel F Murphy, MD. Schedule an appointment as soon as possible for a visit in 2 weeks.   Specialty:  Orthopedic Surgery   Contact information:   [address] [place] [address] [phone]        Signed: Otilio SaberM Lindsey Laporsha Grealish 07/20/2015, 7:12 AM

## 2015-07-19 NOTE — Op Note (Signed)
NAME:  Haley Larson, Haley Larson              ACCOUNT NO.:  [id number]  MEDICAL RECORD NO.:  [id number]  LOCATION:  [id number]                        FACILITY:  [facility]  PHYSICIAN:  Loreta Aveaniel F. Kamaria Lucia, M.D. DATE OF BIRTH:  Nov 05, 1950  DATE OF PROCEDURE: DATE OF DISCHARGE:                              OPERATIVE REPORT   PREOPERATIVE DIAGNOSIS:  Right hip end-stage arthritis, primary localized.  Morbid obesity.  POSTOPERATIVE DIAGNOSIS:  Right hip end-stage arthritis, primary localized.  Morbid obesity.  PROCEDURE:  Direct anterior right total hip replacement utilizing Stryker prosthesis.  A press-fit 52 mm acetabular component.  Screw fixation x2.  A 36 mm internal diameter liner.  Press-fit #3 Accolade stem 36+ 0 BIOLOX head.  SURGEON:  Loreta Aveaniel F. Gayle Collard, MD  ASSISTANT:  Mikey KirschnerLindsey Stanberry, PA, present throughout the entire case and necessary for timely completion of procedure.  ANESTHESIA:  General.  BLOOD LOSS:  400 mL.  BLOOD GIVEN:  None.  SPECIMENS:  None.  CULTURES:  None.  COMPLICATIONS:  None.  DRESSING:  Soft compressive.  PROCEDURE IN DETAIL:  Patient was brought to the operating room, placed on the operating table in supine position.  After adequate anesthesia had been obtained, placed on the Hana bed.  Appropriate, prepped and draped in usual sterile fashion.  Everything more difficult because of her morbid obesity.  Direct anterior approach, longitudinal incision anteriorly.  Skin and subcutaneous tissue was divided.  Fascia over the tensor incised.  Muscle retracted.  Capsule exposed, excised.  Femoral neck exposed, cut 1 fingerbreadth above the lesser trochanter with the napkin-ring, the head in the napkin-ring removed.  Acetabulum exposed. Marked destructive changes.  Brought up to good bleeding bone, sized and fitted for a 52 mm component, fluoroscopic guidance.  Placed at 40 degrees of abduction, there was slight anteversion.  Good capturing fixation augmented with 2  screws through the cup.  36 mm internal diameter liner.  Femur was then freed up and exposed utilizing the Hana bed to allow for access.  Fitted with a #3 press-fit Accolade stem. After trials, I used a 36+ 0 head.  With this construct, I had good alignment, equal leg lengths, great stability confirmed visually and fluoroscopically.  Wound irrigated, injected with Exparel.  Fascia closed with Vicryl.  Subcutaneous and subcuticular closure.  Margins were injected with Marcaine.  Sterile compressive dressing applied. Taken off the [place] [employer] bed.  Anesthesia reversed.  Brought to the recovery room.  Tolerated the surgery well, no complications.     Loreta Aveaniel F. Ridge Lafond, M.D.     DFM/MEDQ  D:  07/19/2015  T:  07/19/2015  Job:  [id number]

## 2015-07-19 NOTE — Discharge Instructions (Signed)

## 2015-07-19 NOTE — Evaluation (Signed)
Physical Therapy Evaluation Patient Details Name: Haley Larson MRN: [id number] DOB: 1951-03-17 Today's Date: 07/19/2015   History of Present Illness  R THR; Hx of gastric bypass, RCR and fibromyalgia  Clinical Impression  Pt s/p R THR presents with decreased R LE strength/ROM and post op pain limiting functional mobility.  Pt should progress to dc home with family assist and HHPT follow up.    Follow Up Recommendations Home health PT    Equipment Recommendations  None recommended by PT    Recommendations for Other Services OT consult     Precautions / Restrictions Precautions Precautions: Fall Precaution Comments: Diziness and N&V on eval Restrictions Weight Bearing Restrictions: No RLE Weight Bearing: Weight bearing as tolerated      Mobility  Bed Mobility Overal bed mobility: Needs Assistance Bed Mobility: Supine to Sit     Supine to sit: HOB elevated;Min assist;Mod assist     General bed mobility comments: Increased time, cues for sequence and use of L LE to self assist  Transfers Overall transfer level: Needs assistance Equipment used: Rolling walker (2 wheeled) Transfers: Sit to/from Stand Sit to Stand: Min assist         General transfer comment: cues for LE management and use of UEs to self assist  Ambulation/Gait Ambulation/Gait assistance: Min assist;Mod assist Ambulation Distance (Feet): 6 Feet Assistive device: Rolling walker (2 wheeled) Gait Pattern/deviations: Step-to pattern;Decreased step length - right;Decreased step length - left;Shuffle Gait velocity: decr Gait velocity interpretation: Below normal speed for age/gender General Gait Details: cues for sequence, posture and position from RW; Limited by nausea dn c/o dizziness  Stairs            Wheelchair Mobility    Modified Rankin (Stroke Patients Only)       Balance                                             Pertinent Vitals/Pain Pain Assessment:  0-10 Pain Score: 3  Pain Location: R hip Pain Descriptors / Indicators: Aching;Sore Pain Intervention(s): Monitored during session;Limited activity within patient's tolerance    Home Living Family/patient expects to be discharged to:: Private residence Living Arrangements: Spouse/significant other Available Help at Discharge: Family Type of Home: House Home Access: Stairs to enter Entrance Stairs-Rails: Lawyer of Steps: 4 Home Layout: One level Home Equipment: Environmental consultant - 2 wheels;Cane - single point      Prior Function Level of Independence: Independent with assistive device(s)         Comments: used cane     Hand Dominance        Extremity/Trunk Assessment   Upper Extremity Assessment: Overall WFL for tasks assessed           Lower Extremity Assessment: RLE deficits/detail      Cervical / Trunk Assessment: Normal  Communication   Communication: No difficulties  Cognition Arousal/Alertness: Awake/alert Behavior During Therapy: WFL for tasks assessed/performed Overall Cognitive Status: Within Functional Limits for tasks assessed                      General Comments      Exercises Total Joint Exercises Ankle Circles/Pumps: AROM;Both;15 reps;Supine      Assessment/Plan    PT Assessment Patient needs continued PT services  PT Diagnosis Difficulty walking   PT Problem List Decreased strength;Decreased range of motion;Decreased  activity tolerance;Decreased mobility;Decreased knowledge of use of DME;Pain;Obesity  PT Treatment Interventions DME instruction;Gait training;Stair training;Functional mobility training;Therapeutic activities;Therapeutic exercise;Patient/family education   PT Goals (Current goals can be found in the Care Plan section) Acute Rehab PT Goals Patient Stated Goal: Walk without pain PT Goal Formulation: With patient Time For Goal Achievement: 07/22/15 Potential to Achieve Goals: Good    Frequency  7X/week   Barriers to discharge        Co-evaluation               End of Session Equipment Utilized During Treatment: Gait belt Activity Tolerance: Patient tolerated treatment well Patient left: in chair;with nursing/sitter in room;with family/visitor present Nurse Communication: Mobility status         Time: [id number] PT Time Calculation (min) (ACUTE ONLY): 23 min   Charges:   PT Evaluation $PT Eval Low Complexity: 1 Procedure PT Treatments $Gait Training: 8-22 mins   PT G Codes:        Qiana Landgrebe 07/19/2015, 4:56 PM

## 2015-07-20 ENCOUNTER — Encounter (HOSPITAL_COMMUNITY): Payer: Self-pay | Admitting: Orthopedic Surgery

## 2015-07-20 LAB — PREPARE RBC (CROSSMATCH)

## 2015-07-20 LAB — CBC
HCT: 29.1 % — ABNORMAL LOW (ref 36.0–46.0)
HEMATOCRIT: 22.6 % — AB (ref 36.0–46.0)
Hemoglobin: 7.5 g/dL — ABNORMAL LOW (ref 12.0–15.0)
Hemoglobin: 9.7 g/dL — ABNORMAL LOW (ref 12.0–15.0)
MCH: 30.7 pg (ref 26.0–34.0)
MCH: 29.8 pg (ref 26.0–34.0)
MCHC: 33.2 g/dL (ref 30.0–36.0)
MCHC: 33.3 g/dL (ref 30.0–36.0)
MCV: 92.6 fL (ref 78.0–100.0)
MCV: 89.5 fL (ref 78.0–100.0)
PLATELETS: 222 10*3/uL (ref 150–400)
Platelets: 165 10*3/uL (ref 150–400)
RBC: 2.44 MIL/uL — ABNORMAL LOW (ref 3.87–5.11)
RBC: 3.25 MIL/uL — ABNORMAL LOW (ref 3.87–5.11)
RDW: 12.5 % (ref 11.5–15.5)
RDW: 13.3 % (ref 11.5–15.5)
WBC: 13.1 10*3/uL — ABNORMAL HIGH (ref 4.0–10.5)
WBC: 13.1 10*3/uL — ABNORMAL HIGH (ref 4.0–10.5)

## 2015-07-20 LAB — BASIC METABOLIC PANEL
Anion gap: 11 (ref 5–15)
BUN: 29 mg/dL — ABNORMAL HIGH (ref 6–20)
CO2: 21 mmol/L — ABNORMAL LOW (ref 22–32)
Calcium: 8 mg/dL — ABNORMAL LOW (ref 8.9–10.3)
Chloride: 105 mmol/L (ref 101–111)
Creatinine, Ser: 1.79 mg/dL — ABNORMAL HIGH (ref 0.44–1.00)
GFR calc Af Amer: 34 mL/min — ABNORMAL LOW (ref >60)
GFR calc non Af Amer: 29 mL/min — ABNORMAL LOW (ref >60)
Glucose, Bld: 155 mg/dL — ABNORMAL HIGH (ref 65–99)
Potassium: 5.9 mmol/L — ABNORMAL HIGH (ref 3.5–5.1)
Sodium: 137 mmol/L (ref 135–145)

## 2015-07-20 MED ORDER — HYDROCODONE-ACETAMINOPHEN 5-325 MG PO TABS
1.0000 | ORAL_TABLET | ORAL | Status: DC | PRN
Start: 2015-07-20 — End: 2015-07-22
  Administered 2015-07-20 – 2015-07-22 (×7): 2 via ORAL
  Filled 2015-07-20 (×7): qty 2

## 2015-07-20 MED ORDER — FUROSEMIDE 10 MG/ML IJ SOLN
20.0000 mg | Freq: Once | INTRAMUSCULAR | Status: AC
  Administered 2015-07-20: 20 mg via INTRAVENOUS
  Filled 2015-07-20: qty 2

## 2015-07-20 MED ORDER — SODIUM CHLORIDE 0.9 % IV SOLN
INTRAVENOUS | Status: DC
  Administered 2015-07-21: 01:00:00 via INTRAVENOUS

## 2015-07-20 MED ORDER — HYDROCODONE-ACETAMINOPHEN 5-325 MG PO TABS: 1.0000 | ORAL_TABLET | ORAL | Status: DC | PRN

## 2015-07-20 MED ORDER — SODIUM CHLORIDE 0.9 % IV SOLN
Freq: Once | INTRAVENOUS | Status: DC
  Administered 2015-07-20: 09:00:00 via INTRAVENOUS

## 2015-07-20 NOTE — Progress Notes (Signed)
Physical Therapy Treatment Patient Details Name: Haley Larson MRN: [id number] DOB: 1951-01-25 Today's Date: 07/20/2015    History of Present Illness R THR; Hx of gastric bypass, RCR and fibromyalgia    PT Comments    Pt with low HGB receiving blood transfusion.  Pt with dizziness noted after gait trial follow by vomiting.  RN informed and aware.   PT tolerated therapeutic exercise well.  Pt required cues for safety.  Will f/u this pm to advance mobility.    Follow Up Recommendations  Home health PT     Equipment Recommendations  None recommended by PT    Recommendations for Other Services OT consult     Precautions / Restrictions Precautions Precautions: Fall Precaution Comments: Diziness and N&V on eval Restrictions Weight Bearing Restrictions: Yes RLE Weight Bearing: Weight bearing as tolerated    Mobility  Bed Mobility Overal bed mobility: Needs Assistance Bed Mobility: Supine to Sit     Supine to sit: Min assist;HOB elevated     General bed mobility comments: Increased time, cues for sequence and assist to advance LLE to edge of bed.  Pt with noticeable grimmacing during knee flexion on R at Bed side.    Transfers Overall transfer level: Needs assistance Equipment used: Rolling walker (2 wheeled) Transfers: Sit to/from Stand Sit to Stand: Min assist         General transfer comment: cues for LE management and use of UEs to self assist  Ambulation/Gait Ambulation/Gait assistance: Min assist Ambulation Distance (Feet): 16 Feet Assistive device: Rolling walker (2 wheeled) Gait Pattern/deviations: Step-to pattern;Shuffle;Decreased stride length Gait velocity: decr   General Gait Details: cues for sequence, posture and position from RW; Limited by nausea/ vomitting & c/o dizziness.  Pt with low HGb receiving blood transfusion.     Stairs            Wheelchair Mobility    Modified Rankin (Stroke Patients Only)       Balance                                     Cognition Arousal/Alertness: Awake/alert Behavior During Therapy: WFL for tasks assessed/performed Overall Cognitive Status: Within Functional Limits for tasks assessed                      Exercises Total Joint Exercises Ankle Circles/Pumps: AROM;Both;15 reps;Supine Quad Sets: AROM;Both;10 reps;Supine Gluteal Sets: AROM;Both;10 reps;Supine Heel Slides: AROM;Right;10 reps;Supine;AAROM Hip ABduction/ADduction: AROM;AAROM;Right;10 reps;Supine Straight Leg Raises: AROM;AAROM;Right;10 reps;Supine    General Comments        Pertinent Vitals/Pain Pain Assessment: 0-10 Pain Score: 4  Pain Location: R hip Pain Descriptors / Indicators: Aching;Sore;Tightness Pain Intervention(s): Monitored during session;Repositioned    Home Living                      Prior Function            PT Goals (current goals can now be found in the care plan section) Acute Rehab PT Goals Patient Stated Goal: Walk without pain Potential to Achieve Goals: Good Progress towards PT goals: Progressing toward goals    Frequency  7X/week    PT Plan      Co-evaluation             End of Session Equipment Utilized During Treatment: Gait belt Activity Tolerance: Patient tolerated treatment well Patient left: in chair;with  nursing/sitter in room;with family/visitor present     Time: [id number] PT Time Calculation (min) (ACUTE ONLY): 23 min  Charges:  $Gait Training: 8-22 mins $Therapeutic Exercise: 8-22 mins                    G Codes:      Haley Larson 07/20/2015, 10:26 AM  Haley Larson, PTA pager [phone]

## 2015-07-20 NOTE — Progress Notes (Signed)
OT Cancellation Note  Patient Details Name: Janifer AdieVanessa F Filsaime MRN: [id number] DOB: 08/17/1950   Cancelled Treatment:    Reason Eval/Treat Not Completed: Medical issues which prohibited therapy.  Noted plan is for 1 unite PRBCs; Hgb 7.5 and pt is symptomatic. Will try to check back later today if schedule permits.    Jalacia Mattila 07/20/2015, 7:36 AM  Marica OtterMaryellen Ky Moskowitz, OTR/L [phone] 07/20/2015

## 2015-07-20 NOTE — Care Management Note (Signed)
Case Management Note  Patient Details  Name: Haley Larson MRN: [id number] Date of Birth: 06/09/1950  Subjective/Objective:            S/p right total hip arthroplasty        Action/Plan: [facility]. Spoke with patient and her husband. No change in discharge plan. Patient stated that she has a rolling walker and 3N1 at home, husband will be assisting her after discharge.   Expected Discharge Date:                  Expected Discharge Plan:  Home w Home Health Services  In-House Referral:  NA  Discharge planning Services  CM Consult  Post Acute Care Choice:  Home Health Choice offered to:  Patient  DME Arranged:  N/A DME Agency:     HH Arranged:  PT [facility]  Status of Service:  Completed, signed off  Medicare Important Message Given:    Date Medicare IM Given:    Medicare IM give by:    Date Additional Medicare IM Given:    Additional Medicare Important Message give by:     If discussed at Long Length of Stay Meetings, dates discussed:    Additional Comments:  Monica BectonKrieg, [place]ase[place] Watson, RN 07/20/2015, 10:57 AM

## 2015-07-20 NOTE — Progress Notes (Signed)
Subjective: 1 Day Post-Op Procedure(s) (LRB): TOTAL HIP ARTHROPLASTY ANTERIOR APPROACH (Right) Patient reports pain as mild.  Nausea/vomiting with oxy.  Lightheadedness/dizziness when standing.  No chest pain/sob.  Objective: Vital signs in last 24 hours: Temp:  [97.5 F (36.4 C)-98.5 F (36.9 C)] 98.1 F (36.7 C) (04/27 0500) Pulse Rate:  [67-125] 125 (04/27 0500) Resp:  [10-18] 14 (04/27 0500) BP: (90-111)/(38-57) 90/55 mmHg (04/27 0500) SpO2:  [92 %-100 %] 95 % (04/27 0500)  Intake/Output from previous day: 04/26 0701 - 04/27 0700 In: 2850 [I.V.:2350; IV Piggyback:500] Out: 1100 [Blood:1100] Intake/Output this shift:     Recent Labs  07/20/15 0617  HGB 7.5*    Recent Labs  07/20/15 0617  WBC 13.1*  RBC 2.44*  HCT 22.6*  PLT 222   No results for input(s): NA, K, CL, CO2, BUN, CREATININE, GLUCOSE, CALCIUM in the last 72 hours. No results for input(s): LABPT, INR in the last 72 hours.  Neurologically intact Neurovascular intact Sensation intact distally Intact pulses distally Dorsiflexion/Plantar flexion intact Incision: dressing C/D/I Compartment soft  Assessment/Plan: 1 Day Post-Op Procedure(s) (LRB): TOTAL HIP ARTHROPLASTY ANTERIOR APPROACH (Right) Advance diet Up with therapy D/C IV fluids Discharge home with home health either today or tomorrow depending on how well she progresses with PT ABLA-7.5 and symptomatic this am.  Will transfuse with 1 unit prbc WBAT RLE-anterior hip precautions Dry dressing change prn Will d/c oxy and add norco d/t nausea/vomiting from oxy  Otilio SaberM Lindsey Stanbery 07/20/2015, 7:13 AM

## 2015-07-20 NOTE — Progress Notes (Signed)
Physical Therapy Treatment Patient Details Name: Haley Larson MRN: [id number] DOB: Jan 16, 1951 Today's Date: 07/20/2015    History of Present Illness R THR; Hx of gastric bypass, RCR and fibromyalgia    PT Comments    Pt performed increased gait distance, min guard/supervision.  Pt required cues for safety, no signs of dizziness and negative for nausea.  Will perform stair training in am and review of standing hip therapeutic exercises.  Pt will likely d/c home in am.    Follow Up Recommendations  Home health PT     Equipment Recommendations  None recommended by PT    Recommendations for Other Services       Precautions / Restrictions Precautions Precautions: Fall Restrictions Weight Bearing Restrictions: Yes RLE Weight Bearing: Weight bearing as tolerated    Mobility  Bed Mobility Overal bed mobility: Needs Assistance Bed Mobility: Supine to Sit     Supine to sit: HOB elevated;Supervision Sit to supine: Supervision   General bed mobility comments: Cues for technique to improve ease.  Use of railing will practice without railing next intervention.    Transfers Overall transfer level: Needs assistance Equipment used: Rolling walker (2 wheeled) Transfers: Sit to/from Stand Sit to Stand: Supervision         General transfer comment: light assistance to rise and steady. cues for UE/LE placement  Ambulation/Gait Ambulation/Gait assistance: Supervision;Min guard Ambulation Distance (Feet): 400 Feet Assistive device: Rolling walker (2 wheeled) Gait Pattern/deviations: Step-through pattern;Trunk flexed Gait velocity: decr   General Gait Details: cues for sequence, posture and position from RW: LOb noted with head turns. pt educated to maintain forward gaze to improve safety.     Stairs            Wheelchair Mobility    Modified Rankin (Stroke Patients Only)       Balance                                    Cognition  Arousal/Alertness: Awake/alert Behavior During Therapy: WFL for tasks assessed/performed Overall Cognitive Status: Within Functional Limits for tasks assessed                      Exercises Total Joint Exercises Ankle Circles/Pumps: AROM;Both;15 reps;Supine Quad Sets: AROM;Both;10 reps;Supine Gluteal Sets: AROM;Both;10 reps;Supine Heel Slides: AROM;Right;10 reps;Supine;AAROM Hip ABduction/ADduction: AROM;AAROM;Right;10 reps;Supine Straight Leg Raises: AROM;AAROM;Right;10 reps;Supine    General Comments        Pertinent Vitals/Pain Pain Assessment: 0-10 Pain Score: 4  Pain Location: R hip and thigh Pain Descriptors / Indicators: Burning Pain Intervention(s): Monitored during session;Repositioned;Ice applied    Home Living Family/patient expects to be discharged to:: Private residence Living Arrangements: Spouse/significant other Available Help at Discharge: Family         Home Equipment: Bedside commode      Prior Function Level of Independence: Independent with assistive device(s)          PT Goals (current goals can now be found in the care plan section) Acute Rehab PT Goals Patient Stated Goal: Walk without pain Potential to Achieve Goals: Good Progress towards PT goals: Progressing toward goals    Frequency  7X/week    PT Plan      Co-evaluation             End of Session Equipment Utilized During Treatment: Gait belt Activity Tolerance: Patient tolerated treatment well Patient left: in chair;with nursing/sitter  in room;with family/visitor present     Time: [phone] PT Time Calculation (min) (ACUTE ONLY): 29 min  Charges:  $Gait Training: 8-22 mins $Therapeutic Exercise: 8-22 mins                    G Codes:      Haley Larson 07/20/2015, 4:35 PM Haley Larson, PTA pager [phone]]

## 2015-07-20 NOTE — Evaluation (Signed)
Occupational Therapy Evaluation Patient Details Name: Haley Larson MRN: [id number] DOB: October 26, 1950 Today's Date: 07/20/2015    History of Present Illness R THR; Hx of gastric bypass, RCR and fibromyalgia   Clinical Impression   This 65 year old female was admitted for the above surgery. She will have help for adls at home but will benefit from one more OT session in acute setting to practice shower transfer and increase independence with standing grooming and toileting.      Follow Up Recommendations  Supervision/Assistance - 24 hour    Equipment Recommendations  None recommended by OT    Recommendations for Other Services       Precautions / Restrictions Precautions Precautions: Fall Restrictions RLE Weight Bearing: Weight bearing as tolerated      Mobility Bed Mobility           Sit to supine: Min assist   General bed mobility comments: assist for legs  Transfers   Equipment used: Rolling walker (2 wheeled) Transfers: Sit to/from Stand Sit to Stand: Min assist         General transfer comment: light assistance to rise and steady. cues for UE/LE placement    Balance                                            ADL Overall ADL's : Needs assistance/impaired             Lower Body Bathing: Moderate assistance;Sit to/from stand       Lower Body Dressing: Maximal assistance;Sit to/from stand   Toilet Transfer: Minimal assistance;Ambulation;Comfort height toilet;RW;Grab bars             General ADL Comments: pt had 2 units PRBCs earlier. Ambulated to bathroom with min guard assist.  Used high commode as 3;1 had pinched her skin earlier.  Educated on AE, but she will have 24/7 assistance and is not interested in this.  She has DME at home. Will plan to return for one more visit to practice shower transfer.      Vision     Perception     Praxis      Pertinent Vitals/Pain Pain Assessment: 0-10 Pain Score: 2  Pain  Location: R hip and thigh Pain Descriptors / Indicators: Aching Pain Intervention(s): Limited activity within patient's tolerance;Monitored during session;Repositioned     Hand Dominance     Extremity/Trunk Assessment Upper Extremity Assessment Upper Extremity Assessment: Overall WFL for tasks assessed           Communication Communication Communication: No difficulties   Cognition Arousal/Alertness: Awake/alert Behavior During Therapy: WFL for tasks assessed/performed Overall Cognitive Status: Within Functional Limits for tasks assessed                     General Comments       Exercises       Shoulder Instructions      Home Living Family/patient expects to be discharged to:: Private residence Living Arrangements: Spouse/significant other Available Help at Discharge: Family               Bathroom Shower/Tub: Walk-in Soil scientist Toilet: Handicapped height     Home Equipment: Bedside commode          Prior Functioning/Environment Level of Independence: Independent with assistive device(s)  OT Diagnosis: Acute pain   OT Problem List: Decreased strength;Decreased activity tolerance;Decreased knowledge of use of DME or AE;Pain   OT Treatment/Interventions: Self-care/ADL training;DME and/or AE instruction;Patient/family education    OT Goals(Current goals can be found in the care plan section) Acute Rehab OT Goals Patient Stated Goal: Walk without pain OT Goal Formulation: With patient Time For Goal Achievement: 07/27/15 Potential to Achieve Goals: Good ADL Goals Pt Will Perform Grooming: with supervision;standing Pt Will Transfer to Toilet: with supervision;ambulating;bedside commode Pt Will Perform Toileting - Clothing Manipulation and hygiene: with supervision;sit to/from stand Pt Will Perform Tub/Shower Transfer: Shower transfer;with min guard assist;ambulating;3 in 1  OT Frequency: Min 2X/week   Barriers to D/C:             Co-evaluation              End of Session    Activity Tolerance: Patient tolerated treatment well Patient left: in bed;with call bell/phone within reach;with family/visitor present   Time: 1453-1520 OT Time Calculation (min): 27 min Charges:  OT General Charges $OT Visit: 1 Procedure OT Evaluation $OT Eval Low Complexity: 1 Procedure OT Treatments $Self Care/Home Management : 8-22 mins G-Codes:    Haley Larson 07/20/2015, 4:23 PM Haley Larson, OTR/L [phone] 07/20/2015

## 2015-07-21 DIAGNOSIS — K219 Gastro-esophageal reflux disease without esophagitis: Secondary | ICD-10-CM | POA: Diagnosis present

## 2015-07-21 DIAGNOSIS — E039 Hypothyroidism, unspecified: Secondary | ICD-10-CM | POA: Diagnosis present

## 2015-07-21 DIAGNOSIS — D649 Anemia, unspecified: Secondary | ICD-10-CM | POA: Diagnosis present

## 2015-07-21 DIAGNOSIS — Z9884 Bariatric surgery status: Secondary | ICD-10-CM

## 2015-07-21 DIAGNOSIS — N179 Acute kidney failure, unspecified: Secondary | ICD-10-CM | POA: Diagnosis present

## 2015-07-21 DIAGNOSIS — R011 Cardiac murmur, unspecified: Secondary | ICD-10-CM | POA: Diagnosis present

## 2015-07-21 DIAGNOSIS — D72829 Elevated white blood cell count, unspecified: Secondary | ICD-10-CM | POA: Diagnosis present

## 2015-07-21 LAB — BASIC METABOLIC PANEL
Anion gap: 9 (ref 5–15)
BUN: 39 mg/dL — AB (ref 6–20)
CHLORIDE: 105 mmol/L (ref 101–111)
CO2: 21 mmol/L — AB (ref 22–32)
CREATININE: 2.07 mg/dL — AB (ref 0.44–1.00)
Calcium: 8 mg/dL — ABNORMAL LOW (ref 8.9–10.3)
GFR calc Af Amer: 28 mL/min — ABNORMAL LOW (ref >60)
GFR calc non Af Amer: 24 mL/min — ABNORMAL LOW (ref >60)
Glucose, Bld: 135 mg/dL — ABNORMAL HIGH (ref 65–99)
Potassium: 5.1 mmol/L (ref 3.5–5.1)
Sodium: 135 mmol/L (ref 135–145)

## 2015-07-21 LAB — CBC WITH DIFFERENTIAL/PLATELET
BASOS ABS: 0 10*3/uL (ref 0.0–0.1)
Basophils Relative: 0 %
Eosinophils Absolute: 0 10*3/uL (ref 0.0–0.7)
Eosinophils Relative: 0 %
HCT: 27.5 % — ABNORMAL LOW (ref 36.0–46.0)
Hemoglobin: 9.1 g/dL — ABNORMAL LOW (ref 12.0–15.0)
LYMPHS ABS: 2.3 10*3/uL (ref 0.7–4.0)
Lymphocytes Relative: 16 %
MCH: 28.5 pg (ref 26.0–34.0)
MCHC: 33.1 g/dL (ref 30.0–36.0)
MCV: 86.2 fL (ref 78.0–100.0)
MONO ABS: 2.4 10*3/uL — AB (ref 0.1–1.0)
Monocytes Relative: 17 %
Neutro Abs: 9.4 10*3/uL — ABNORMAL HIGH (ref 1.7–7.7)
Neutrophils Relative %: 67 %
PLATELETS: 169 10*3/uL (ref 150–400)
RBC: 3.19 MIL/uL — AB (ref 3.87–5.11)
RDW: 14.9 % (ref 11.5–15.5)
WBC: 14.1 10*3/uL — AB (ref 4.0–10.5)

## 2015-07-21 LAB — RETICULOCYTES
RBC.: 2.48 MIL/uL — ABNORMAL LOW (ref 3.87–5.11)
RETIC COUNT ABSOLUTE: 86.8 10*3/uL (ref 19.0–186.0)
Retic Ct Pct: 3.5 % — ABNORMAL HIGH (ref 0.4–3.1)

## 2015-07-21 LAB — POCT I-STAT 4, (NA,K, GLUC, HGB,HCT)
GLUCOSE: 173 mg/dL — AB (ref 65–99)
HEMATOCRIT: 28 % — AB (ref 36.0–46.0)
Hemoglobin: 9.5 g/dL — ABNORMAL LOW (ref 12.0–15.0)
Potassium: 4.2 mmol/L (ref 3.5–5.1)
SODIUM: 142 mmol/L (ref 135–145)

## 2015-07-21 LAB — VITAMIN B12: Vitamin B-12: 200 pg/mL (ref 180–914)

## 2015-07-21 LAB — IRON AND TIBC
IRON: 22 ug/dL — AB (ref 28–170)
SATURATION RATIOS: 11 % (ref 10.4–31.8)
TIBC: 197 ug/dL — ABNORMAL LOW (ref 250–450)
UIBC: 175 ug/dL

## 2015-07-21 LAB — CBC
HCT: 23.6 % — ABNORMAL LOW (ref 36.0–46.0)
Hemoglobin: 7.8 g/dL — ABNORMAL LOW (ref 12.0–15.0)
MCH: 29.1 pg (ref 26.0–34.0)
MCHC: 33.1 g/dL (ref 30.0–36.0)
MCV: 88.1 fL (ref 78.0–100.0)
Platelets: 164 10*3/uL (ref 150–400)
RBC: 2.68 MIL/uL — ABNORMAL LOW (ref 3.87–5.11)
RDW: 13.8 % (ref 11.5–15.5)
WBC: 14.3 10*3/uL — ABNORMAL HIGH (ref 4.0–10.5)

## 2015-07-21 LAB — URINALYSIS, ROUTINE W REFLEX MICROSCOPIC
Bilirubin Urine: NEGATIVE
GLUCOSE, UA: NEGATIVE mg/dL
Hgb urine dipstick: NEGATIVE
Ketones, ur: NEGATIVE mg/dL
LEUKOCYTES UA: NEGATIVE
NITRITE: NEGATIVE
PH: 6 (ref 5.0–8.0)
Protein, ur: NEGATIVE mg/dL
Specific Gravity, Urine: 1.015 (ref 1.005–1.030)

## 2015-07-21 LAB — FOLATE: FOLATE: 8.2 ng/mL (ref >5.9)

## 2015-07-21 LAB — FERRITIN: FERRITIN: 90 ng/mL (ref 11–307)

## 2015-07-21 LAB — PREPARE RBC (CROSSMATCH)

## 2015-07-21 LAB — TSH: TSH: 0.284 u[IU]/mL — AB (ref 0.350–4.500)

## 2015-07-21 MED ORDER — POLYETHYLENE GLYCOL 3350 17 G PO PACK
17.0000 g | PACK | Freq: Two times a day (BID) | ORAL | Status: DC
  Administered 2015-07-21 – 2015-07-22 (×2): 17 g via ORAL
  Filled 2015-07-21 (×3): qty 1

## 2015-07-21 MED ORDER — SODIUM CHLORIDE 0.9 % IV SOLN
Freq: Once | INTRAVENOUS | Status: AC
  Administered 2015-07-21: 10:00:00 via INTRAVENOUS

## 2015-07-21 MED ORDER — MAGNESIUM HYDROXIDE 400 MG/5ML PO SUSP
15.0000 mL | Freq: Once | ORAL | Status: AC
  Administered 2015-07-21: 15 mL via ORAL
  Filled 2015-07-21: qty 30

## 2015-07-21 MED ORDER — SENNA 8.6 MG PO TABS
1.0000 | ORAL_TABLET | Freq: Every day | ORAL | Status: DC
  Administered 2015-07-21: 8.6 mg via ORAL
  Filled 2015-07-21: qty 1

## 2015-07-21 MED ORDER — FERROUS SULFATE 325 (65 FE) MG PO TABS: 325.0000 mg | ORAL_TABLET | Freq: Two times a day (BID) | ORAL | Status: DC

## 2015-07-21 NOTE — Progress Notes (Signed)
Occupational Therapy Treatment Patient Details Name: Haley Larson MRN: [id number] DOB: 03/01/1951 Today's Date: 07/21/2015    History of present illness R THR; Hx of gastric bypass, RCR and fibromyalgia   OT comments  Session limited due to pt orthostatic; BP supine 118/51 and sitting EOB 88/50. Pt assisted onto bedpan to attempt to void but pt unable. Discussed at length shower transfer and safety with showering. Discussed placement of 3in1 in shower and use of walker. Will follow pt and practice shower transfer and ADL as appropriate.   Follow Up Recommendations  Supervision/Assistance - 24 hour;No OT follow up    Equipment Recommendations  None recommended by OT    Recommendations for Other Services      Precautions / Restrictions Precautions Precautions: Fall Precaution Comments: monitor dizziness and BP Restrictions RLE Weight Bearing: Weight bearing as tolerated       Mobility Bed Mobility Overal bed mobility: Needs Assistance Bed Mobility: Supine to Sit     Supine to sit: HOB elevated;Supervision Sit to supine: Min assist   General bed mobility comments: assist for LEs onto bed back to supine due to pt feeling dizzy.  Transfers                 General transfer comment: deferred due to drop in BP    Balance                                   ADL                                         General ADL Comments: BP taken in supine and 118/51. Sat EOB and BP 88/50 and pt reported feeling woozy. Returned to supine. Informed nursing of BPs. Assisted pt onto bedpan and pt feeling the need to void but unable on this visit. Discussed shower transfer and demonstrated technique but not able to practice this session due to drop in BP. Sister present and will look at shower at home and see if there is enough room for 3in1 and walker to be in the shower at the same time. Discussed if space is tight to place 3in1 facing out of shower and  use walker to step into shower and immediately sit down.       Vision                     Perception     Praxis      Cognition   Behavior During Therapy: WFL for tasks assessed/performed Overall Cognitive Status: Within Functional Limits for tasks assessed                       Extremity/Trunk Assessment               Exercises     Shoulder Instructions       General Comments      Pertinent Vitals/ Pain       Pain Assessment:  (pt stated sore but didnt rate.)  Home Living                                          Prior Functioning/Environment  Frequency Min 2X/week     Progress Toward Goals  OT Goals(current goals can now be found in the care plan section)  Progress towards OT goals: Not progressing toward goals - comment (due to drop in BP this session)     Plan Discharge plan remains appropriate    Co-evaluation                 End of Session     Activity Tolerance Other (comment) (orthostatic. returned to supine.)   Patient Left in bed;with call bell/phone within reach;with family/visitor present   Nurse Communication          Time: 1610-9604 OT Time Calculation (min): 40 min  Charges: OT General Charges $OT Visit: 1 Procedure OT Treatments $Self Care/Home Management : 8-22 mins $Therapeutic Activity: 23-37 mins  Haley Larson 07/21/2015, 9:44 AM

## 2015-07-21 NOTE — Progress Notes (Signed)
Physical Therapy Treatment Patient Details Name: Haley Larson MRN: [id number] DOB: [birth year] Today's Date: 07/21/2015    History of Present Illness R THR; Hx of gastric bypass, RCR and fibromyalgia    PT Comments    Pt presents with soft BPs and + for dizziness.  Pt with low HGB to receive blood this pm.  Pt required cues for advancing gait distance and mobility.  Plan to try stairs this pm if dizziness subsides.  Pt also will require review of standing HEP.    Follow Up Recommendations  Home health PT     Equipment Recommendations  None recommended by PT    Recommendations for Other Services OT consult     Precautions / Restrictions Precautions Precautions: Fall Precaution Comments: monitor dizziness and BP Restrictions Weight Bearing Restrictions: Yes RLE Weight Bearing: Weight bearing as tolerated    Mobility  Bed Mobility Overal bed mobility: Needs Assistance Bed Mobility: Supine to Sit     Supine to sit: HOB elevated;Supervision Sit to supine: Min assist   General bed mobility comments: Pt required cues for technique but no assistance needed.    Transfers Overall transfer level: Needs assistance Equipment used: Rolling walker (2 wheeled) Transfers: Sit to/from Stand Sit to Stand: Min guard         General transfer comment: Cues for hand placement to and from seated surface.    Ambulation/Gait Ambulation/Gait assistance: Min guard Ambulation Distance (Feet): 100 Feet Assistive device: Rolling walker (2 wheeled)   Gait velocity: decr   General Gait Details: cues for sequence, posture and position from RW.  Pt able to advance mobility OOB with PTA after +dizziness with OT this am.  Unable to ambulate distance yesterday due to mild dizziness at end of gait trial.     Stairs            Wheelchair Mobility    Modified Rankin (Stroke Patients Only)       Balance Overall balance assessment: Needs assistance   Sitting balance-Leahy  Scale: Good       Standing balance-Leahy Scale: Poor                      Cognition Arousal/Alertness: Awake/alert Behavior During Therapy: WFL for tasks assessed/performed Overall Cognitive Status: Within Functional Limits for tasks assessed                      Exercises Total Joint Exercises Ankle Circles/Pumps: AROM;Both;15 reps;Supine Quad Sets: AROM;Both;10 reps;Supine Gluteal Sets: AROM;Both;10 reps;Supine Heel Slides: AROM;Right;10 reps;Supine;AAROM Hip ABduction/ADduction: AROM;AAROM;Right;10 reps;Supine Straight Leg Raises: AROM;AAROM;Right;10 reps;Supine    General Comments        Pertinent Vitals/Pain Pain Assessment: 0-10 Pain Score: 4  Pain Location: R hip and thigh Pain Descriptors / Indicators: Grimacing;Guarding Pain Intervention(s): Monitored during session;Repositioned    Home Living                      Prior Function            PT Goals (current goals can now be found in the care plan section) Acute Rehab PT Goals Patient Stated Goal: Walk without pain Potential to Achieve Goals: Good Progress towards PT goals: Progressing toward goals    Frequency  7X/week    PT Plan      Co-evaluation             End of Session Equipment Utilized During Treatment: Gait belt Activity Tolerance: Patient  tolerated treatment well Patient left: in chair;with nursing/sitter in room;with family/visitor present     Time: [id number] PT Time Calculation (min) (ACUTE ONLY): 17 min  Charges:  $Gait Training: 8-22 mins                    G Codes:      Florestine Aversimee J Laurren Lepkowski 07/21/2015, 11:18 AM  Joycelyn RuaAimee Taylin Mans, PTA pager [phone]

## 2015-07-21 NOTE — Progress Notes (Signed)
Subjective: 2 Days Post-Op Procedure(s) (LRB): TOTAL HIP ARTHROPLASTY ANTERIOR APPROACH (Right) Patient reports pain as mild.  Patient doing great with pain control and with PT.  Still remains a little lightheaded.  No chest pain/sob, nausea or vomiting.    Objective: Vital signs in last 24 hours: Temp:  [97.4 F (36.3 C)-98.4 F (36.9 C)] 98.2 F (36.8 C) (04/28 0409) Pulse Rate:  [80-117] 87 (04/28 0409) Resp:  [16-20] 16 (04/28 0409) BP: (91-135)/(43-98) 117/50 mmHg (04/28 0409) SpO2:  [92 %-95 %] 92 % (04/28 0409)  Intake/Output from previous day: 04/27 0701 - 04/28 0700 In: 1216.3 [I.V.:610; Blood:606.3] Out: -  Intake/Output this shift:     Recent Labs  07/19/15 1100 07/20/15 0617 07/20/15 1502 07/21/15 0533  HGB 9.5* 7.5* 9.7* 7.8*    Recent Labs  07/20/15 1502 07/21/15 0533  WBC 13.1* 14.3*  RBC 3.25* 2.68*  HCT 29.1* 23.6*  PLT 165 164    Recent Labs  07/20/15 0617 07/21/15 0533  NA 137 135  K 5.9* 5.1  CL 105 105  CO2 21* 21*  BUN 29* 39*  CREATININE 1.79* 2.07*  GLUCOSE 155* 135*  CALCIUM 8.0* 8.0*   No results for input(s): LABPT, INR in the last 72 hours.  Neurologically intact Neurovascular intact Sensation intact distally Intact pulses distally Dorsiflexion/Plantar flexion intact  Negative calf ttp bilaterally  Assessment/Plan: 2 Days Post-Op Procedure(s) (LRB): TOTAL HIP ARTHROPLASTY ANTERIOR APPROACH (Right) Advance diet Up with therapy  ABLA-transfused with 2 units prbc yesterday. Hb trending upwards, but still anemic.   WBAT RLE-anterior hip precautions Acute renal failure-initially suspected this was from dehydration, but after having fluids for an additional day and rechecking labs this am, I am not as convinced.  I have paged medicine to do a formal consult. Dry dressing change prn Will d/c home with hhpt once cleared by medicine  Otilio SaberM Lindsey Stanbery 07/21/2015, 7:55 AM

## 2015-07-21 NOTE — Progress Notes (Addendum)
Iron slightly low at 22 which is more indicative of a slow loss process- could be 2/2 to PPI but need to ensure no GI losses 2/2 bleeding. Start oral iron.  TSH low at 0.284 which could be reflective of sick euthyroid syndrom. Ck Free T4 and T3 in am  Junious SilkAllison Ellis, ANP

## 2015-07-21 NOTE — Consult Note (Signed)
Consultation Note   Haley Larson ZOX:[id number] DOB: 09/20/1950 DOA: 07/19/2015  Requesting MD/NP/PA: Eulah Pont / Orthopedics PCP: WHITE, Bonnell Public, NP  Outpatient Specialists: Loreta Ave / GI; [place] / Cardiology Patient coming from: Private residence lives with husband  Reason for consultation: Acute renal failure and persistent anemia  HPI: Haley Larson is a 65 y.o. female with medical history significant for nonspecified dyspnea (resolved) evaluated by cardiology as an outpatient, morbid obesity, hypothyroidism, history of gastric bypass surgery, and dyslipidemia. Patient has significant arthritis with functional disabilities in her right hip and had failed conservative treatments and therefore was admitted by the orthopedic service on 4/26 to undergo right hip replacement with prosthesis. Patient had a documented EBL of 400 mL with subsequent postoperative anemia hemoglobin down to 7.5. She also had some mild renal insufficiency with BUN 29 and creatinine 1.79 on postop day 1. She was given 2 units of packed red blood cells and started on IV fluids at 100 mL/h. On 4/27 hemoglobin had increased to 9.7. Unfortunately by 4/28 her hemoglobin had once again decreased to 7.8 and her renal function had worsened with BUN of 39 and creatinine of 2.7. Platelets are normal. Potassium slightly elevated at 5.1. Orthopedic service has requested medical input regarding worsening renal function and her anemia. Of note patient's renal function was normal 2 weeks prior with a BUN of 13 and a creatinine of 0.78 and her hemoglobin was normal at 13.1.   Review of Systems:  In addition to the HPI above,  No Fever-chills, myalgias or other constitutional symptoms No Headache, changes with Vision or hearing, new weakness, tingling, numbness in any extremity, No problems swallowing food or Liquids, indigestion/reflux No Chest pain, Cough or Shortness of Breath, palpitations, orthopnea or DOE No Abdominal pain, N/V; no  melena or hematochezia, no dark tarry stools, no bowel movement since admission, patient reports excessive belching since procedure; patient takes probiotics to aid in bowel motility No dysuria, hematuria or flank pain although patient reported preoperatively her urine appeared concentrated and low in volume output No new skin rashes, lesions, masses or bruises, No new joints pains-aches No recent weight gain or loss No polyuria, polydypsia or polyphagia,   Past Medical History  Diagnosis Date  . Chest pain   . H/O: hysterectomy   . Dyslipidemia   . Postmenopausal   . Morbid obesity (HCC)   . Family history of early CAD     STRONG  . PONV (postoperative nausea and vomiting)   . Seasonal allergies   . Hypothyroidism   . Fibromyalgia   . GERD (gastroesophageal reflux disease)   . Arthritis     Past Surgical History  Procedure Laterality Date  . Rotator cuff repair  04/2007  . Rotator cuff repair  08/2007  . Gastric bypass  1977  . Abdominal hysterectomy    . Tonsillectomy    . Colonoscopy w/ polypectomy    . 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 Total hip arthroplasty Right 07/19/2015    Procedure: TOTAL HIP ARTHROPLASTY ANTERIOR APPROACH;  Surgeon: Loreta Ave, MD;  Location: [facility] OR;  Service: Orthopedics;  Laterality: Right;     reports that she quit smoking about 9 years ago. Her smoking use included Cigarettes. She has a 30 pack-year smoking history. She has never used smokeless tobacco. She reports that she does not drink alcohol or use illicit drugs.  No Known Allergies  Family History  Problem Relation Age of Onset  . Other Father  infection   . Coronary artery disease Mother     with stents  . Atrial fibrillation Mother     with pacemaker  . Heart failure Mother   . Diabetes Mother   . Heart attack Brother 38    massive  MI  . Heart attack Brother 54    x3  . Heart attack Brother 72    with stent  . Coronary artery disease Brother     with  stent  . Alcohol abuse Brother   . Coronary artery disease Brother     with stents  . Heart attack Brother     Prior to Admission medications   Medication Sig Start Date End Date Taking? Authorizing Provider  aspirin 81 MG tablet Take 81 mg by mouth daily.   Yes Historical Provider, MD  Coconut Oil 1000 MG CAPS Take 1-2 capsules by mouth 2 (two) times a week.   Yes Historical Provider, MD  DULoxetine (CYMBALTA) 60 MG capsule Take 60 mg by mouth daily.   Yes Historical Provider, MD  estradiol (ESTRACE) 0.5 MG tablet Take 1 tablet by mouth daily. 10/27/13  Yes Historical Provider, MD  fluticasone (FLONASE) 50 MCG/ACT nasal spray Place 1 spray into the nose daily as needed for allergies.  09/02/12  Yes Historical Provider, MD  HYDROcodone-acetaminophen (NORCO/VICODIN) 5-325 MG tablet Take 1 tablet by mouth every 4 (four) hours as needed for moderate pain.   Yes Historical Provider, MD  ibuprofen (ADVIL,MOTRIN) 200 MG tablet Take 600 mg by mouth every 8 (eight) hours as needed for mild pain or moderate pain.   Yes Historical Provider, MD  levothyroxine (SYNTHROID, LEVOTHROID) 88 MCG tablet Take 88 mcg by mouth daily before breakfast.  09/16/12  Yes Historical Provider, MD  montelukast (SINGULAIR) 10 MG tablet Take 1 tablet by mouth at bedtime. 10/05/13  Yes Historical Provider, MD  Multiple Vitamin (MULTIVITAMIN) tablet Take 1 tablet by mouth daily.   Yes Historical Provider, MD  naproxen sodium (ALEVE) 220 MG tablet Take 440 mg by mouth 2 (two) times daily as needed.   Yes Historical Provider, MD  Omega 3 1000 MG CAPS Take 2-3 capsules by mouth daily.   Yes Historical Provider, MD  omeprazole (PRILOSEC) 40 MG capsule Take 1 capsule by mouth daily as needed. 10/19/13  Yes Historical Provider, MD  rosuvastatin (CRESTOR) 10 MG tablet Take 10 mg by mouth daily.   Yes Historical Provider, MD  apixaban (ELIQUIS) 2.5 MG TABS tablet Take 1 tab po q12 hours x 35 days following surgery to prevent blood clots  07/19/15   Cristie Hem, PA-C  bisacodyl (DULCOLAX) 5 MG EC tablet Take 1 tablet (5 mg total) by mouth daily as needed for moderate constipation. 07/19/15   Cristie Hem, PA-C  HYDROcodone-acetaminophen (NORCO) 5-325 MG tablet Take 1-2 tablets by mouth every 4 (four) hours as needed for moderate pain. 07/20/15   Cristie Hem, PA-C  methocarbamol (ROBAXIN) 500 MG tablet Take 1 tablet (500 mg total) by mouth 4 (four) times daily. 07/19/15   Cristie Hem, PA-C  ondansetron (ZOFRAN) 4 MG tablet Take 1 tablet (4 mg total) by mouth every 8 (eight) hours as needed for nausea or vomiting. 07/19/15   Cristie Hem, PA-C    Physical Exam: Filed Vitals:   07/21/15 0014 07/21/15 0409 07/21/15 0900 07/21/15 0905  BP: 120/57 117/50 118/51 88/50  Pulse: 85 87    Temp: 98.4 F (36.9 C) 98.2 F (36.8 C)  TempSrc: Oral Oral    Resp: 16 16    Height:      Weight:      SpO2: 93% 92%        Constitutional: NAD, calm, comfortable-Overall pale in appearance Filed Vitals:   07/21/15 0014 07/21/15 0409 07/21/15 0900 07/21/15 0905  BP: 120/57 117/50 118/51 88/50  Pulse: 85 87    Temp: 98.4 F (36.9 C) 98.2 F (36.8 C)    TempSrc: Oral Oral    Resp: 16 16    Height:      Weight:      SpO2: 93% 92%     Eyes: PERRL, lids and conjunctivae Somewhat pale in appearance ENMT: Mucous membranes are moist. Posterior pharynx clear of any exudate or lesions.Normal dentition.  Neck: normal, supple, no masses, no thyromegaly Respiratory: clear to auscultation bilaterally, no wheezing, no crackles. Normal respiratory effort. No accessory muscle use.  Cardiovascular: Regular rate and rhythm, grade 1-2/6 systolic murmur left sternal border second intercostal space. no rubs / gallops. Focal right lower extremity edema (see below). 2+ pedal pulses. No carotid bruits. Orthostatic vital signs were positive with a drop in systolic blood pressure from 118-88 when changing from supine to seated Abdomen: no  tenderness, no masses palpated. No hepatosplenomegaly. Bowel sounds positive.  Musculoskeletal: no clubbing / cyanosis. No joint deformity upper and lower extremities. Good ROM, no contractures. Normal muscle tone. Right leg (operative side) more edematous than left with some mild bruising but no obvious hematoma otherwise dressing to right hip clean dry and intact Skin: no rashes, lesions, ulcers. No induration Neurologic: CN 2-12 grossly intact. Sensation intact, DTR normal. Strength 5/5 in all 4.  Psychiatric: Normal judgment and insight. Alert and oriented x 3. Normal mood.    Labs on Admission: I have personally reviewed following labs and imaging studies  CBC:  Recent Labs Lab 07/19/15 1100 07/20/15 0617 07/20/15 1502 07/21/15 0533  WBC  --  13.1* 13.1* 14.3*  HGB 9.5* 7.5* 9.7* 7.8*  HCT 28.0* 22.6* 29.1* 23.6*  MCV  --  92.6 89.5 88.1  PLT  --  222 165 164   Basic Metabolic Panel:  Recent Labs Lab 07/19/15 1100 07/20/15 0617 07/21/15 0533  NA 142 137 135  K 4.2 5.9* 5.1  CL  --  105 105  CO2  --  21* 21*  GLUCOSE 173* 155* 135*  BUN  --  29* 39*  CREATININE  --  1.79* 2.07*  CALCIUM  --  8.0* 8.0*   GFR: Estimated Creatinine Clearance: 33.5 mL/min (by C-G formula based on Cr of 2.07). Liver Function Tests: No results for input(s): AST, ALT, ALKPHOS, BILITOT, PROT, ALBUMIN in the last 168 hours. No results for input(s): LIPASE, AMYLASE in the last 168 hours. No results for input(s): AMMONIA in the last 168 hours. Coagulation Profile: No results for input(s): INR, PROTIME in the last 168 hours. Cardiac Enzymes: No results for input(s): CKTOTAL, CKMB, CKMBINDEX, TROPONINI in the last 168 hours. BNP (last 3 results) No results for input(s): PROBNP in the last 8760 hours. HbA1C: No results for input(s): HGBA1C in the last 72 hours. CBG: No results for input(s): GLUCAP in the last 168 hours. Lipid Profile: No results for input(s): CHOL, HDL, LDLCALC, TRIG,  CHOLHDL, LDLDIRECT in the last 72 hours. Thyroid Function Tests: No results for input(s): TSH, T4TOTAL, FREET4, T3FREE, THYROIDAB in the last 72 hours. Anemia Panel: No results for input(s): VITAMINB12, FOLATE, FERRITIN, TIBC, IRON, RETICCTPCT in the last 72  hours. Urine analysis:    Component Value Date/Time   COLORURINE YELLOW 01/30/2010 0454   APPEARANCEUR CLEAR 01/30/2010 0454   LABSPEC 1.025 01/30/2010 0454   PHURINE 6.5 01/30/2010 0454   GLUCOSEU NEGATIVE 01/30/2010 0454   HGBUR NEGATIVE 01/30/2010 0454   BILIRUBINUR NEGATIVE 01/30/2010 0454   KETONESUR NEGATIVE 01/30/2010 0454   PROTEINUR NEGATIVE 01/30/2010 0454   UROBILINOGEN 0.2 01/30/2010 0454   NITRITE NEGATIVE 01/30/2010 0454   LEUKOCYTESUR  01/30/2010 0454    NEGATIVE MICROSCOPIC NOT DONE ON URINES WITH NEGATIVE PROTEIN, BLOOD, LEUKOCYTES, NITRITE, OR GLUCOSE <1000 mg/dL.   Sepsis Labs: (procalcitonin:4,lacticidven:4) )No results found for this or any previous visit (from the past 240 hour(s)).   Radiological Exams on Admission: Dg Hip Operative Unilat With Pelvis Right  07/19/2015  CLINICAL DATA:  Postop right total hip arthroplasty. EXAM: OPERATIVE RIGHT HIP (WITH PELVIS IF PERFORMED) 2 VIEWS TECHNIQUE: Fluoroscopic spot image(s) were submitted for interpretation post-operatively. COMPARISON:  Radiographs 07/08/2014 FINDINGS: Spot AP fluoroscopic images of the right hip and symphysis pubis demonstrate interval right total hip arthroplasty with a screw fixed acetabular component. The hardware appears well positioned. No evidence of acute fracture or dislocation on single plane imaging. IMPRESSION: No demonstrated complication following right total hip arthroplasty. Electronically Signed   By: Carey Bullocks M.D.   On: 07/19/2015 10:31    Assessment/Plan Principal Problem:   Acute kidney injury  -Baseline creatinine 0.78 with BUN 13 on 4/14 with current BUN 39 and creatinine 2.7 -Suspect hypoperfusion from  symptomatic anemia i.e. Hypotension -Was on NSAIDs prior to admission but these were not continued postoperatively and renal function was normal preoperatively -She was given a dose of Lasix 20 mg on 4/271 dose -Continue to follow labs -Monitor intake and output-patient reports decreased urinary output so we'll obtain bladder scan and if experiencing urinary retention may require renal ultrasound to better clarify (per intake and output patient is nearly 3 L positive since admission)  Active Problems:   Symptomatic anemia -Suspect primarily related to surgical blood loss -Patient with history of excessive NSAIDs use with history of epigastric discomfort in the past and known history of gastric bypass (i.e. prior vagotomy) so concern potentially could have GI component -Follow operative site/leg closely since patient is on eliquis postoperatively for DVT prophylaxis -Transfuse 2 units packed red blood cells -Check anemia panel (although this will be done post transfusion of packed red blood cells on 4/26) -Check H. pylori serologies -Check TSH -Continue PPI -Check fecal occult blood    Leukocytosis -Likely an expected finding postop especially with low perfusion from symptomatic anemia -In setting of decreased urinary output could be related to UTI so we'll check urinalysis and culture    GERD  -Continue PPI as above    History of gastric bypass -Discussed with patient need to minimize NSAID use in the future given people with gastric bypass had undergone vagotomy procedure and this increases risk for ulcer formation    Systolic murmur -Appears to be a new finding and may be related to acute anemia -Last echocardiogram in 2014 without valvular disorder and last cardiology note August 2016 documented no murmurs auscultated at that time -Check echocardiogram    Obesity, morbid, BMI 40.0-49.9  -Primary care physician (ie NP) is managing weight reduction strategies     Hyperlipidemia -Continue Crestor    Status post THR (total hip replacement) -Per primary orthopedic team -Because of symptomatic anemia with orthostatic hypotension PT and OT have been canceled for today 4/28  Hypothyroidism -Continue Synthroid -TSH as above      DVT prophylaxis: Eliquis Code Status: Full code  Family Communication: Sister at bedside Pierce Crane(Lynn Tewett) Disposition Plan: Anticipate discharge back to previous home environment but at discretion of orthopedic service Mobility: With a cane prior to admission and currently requiring rolling walker   ELLIS,ALLISON L. ANP-BC [facility] Pager [phone]   If 7PM-7AM, please contact night-coverage [email or web address] Password [email or web address]  07/21/2015, 9:50 AM

## 2015-07-21 NOTE — Progress Notes (Signed)
   07/21/15 0911  PT Visit Information  Last PT Received On 07/21/15  Assistance Needed +1  Reason Eval/Treat Not Completed Other (comment) (Pt with OT at this time.  OT reports patient with soft BPs and + for dizziness will return later this am to check for therapy.  )  History of Present Illness R THR; Hx of gastric bypass, RCR and fibromyalgia  Haley Larson, PTA pager [phone]

## 2015-07-21 NOTE — Progress Notes (Signed)
   07/21/15 1605  PT Visit Information  Last PT Received On 07/21/15  Assistance Needed +2 (for chair follow, soft BPs and low HGB.  )  Reason Eval/Treat Not Completed Other (comment);Medical issues which prohibited therapy (x1 receving unit of blood and nauseous, x1 eating, x1 nurse into cath patient.  )  History of Present Illness R THR; Hx of gastric bypass, RCR and fibromyalgia  Joycelyn Ruaimee Haley Larson, PTA pager [phone]  Pt received 2 unit of blood yesterday, HGB dropped again and received 1 unit of blood and possibly a 4th.  Please plan to review stairs and standing HEP next visit before d/c home.

## 2015-07-22 DIAGNOSIS — Z9660 Presence of unspecified orthopedic joint implant: Secondary | ICD-10-CM

## 2015-07-22 DIAGNOSIS — Z9889 Other specified postprocedural states: Secondary | ICD-10-CM

## 2015-07-22 DIAGNOSIS — E039 Hypothyroidism, unspecified: Secondary | ICD-10-CM

## 2015-07-22 DIAGNOSIS — R011 Cardiac murmur, unspecified: Secondary | ICD-10-CM

## 2015-07-22 DIAGNOSIS — N179 Acute kidney failure, unspecified: Secondary | ICD-10-CM

## 2015-07-22 DIAGNOSIS — K219 Gastro-esophageal reflux disease without esophagitis: Secondary | ICD-10-CM

## 2015-07-22 DIAGNOSIS — D649 Anemia, unspecified: Secondary | ICD-10-CM

## 2015-07-22 LAB — URINE CULTURE: CULTURE: NO GROWTH

## 2015-07-22 LAB — TYPE AND SCREEN
ABO/RH(D): O POS
Antibody Screen: NEGATIVE
Unit division: 0
Unit division: 0
Unit division: 0
Unit division: 0

## 2015-07-22 LAB — CBC
HEMATOCRIT: 25.7 % — AB (ref 36.0–46.0)
HEMOGLOBIN: 8.7 g/dL — AB (ref 12.0–15.0)
MCH: 29.8 pg (ref 26.0–34.0)
MCHC: 33.9 g/dL (ref 30.0–36.0)
MCV: 88 fL (ref 78.0–100.0)
Platelets: 160 10*3/uL (ref 150–400)
RBC: 2.92 MIL/uL — AB (ref 3.87–5.11)
RDW: 15.7 % — ABNORMAL HIGH (ref 11.5–15.5)
WBC: 11.3 10*3/uL — ABNORMAL HIGH (ref 4.0–10.5)

## 2015-07-22 LAB — BASIC METABOLIC PANEL
Anion gap: 8 (ref 5–15)
BUN: 38 mg/dL — AB (ref 6–20)
CHLORIDE: 105 mmol/L (ref 101–111)
CO2: 22 mmol/L (ref 22–32)
CREATININE: 1.66 mg/dL — AB (ref 0.44–1.00)
Calcium: 8.3 mg/dL — ABNORMAL LOW (ref 8.9–10.3)
GFR calc Af Amer: 37 mL/min — ABNORMAL LOW (ref >60)
GFR calc non Af Amer: 32 mL/min — ABNORMAL LOW (ref >60)
Glucose, Bld: 122 mg/dL — ABNORMAL HIGH (ref 65–99)
POTASSIUM: 4.4 mmol/L (ref 3.5–5.1)
SODIUM: 135 mmol/L (ref 135–145)

## 2015-07-22 LAB — T4, FREE: Free T4: 1.15 ng/dL — ABNORMAL HIGH (ref 0.61–1.12)

## 2015-07-22 LAB — OCCULT BLOOD X 1 CARD TO LAB, STOOL: FECAL OCCULT BLD: NEGATIVE

## 2015-07-22 MED ORDER — VITAMIN B-12 1000 MCG PO TABS
1000.0000 ug | ORAL_TABLET | Freq: Every day | ORAL | Status: DC
  Administered 2015-07-22: 1000 ug via ORAL
  Filled 2015-07-22: qty 1

## 2015-07-22 MED ORDER — POLYETHYLENE GLYCOL 3350 17 G PO PACK: 17.0000 g | PACK | Freq: Two times a day (BID) | ORAL | Status: DC

## 2015-07-22 MED ORDER — SENNA 8.6 MG PO TABS: 1.0000 | ORAL_TABLET | Freq: Every day | ORAL | Status: DC

## 2015-07-22 MED ORDER — FERROUS SULFATE 325 (65 FE) MG PO TABS: 325.0000 mg | ORAL_TABLET | Freq: Two times a day (BID) | ORAL | Status: DC

## 2015-07-22 MED ORDER — DOCUSATE SODIUM 100 MG PO CAPS: 100.0000 mg | ORAL_CAPSULE | Freq: Two times a day (BID) | ORAL | Status: DC | PRN

## 2015-07-22 NOTE — Progress Notes (Signed)
Dr. Carola FrostHandy paged to request med for indigestion for patient.

## 2015-07-22 NOTE — Progress Notes (Signed)
Orthopaedic Trauma Service (OTS)  Subjective: 3 Days Post-Op Procedure(s) (LRB): TOTAL HIP ARTHROPLASTY ANTERIOR APPROACH (Right) Patient reports pain as mild.   Worked well with PT; no BM yet.  Objective: Current Vitals Blood pressure 123/48, pulse 81, temperature 98 F (36.7 C), temperature source Oral, resp. rate 16, height 5\' 4"  (1.626 m), weight 239 lb (108.41 kg), SpO2 95 %. Vital signs in last 24 hours: Temp:  [98 F (36.7 C)-98.6 F (37 C)] 98 F (36.7 C) (04/29 0457) Pulse Rate:  [81-93] 81 (04/29 0457) Resp:  [16-17] 16 (04/29 0457) BP: (110-123)/(48-61) 123/48 mmHg (04/29 0457) SpO2:  [93 %-98 %] 95 % (04/29 0457)  Intake/Output from previous day: 04/28 0701 - 04/29 0700 In: 875 [P.O.:75; Blood:800] Out: -   LABS  Recent Labs  07/20/15 0617 07/20/15 1502 07/21/15 0533 07/21/15 2005 07/22/15 0233  HGB 7.5* 9.7* 7.8* 9.1* 8.7*    Recent Labs  07/21/15 2005 07/22/15 0233  WBC 14.1* 11.3*  RBC 3.19* 2.92*  HCT 27.5* 25.7*  PLT 169 160    Recent Labs  07/21/15 0533 07/22/15 0233  NA 135 135  K 5.1 4.4  CL 105 105  CO2 21* 22  BUN 39* 38*  CREATININE 2.07* 1.66*  GLUCOSE 135* 122*  CALCIUM 8.0* 8.3*   No results for input(s): LABPT, INR in the last 72 hours.  Physical Exam  RLE Dressing intact, clean, dry, pristine  Edema/ swelling controlled  Sens: DPN, SPN, TN intact  Motor: EHL, FHL, and lessor toe ext and flex all intact grossly  Brisk cap refill, warm to touch  Imaging No results found.  Assessment/Plan: 3 Days Post-Op Procedure(s) (LRB): TOTAL HIP ARTHROPLASTY ANTERIOR APPROACH (Right)  1. Eliquis 2. PT/OT, hop prec 3. [facility]  Myrene GalasMichael Ixchel Duck, MD [facility] [phone] (p)  07/22/2015, 10:19 AM

## 2015-07-22 NOTE — Progress Notes (Signed)
[facility] Consultation Progress Note  Patient: Haley Larson ZOX:[id number]   PCP: April MansonWHITE, MARSHA L, NP DOB: 09/25/1950   DOA: 07/19/2015   DOS: 07/22/2015   Date of Service: the patient was seen and examined on 07/22/2015 Primary service: Loreta Aveaniel F Murphy, MD  Subjective: The patient complains of constipation. No nausea no vomiting. Pain is well-controlled. No active bleeding reported. Did have 2 bowel movements but needed an enema Nutrition: Tolerating oral diet Activity: Working with physical therapy Last BM: 07/22/2015  Assessment and Plan: 1. Acute kidney injury (HCC) Improvement in renal function. Patient is urinating adequately. Oral intake is also adequate. We will avoid nephrotoxic medications on discharge. Repeat CBC in one week on discharge.  2. Anemia. Postoperative blood loss anemia. Status post PRBC. H&H remains stable. Iron supplementation on discharge will be recommended.  3. Constipation. Resolved with soapsuds edema. Recommend to take stool softeners even after discharge.  4. Status post total hip replacement. Management per orthopedics.  5. Cardiac murmur. Very faint minimal murmur. No indication for inpatient echocardiogram at present. Will discontinue. Recommend patient to follow-up with cardiology. Patient is not symptomatic, no dyspnea on exertion or shortness of breath at rest no dizziness no lightheadedness no syncope or no chest pain on exertion or even at rest.  6. Hypothyroidism. CT for mildly elevated. Likely sick euthyroid syndrome. Recommend to recheck in 1 week.  7. vitamin B 12 deficiency. Oral replacement.  DVT Prophylaxis: on therapeutic anticoagulation. Nutrition: Regular diet Thank you very much for involving us in care of your patient.   Disposition: We will continue to follow the patient.  Barriers to safe discharge: Constipation  Recommendation on discharge: Recheck CBC and CMP in 1 week. Recheck thyroid function in  one week. Follow-up with PCP in one week, follow-up with cardiology as well.      Advance goals of care discussion: full code  Family Communication: family was present at bedside, at the time of interview. The pt provided permission to discuss medical plan with the family. Opportunity was given to ask question and all questions were answered satisfactorily.   Brief Summary of Hospitalization:  Daily update, Procedures: Status post hip arthroplasty right Antibiotics: Anti-infectives    Start     Dose/Rate Route Frequency Ordered Stop   07/19/15 1400  ceFAZolin (ANCEF) IVPB 2g/100 mL premix     2 g 200 mL/hr over 30 Minutes Intravenous Every 6 hours 07/19/15 1104 07/19/15 2211   07/19/15 0800  ceFAZolin (ANCEF) IVPB 2g/100 mL premix     2 g 200 mL/hr over 30 Minutes Intravenous To ShortStay [facility] 07/18/15 1023 07/19/15 0843        Intake/Output Summary (Last 24 hours) at 07/22/15 1703 Last data filed at 07/22/15 1300  Gross per 24 hour  Intake   1145 ml  Output      0 ml  Net   1145 ml   Filed Weights   07/19/15 0653  Weight: 108.41 kg (239 lb)    Objective: Physical Exam: Filed Vitals:   07/21/15 1846 07/21/15 1950 07/22/15 0457 07/22/15 1300  BP: 123/61 122/52 123/48 117/57  Pulse: 90 89 81 80  Temp: 98.6 F (37 C) 98.3 F (36.8 C) 98 F (36.7 C) 97.9 F (36.6 C)  TempSrc: Oral  Oral   Resp: 17 17 16 16   Height:      Weight:      SpO2: 94% 93% 95% 97%     General: Appear in mild distress, no Rash; Oral  Mucosa moist. Cardiovascular: S1 and S2 Present, aortic systolic Murmur, no JVD Respiratory: Bilateral Air entry present and Clear to Auscultation, no Crackles, no wheezes Abdomen: Bowel Sound present, Soft and no tenderness Extremities: no Pedal edema, no calf tenderness Neurology: Grossly no focal neuro deficit.  Data Reviewed: CBC:  Recent Labs Lab 07/20/15 0617 07/20/15 1502 07/21/15 0533 07/21/15 2005 07/22/15 0233  WBC 13.1* 13.1* 14.3*  14.1* 11.3*  NEUTROABS  --   --   --  9.4*  --   HGB 7.5* 9.7* 7.8* 9.1* 8.7*  HCT 22.6* 29.1* 23.6* 27.5* 25.7*  MCV 92.6 89.5 88.1 86.2 88.0  PLT 222 165 164 169 160   Basic Metabolic Panel:  Recent Labs Lab 07/19/15 1100 07/20/15 0617 07/21/15 0533 07/22/15 0233  NA 142 137 135 135  K 4.2 5.9* 5.1 4.4  CL  --  105 105 105  CO2  --  21* 21* 22  GLUCOSE 173* 155* 135* 122*  BUN  --  29* 39* 38*  CREATININE  --  1.79* 2.07* 1.66*  CALCIUM  --  8.0* 8.0* 8.3*   Liver Function Tests: No results for input(s): AST, ALT, ALKPHOS, BILITOT, PROT, ALBUMIN in the last 168 hours. No results for input(s): LIPASE, AMYLASE in the last 168 hours. No results for input(s): AMMONIA in the last 168 hours.  Cardiac Enzymes: No results for input(s): CKTOTAL, CKMB, CKMBINDEX, TROPONINI in the last 168 hours. BNP (last 3 results) No results for input(s): BNP in the last 8760 hours.  CBG: No results for input(s): GLUCAP in the last 168 hours.  Recent Results (from the past 240 hour(s))  Urine culture     Status: None   Collection Time: 07/21/15 10:57 AM  Result Value Ref Range Status   Specimen Description URINE, RANDOM  Final   Special Requests NONE  Final   Culture NO GROWTH 1 DAY  Final   Report Status 07/22/2015 FINAL  Final     Studies: No results found.   Scheduled Meds: . apixaban  2.5 mg Oral Q12H  . docusate sodium  100 mg Oral BID  . DULoxetine  60 mg Oral Daily  . levothyroxine  88 mcg Oral QAC breakfast  . montelukast  10 mg Oral QHS  . pantoprazole  80 mg Oral Daily  . polyethylene glycol  17 g Oral BID  . rosuvastatin  10 mg Oral Daily  . senna  1 tablet Oral QHS  . vitamin B-12  1,000 mcg Oral Daily   Continuous Infusions: . sodium chloride 10 mL/hr at 07/21/15 0924   PRN Meds: acetaminophen **OR** acetaminophen, bisacodyl, HYDROcodone-acetaminophen, HYDROmorphone (DILAUDID) injection, menthol-cetylpyridinium **OR** phenol, methocarbamol **OR** methocarbamol  (ROBAXIN)  IV, metoCLOPramide **OR** metoCLOPramide (REGLAN) injection, ondansetron **OR** ondansetron (ZOFRAN) IV, promethazine, zolpidem  Time spent: 30 minutes  Author: Lynden Oxford, MD [facility] Pager: [phone] 07/22/2015 5:03 PM  If 7PM-7AM, please contact night-coverage at [email or web address], password [facility]

## 2015-07-22 NOTE — Progress Notes (Signed)
Physical Therapy Treatment Patient Details Name: LAURYNN MCCORVEY MRN: [id number] DOB: 1950-09-21 Today's Date: 08-17-2015    History of Present Illness Rt anterior THA; Hx of gastric bypass, RCR and fibromyalgia    PT Comments    Pt with excellent progression without lightheadedness or fatigue and able to ambulate entire unit, perform stairs and HEp today. Pt safe for return home when medically cleared. Will continue to follow acutely to maximize independence.   Follow Up Recommendations  Home health PT     Equipment Recommendations       Recommendations for Other Services       Precautions / Restrictions Precautions Precautions: Fall Restrictions RLE Weight Bearing: Weight bearing as tolerated    Mobility  Bed Mobility Overal bed mobility: Modified Independent                Transfers Overall transfer level: Needs assistance   Transfers: Sit to/from Stand Sit to Stand: Supervision         General transfer comment: cues for hand placement  Ambulation/Gait Ambulation/Gait assistance: Supervision Ambulation Distance (Feet): 500 Feet Assistive device: Rolling walker (2 wheeled) Gait Pattern/deviations: Step-through pattern;Decreased stride length;Trunk flexed   Gait velocity interpretation: Below normal speed for age/gender General Gait Details: cues for posture and position in RW   Stairs Stairs: Yes Stairs assistance: Supervision Stair Management: Step to pattern;Sideways;One rail Right Number of Stairs: 4 General stair comments: cues for sequence with pt able to demonstrate  Wheelchair Mobility    Modified Rankin (Stroke Patients Only)       Balance                                    Cognition Arousal/Alertness: Awake/alert Behavior During Therapy: WFL for tasks assessed/performed Overall Cognitive Status: Within Functional Limits for tasks assessed                      Exercises Total Joint Exercises Hip  ABduction/ADduction: AROM;Right;15 reps;Standing Knee Flexion: AROM;Standing;Right;15 reps Marching in Standing: AROM;Standing;Right;15 reps Standing Hip Extension: AROM;Right;15 reps;Standing    General Comments        Pertinent Vitals/Pain Pain Assessment: No/denies pain    Home Living                      Prior Function            PT Goals (current goals can now be found in the care plan section) Progress towards PT goals: Goals met and updated - see care plan    Frequency       PT Plan Current plan remains appropriate    Co-evaluation             End of Session Equipment Utilized During Treatment: Gait belt Activity Tolerance: Patient tolerated treatment well Patient left: in chair;with call bell/phone within reach;with family/visitor present     Time: 9892-1194 PT Time Calculation (min) (ACUTE ONLY): 17 min  Charges:  $Gait Training: 8-22 mins                    G Codes:      Melford Aase 08/17/2015, 9:37 AM Elwyn Reach, [place]

## 2015-07-23 LAB — T3: T3, Total: 72 ng/dL (ref 71–180)

## 2015-07-24 LAB — H PYLORI, IGM, IGG, IGA AB
H Pylori IgG: 6.9 U/mL — ABNORMAL HIGH (ref 0.0–0.8)
H. PYLOGI, IGA ABS: 18.4 U — AB (ref 0.0–8.9)

## 2015-07-25 NOTE — Progress Notes (Addendum)
Receive notification through the EMR regarding updated labs. Note H pylori IgG Abs positive at 6.9 with H pylori Iga Abs positive at 18.4. In review of discharge AVS this result was not available at time of discharge therefore patient was not started on appropriate therapies. Prior to discharge it was known she was iron deficient with an iron level of 22 when she was started on iron supplementation this admission. Her B12 was also low at 200 with recommendations that patient may benefit from either oral or injectable B12 supplementation. During the hospitalization patient's TSH was noted to be 0.284 which was low and her free T4 was abnormal at 1.15 with a normal T3.This was presumed related to sick euthyroid syndrome. During the hospitalization patient had acute blood loss anemia and with associated low iron were concerns for possible GI blood loss therefore rationale for checking H. pylori serologies. Of note fecal occult blood on 4/29 was negative Recommendations were for the primary care physician Dr. Kathlen BrunswickMarsha White to obtain follow-up CBC and TSH upon routine hospital follow-up visit. Dr. Cliffton AstersWhite is in the [facility] and I have called the office and spoken with office staff of the above with request to speak to Dr. Cliffton AstersWhite who has been given my contact information. I have also obtained by fax number for the office and we'll print out a copy of this progress note fax to Dr. Lucilla LameWhite's office. FAX: [phone]  Junious SilkAllison Matraca Hunkins, ANP

## 2015-09-03 ENCOUNTER — Encounter (HOSPITAL_COMMUNITY): Payer: Self-pay

## 2015-09-03 ENCOUNTER — Emergency Department (HOSPITAL_COMMUNITY): Payer: Managed Care, Other (non HMO)

## 2015-09-03 ENCOUNTER — Emergency Department (HOSPITAL_COMMUNITY)
Admission: EM | Admit: 2015-09-03 | Discharge: 2015-09-03 | Disposition: A | Discharge status: Home / Self Care | Payer: Managed Care, Other (non HMO) | Attending: Emergency Medicine | Admitting: Emergency Medicine

## 2015-09-03 DIAGNOSIS — Y929 Unspecified place or not applicable: Secondary | ICD-10-CM | POA: Diagnosis not present

## 2015-09-03 DIAGNOSIS — Z96641 Presence of right artificial hip joint: Secondary | ICD-10-CM | POA: Insufficient documentation

## 2015-09-03 DIAGNOSIS — Y999 Unspecified external cause status: Secondary | ICD-10-CM | POA: Insufficient documentation

## 2015-09-03 DIAGNOSIS — Y939 Activity, unspecified: Secondary | ICD-10-CM | POA: Insufficient documentation

## 2015-09-03 DIAGNOSIS — T07XXXA Unspecified multiple injuries, initial encounter: Secondary | ICD-10-CM

## 2015-09-03 DIAGNOSIS — S80812A Abrasion, left lower leg, initial encounter: Secondary | ICD-10-CM | POA: Diagnosis not present

## 2015-09-03 DIAGNOSIS — Z7901 Long term (current) use of anticoagulants: Secondary | ICD-10-CM | POA: Diagnosis not present

## 2015-09-03 DIAGNOSIS — S90411A Abrasion, right great toe, initial encounter: Secondary | ICD-10-CM | POA: Diagnosis not present

## 2015-09-03 DIAGNOSIS — S40212A Abrasion of left shoulder, initial encounter: Secondary | ICD-10-CM | POA: Insufficient documentation

## 2015-09-03 DIAGNOSIS — S60311A Abrasion of right thumb, initial encounter: Secondary | ICD-10-CM | POA: Insufficient documentation

## 2015-09-03 DIAGNOSIS — W010XXA Fall on same level from slipping, tripping and stumbling without subsequent striking against object, initial encounter: Secondary | ICD-10-CM

## 2015-09-03 DIAGNOSIS — S0181XA Laceration without foreign body of other part of head, initial encounter: Secondary | ICD-10-CM | POA: Insufficient documentation

## 2015-09-03 DIAGNOSIS — W109XXA Fall (on) (from) unspecified stairs and steps, initial encounter: Secondary | ICD-10-CM | POA: Insufficient documentation

## 2015-09-03 DIAGNOSIS — Z87891 Personal history of nicotine dependence: Secondary | ICD-10-CM | POA: Insufficient documentation

## 2015-09-03 DIAGNOSIS — S0990XA Unspecified injury of head, initial encounter: Secondary | ICD-10-CM | POA: Diagnosis present

## 2015-09-03 MED ORDER — TETANUS-DIPHTH-ACELL PERTUSSIS 5-2.5-18.5 LF-MCG/0.5 IM SUSP
0.5000 mL | Freq: Once | INTRAMUSCULAR | Status: AC
  Administered 2015-09-03: 0.5 mL via INTRAMUSCULAR
  Filled 2015-09-03: qty 0.5

## 2015-09-03 MED ORDER — LIDOCAINE-EPINEPHRINE (PF) 2 %-1:200000 IJ SOLN
10.0000 mL | Freq: Once | INTRAMUSCULAR | Status: AC
  Administered 2015-09-03: 10 mL
  Filled 2015-09-03: qty 20

## 2015-09-03 NOTE — ED Notes (Signed)
Dr. Clarene DukeLittle informed about patients fall and gave telephone order for head and c-spine CTs scans.

## 2015-09-03 NOTE — ED Notes (Signed)
Pt states at 8pm tonight she tripped and fell down 3 steps and landed on gravel and rocks. Hematoma to left forehead with laceration. Bleeding controlled with bandaid. Pt A&Ox4, ambulatory. She reports headache. Has stopped taking Eliquis but Last dose of was 5 days ago. Unknown LOC.

## 2015-09-03 NOTE — ED Notes (Signed)
Pt also has abrasions to left shoulder, left leg, right hand and right greater toe.

## 2015-09-03 NOTE — ED Notes (Signed)
PA student at bedside to suture pt's forehead avulsion/laceration. Suture care, Lidocaine with Epi at bedside, per provider orders.

## 2015-09-03 NOTE — ED Notes (Signed)
Pt assisted with ambulation in hallway by Baxter HireKristen, EMT. Pt with steady gait.

## 2015-09-03 NOTE — ED Notes (Signed)
Pt returned from ct

## 2015-09-03 NOTE — ED Provider Notes (Signed)
CSN: [id number]     Arrival date & time 09/03/15  0002 History   First MD Initiated Contact with Patient 09/03/15 0353     Chief Complaint  Patient presents with  . Head Injury     (Consider location/radiation/quality/duration/timing/severity/associated sxs/prior Treatment) The history is provided by the patient and medical records. No language interpreter was used.     Haley Larson is a 65 y.o. female  with a hx of right hip arthroplasty, obesity, seasonal allergies presents to the Emergency Department complaining of acute, abrasions after losing her balance at the top of 3 stairs and falling onto gravel around 7:30pm tonight.  Pt recently had a hip replacement 6 weeks ago and has limited mobility of the right hip. She was previously on Eliquis but last dose was 5 days ago.  Denies dizziness, lightheadedness, syncope as a cause of the fall and after the fall.  Associated symptoms include pain at the sites of abrasions and lacerations.  No treatments PTA.  No aggravating or alleviating factors.  Last tetanus unkown.   No pain in the right hip.  Pt was ambulatory after the fall.   Past Medical History  Diagnosis Date  . Chest pain   . H/O: hysterectomy   . Dyslipidemia   . Postmenopausal   . Morbid obesity (HCC)   . Family history of early CAD     STRONG  . PONV (postoperative nausea and vomiting)   . Seasonal allergies   . Hypothyroidism   . Fibromyalgia   . GERD (gastroesophageal reflux disease)   . Arthritis    Past Surgical History  Procedure Laterality Date  . Rotator cuff repair  04/2007  . Rotator cuff repair  08/2007  . Gastric bypass  1977  . Abdominal hysterectomy    . Tonsillectomy    . Colonoscopy w/ polypectomy    . 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 Total hip arthroplasty Right 07/19/2015    Procedure: TOTAL HIP ARTHROPLASTY ANTERIOR APPROACH;  Surgeon: Loreta Ave, MD;  Location: [facility] OR;  Service: Orthopedics;  Laterality: Right;   Family History   Problem Relation Age of Onset  . Other Father     infection   . Coronary artery disease Mother     with stents  . Atrial fibrillation Mother     with pacemaker  . Heart failure Mother   . Diabetes Mother   . Heart attack Brother 38    massive  MI  . Heart attack Brother 54    x3  . Heart attack Brother 29    with stent  . Coronary artery disease Brother     with stent  . Alcohol abuse Brother   . Coronary artery disease Brother     with stents  . Heart attack Brother    Social History  Substance Use Topics  . Smoking status: Former Smoker -- 1.00 packs/day for 30 years    Types: Cigarettes    Quit date: 03/25/2006  . Smokeless tobacco: Never Used  . Alcohol Use: No   OB History    No data available     Review of Systems  Constitutional: Negative for fever, diaphoresis, appetite change, fatigue and unexpected weight change.  HENT: Negative for mouth sores.   Eyes: Negative for visual disturbance.  Respiratory: Negative for cough, chest tightness, shortness of breath and wheezing.   Cardiovascular: Negative for chest pain.  Gastrointestinal: Negative for nausea, vomiting, abdominal pain, diarrhea and constipation.  Endocrine: Negative  for polydipsia, polyphagia and polyuria.  Genitourinary: Negative for dysuria, urgency, frequency and hematuria.  Musculoskeletal: Negative for back pain and neck stiffness.  Skin: Positive for wound. Negative for rash.  Allergic/Immunologic: Negative for immunocompromised state.  Neurological: Positive for headaches (mild, generalized). Negative for syncope and light-headedness.  Hematological: Does not bruise/bleed easily.  Psychiatric/Behavioral: Negative for sleep disturbance. The patient is not nervous/anxious.       Allergies  Review of patient's allergies indicates no known allergies.  Home Medications   Prior to Admission medications   Medication Sig Start Date End Date Taking? Authorizing Provider  apixaban (ELIQUIS)  2.5 MG TABS tablet Take 1 tab po q12 hours x 35 days following surgery to prevent blood clots 07/19/15   Cristie Hem, PA-C  bisacodyl (DULCOLAX) 5 MG EC tablet Take 1 tablet (5 mg total) by mouth daily as needed for moderate constipation. 07/19/15   Cristie Hem, PA-C  docusate sodium (COLACE) 100 MG capsule Take 1 capsule (100 mg total) by mouth 2 (two) times daily as needed for mild constipation. 07/22/15   Rolly Salter, MD  DULoxetine (CYMBALTA) 60 MG capsule Take 60 mg by mouth daily.    Historical Provider, MD  estradiol (ESTRACE) 0.5 MG tablet Take 1 tablet by mouth daily. 10/27/13   Historical Provider, MD  ferrous sulfate 325 (65 FE) MG tablet Take 1 tablet (325 mg total) by mouth 2 (two) times daily with a meal. 07/22/15   Rolly Salter, MD  fluticasone (FLONASE) 50 MCG/ACT nasal spray Place 1 spray into the nose daily as needed for allergies.  09/02/12   Historical Provider, MD  HYDROcodone-acetaminophen (NORCO) 5-325 MG tablet Take 1-2 tablets by mouth every 4 (four) hours as needed for moderate pain. 07/20/15   Cristie Hem, PA-C  levothyroxine (SYNTHROID, LEVOTHROID) 88 MCG tablet Take 88 mcg by mouth daily before breakfast.  09/16/12   Historical Provider, MD  methocarbamol (ROBAXIN) 500 MG tablet Take 1 tablet (500 mg total) by mouth 4 (four) times daily. 07/19/15   Cristie Hem, PA-C  montelukast (SINGULAIR) 10 MG tablet Take 1 tablet by mouth at bedtime. 10/05/13   Historical Provider, MD  Multiple Vitamin (MULTIVITAMIN) tablet Take 1 tablet by mouth daily.    Historical Provider, MD  omeprazole (PRILOSEC) 40 MG capsule Take 1 capsule by mouth daily as needed. 10/19/13   Historical Provider, MD  ondansetron (ZOFRAN) 4 MG tablet Take 1 tablet (4 mg total) by mouth every 8 (eight) hours as needed for nausea or vomiting. 07/19/15   Cristie Hem, PA-C  polyethylene glycol (MIRALAX / GLYCOLAX) packet Take 17 g by mouth 2 (two) times daily. 07/22/15   Rolly Salter, MD  rosuvastatin  (CRESTOR) 10 MG tablet Take 10 mg by mouth daily.    Historical Provider, MD  senna (SENOKOT) 8.6 MG TABS tablet Take 1 tablet (8.6 mg total) by mouth at bedtime. 07/22/15   Rolly Salter, MD   BP 125/108 mmHg  Pulse 83  Temp(Src) 98.2 F (36.8 C) (Oral)  Resp 18  SpO2 99% Physical Exam  Constitutional: She is oriented to person, place, and time. She appears well-developed and well-nourished. No distress.  HENT:  Head: Normocephalic.  Mouth/Throat: Oropharynx is clear and moist.  Forehead laceration at the hairline on the left  Eyes: Conjunctivae and EOM are normal. Pupils are equal, round, and reactive to light. No scleral icterus.  No horizontal, vertical or rotational nystagmus  Neck: Normal range of  motion. Neck supple.  Full active and passive ROM without pain No midline or paraspinal tenderness No nuchal rigidity or meningeal signs  Cardiovascular: Normal rate, regular rhythm and intact distal pulses.   Pulmonary/Chest: Effort normal and breath sounds normal. No respiratory distress. She has no wheezes. She has no rales.  Abdominal: Soft. Bowel sounds are normal. There is no tenderness. There is no rebound and no guarding.  Musculoskeletal: Normal range of motion.  Left shoulder: Large abrasion and contusion with full range of motion Right hand: Full range of motion of all fingers  Lymphadenopathy:    She has no cervical adenopathy.  Neurological: She is alert and oriented to person, place, and time. She has normal reflexes. No cranial nerve deficit. She exhibits normal muscle tone. Coordination normal.  Mental Status:  Alert, oriented, thought content appropriate. Speech fluent without evidence of aphasia. Able to follow 2 step commands without difficulty.  Cranial Nerves:  II:  Peripheral visual fields grossly normal, pupils equal, round, reactive to light III,IV, VI: ptosis not present, extra-ocular motions intact bilaterally  V,VII: smile symmetric, facial light touch  sensation equal VIII: hearing grossly normal bilaterally  IX,X: midline uvula rise  XI: bilateral shoulder shrug equal and strong XII: midline tongue extension  Motor:  5/5 in upper and lower extremities bilaterally including strong and equal grip strength and dorsiflexion/plantar flexion Sensory: Pinprick and light touch normal in all extremities.  Deep Tendon Reflexes: 2+ and symmetric  Cerebellar: normal finger-to-nose with bilateral upper extremities Gait: normal gait and balance CV: distal pulses palpable throughout   Skin: Skin is warm and dry. No rash noted. She is not diaphoretic.  Left shoulder: large superficial abrasions Right thumb: 2 small abrasions Right great toe: small abrasions Left calf: abrasion  Psychiatric: She has a normal mood and affect. Her behavior is normal. Judgment and thought content normal.  Nursing note and vitals reviewed.   ED Course  .Marland Kitchen.Laceration Repair Date/Time: 09/03/2015 6:13 AM Performed by: Dierdre ForthMUTHERSBAUGH, Dalvin Clipper Authorized by: Dierdre ForthMUTHERSBAUGH, Assyria Morreale Consent: Verbal consent obtained. Risks and benefits: risks, benefits and alternatives were discussed Consent given by: patient Patient understanding: patient states understanding of the procedure being performed Patient consent: the patient's understanding of the procedure matches consent given Procedure consent: procedure consent matches procedure scheduled Relevant documents: relevant documents present and verified Site marked: the operative site was marked Required items: required blood products, implants, devices, and special equipment available Patient identity confirmed: verbally with patient Time out: Immediately prior to procedure a "time out" was called to verify the correct patient, procedure, equipment, support staff and site/side marked as required. Body area: head/neck Location details: forehead Laceration length: 2.8 cm Foreign bodies: no foreign bodies Tendon involvement:  none Nerve involvement: none Vascular damage: no Anesthesia: local infiltration Local anesthetic: lidocaine 2% with epinephrine Anesthetic total: 2 ml Patient sedated: no Preparation: Patient was prepped and draped in the usual sterile fashion. Irrigation solution: saline Irrigation method: syringe Amount of cleaning: extensive Wound skin closure material used: 5-0 Vicryl. Number of sutures: 3 Technique: simple Approximation: close Approximation difficulty: complex Dressing: 4x4 sterile gauze Patient tolerance: Patient tolerated the procedure well with no immediate complications   (including critical care time)  Imaging Review Ct Head Wo Contrast  09/03/2015  CLINICAL DATA:  65 year old female with head injury. EXAM: CT HEAD WITHOUT CONTRAST CT CERVICAL SPINE WITHOUT CONTRAST TECHNIQUE: Multidetector CT imaging of the head and cervical spine was performed following the standard protocol without intravenous contrast. Multiplanar CT image reconstructions of the  cervical spine were also generated. COMPARISON:  None. FINDINGS: CT HEAD FINDINGS There is mild age-related atrophy and chronic microvascular ischemic changes. Small left basal ganglia old lacunar infarct noted. There is no acute intracranial hemorrhage. No mass effect or midline shift. The visualized paranasal sinuses and mastoid air cells are clear. The calvarium is intact. CT CERVICAL SPINE FINDINGS There is no acute fracture or subluxation of the cervical spine.There is mild reversal of normal cervical lordosis which may be positional or due to muscle spasm.The odontoid and spinous processes are intact.There is normal anatomic alignment of the C1-C2 lateral masses. The visualized soft tissues appear unremarkable. IMPRESSION: No acute intracranial hemorrhage. No acute/ traumatic cervical spine pathology. Electronically Signed   By: Elgie Collard M.D.   On: 09/03/2015 01:57   Ct Cervical Spine Wo Contrast  09/03/2015  CLINICAL  DATA:  65 year old female with head injury. EXAM: CT HEAD WITHOUT CONTRAST CT CERVICAL SPINE WITHOUT CONTRAST TECHNIQUE: Multidetector CT imaging of the head and cervical spine was performed following the standard protocol without intravenous contrast. Multiplanar CT image reconstructions of the cervical spine were also generated. COMPARISON:  None. FINDINGS: CT HEAD FINDINGS There is mild age-related atrophy and chronic microvascular ischemic changes. Small left basal ganglia old lacunar infarct noted. There is no acute intracranial hemorrhage. No mass effect or midline shift. The visualized paranasal sinuses and mastoid air cells are clear. The calvarium is intact. CT CERVICAL SPINE FINDINGS There is no acute fracture or subluxation of the cervical spine.There is mild reversal of normal cervical lordosis which may be positional or due to muscle spasm.The odontoid and spinous processes are intact.There is normal anatomic alignment of the C1-C2 lateral masses. The visualized soft tissues appear unremarkable. IMPRESSION: No acute intracranial hemorrhage. No acute/ traumatic cervical spine pathology. Electronically Signed   By: Elgie Collard M.D.   On: 09/03/2015 01:57   I have personally reviewed and evaluated these images and lab results as part of my medical decision-making.   MDM   Final diagnoses:  Fall from slip, trip, or stumble, initial encounter  Abrasions of multiple sites  Laceration of forehead, initial encounter   FONDA ROCHON presents after mechanical fall.  No LOC.  No midline Cervical tenderness. Full range of motion. No evidence of head injury. CT scans showed no intercranial hemorrhage or cervical spine fracture.  Pressure irrigation performed. Wound explored and base of wound visualized in a bloodless field without evidence of foreign body.  Laceration occurred < 8 hours prior to repair which was well tolerated. Tdap updated.  Pt has no comorbidities to effect normal wound  healing. Pt discharged without antibiotics.  Discussed suture home care with patient and answered questions. Pt to follow-up for wound check in 5 days; they are to return to the ED sooner for signs of infection.  Patient ambulates in the hallway with steady gait while using her cane. She reports she feels well. No dizziness.   Pt is hemodynamically stable with no complaints prior to dc.  BP 117/67 mmHg  Pulse 86  Temp(Src) 98.2 F (36.8 C) (Oral)  Resp 18  SpO2 99%   Dierdre Forth, PA-C 09/03/15 [id number]  Dione Booze, MD 09/03/15 [id number]

## 2015-09-03 NOTE — Discharge Instructions (Signed)
1. Medications: Tylenol or ibuprofen for pain, usual home medications °2. Treatment: ice for swelling, keep wound clean with warm soap and water and keep bandage dry, do not submerge in water for 24 hours °3. Follow Up: Please se your PCP in 5 days to have your stitches/staples removed or sooner if you have concerns. Return to the emergency department for increased redness, drainage of pus from the wound ° ° °WOUND CARE °• Keep area clean and dry for 24 hours. Do not remove bandage, if applied. °• After 24 hours, remove bandage and wash wound gently with mild soap and warm water. Reapply a new bandage after cleaning wound, if directed.  °• Continue daily cleansing with soap and water until stitches/staples are removed. °• Do not apply any ointments or creams to the wound while stitches/staples are in place, as this may cause delayed healing. °•Return if you experience any of the following signs of infection: Swelling, redness, pus drainage, streaking, fever >101.0 F °• Return if you experience excessive bleeding that does not stop after 15-20 minutes of constant, firm pressure. ° °

## 2015-09-22 ENCOUNTER — Encounter: Payer: Self-pay | Admitting: Physician Assistant

## 2015-09-22 ENCOUNTER — Ambulatory Visit (INDEPENDENT_AMBULATORY_CARE_PROVIDER_SITE_OTHER): Payer: Managed Care, Other (non HMO) | Admitting: Physician Assistant

## 2015-09-22 VITALS — BP 136/76 | HR 72 | Ht 64.0 in | Wt 230.2 lb

## 2015-09-22 DIAGNOSIS — R0609 Other forms of dyspnea: Secondary | ICD-10-CM | POA: Diagnosis not present

## 2015-09-22 DIAGNOSIS — R011 Cardiac murmur, unspecified: Secondary | ICD-10-CM

## 2015-09-22 NOTE — Progress Notes (Signed)
Cardiology Office Note   Date:  09/22/2015   ID:  Haley, Larson 1950-08-19, MRN [id number]  PCP:  WHITE, Haley Public, NP  Cardiologist:  Dr [place]  Haley Lagerstrom, PA-C   Chief Complaint  Patient presents with  . Follow-up    sob; when laying down.     History of Present Illness: Haley Larson is a 65 y.o. female with a history of morbid obesity, dyspnea, FH CAD, nl MV and echo 2014, HLD, gastric bypass, 06/2015 R-THR w/ abl anemia (TSH, B12, and iron levels were all low that admit).  Haley Larson presents for Follow-up after her hospitalization and evaluation of a systolic murmur.  Since discharge from the hospital, [name] has worked very hard and rehabbing her leg. She has had some struggles with this but in general feels that she is making good progress. While she was in the hospital, she was told she had a systolic murmur. The physicians that told her this were very concerned about it and wanted her to follow up with cardiology. She never gets lightheaded or dizzy. She has no history of presyncope or syncope. She never gets chest pain. She does not have dyspnea on exertion and feels that she is able to push herself doing rehabilitation without any symptoms. She had mild right lower extremity edema after the surgery, but this has improved. She took gallop was for a month after the surgery but has since stopped. She feels that she is active on a daily basis. She has no history of orthopnea or PND. She never gets palpitations.  She has not yet followed up with her family physician for the abnormal labs listed above, but she has an appointment.    Past Medical History  Diagnosis Date  . Chest pain   . H/O: hysterectomy   . Dyslipidemia   . Postmenopausal   . Morbid obesity (HCC)   . Family history of early CAD     STRONG  . PONV (postoperative nausea and vomiting)   . Seasonal allergies   . Hypothyroidism   . Fibromyalgia   . GERD (gastroesophageal reflux  disease)   . Arthritis     Past Surgical History  Procedure Laterality Date  . Rotator cuff repair  04/2007  . Rotator cuff repair  08/2007  . Gastric bypass  1977  . Abdominal hysterectomy    . Tonsillectomy    . Colonoscopy w/ polypectomy    . 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 Total hip arthroplasty Right 07/19/2015    Procedure: TOTAL HIP ARTHROPLASTY ANTERIOR APPROACH;  Surgeon: Loreta Ave, MD;  [facility] OR;  Service: Orthopedics;  Laterality: Right;    Current Outpatient Prescriptions  Medication Sig Dispense Refill  . DULoxetine (CYMBALTA) 60 MG capsule Take 60 mg by mouth daily.    Marland Kitchen estradiol (ESTRACE) 0.5 MG tablet Take 1 tablet by mouth daily.    . ferrous sulfate 325 (65 FE) MG tablet Take 1 tablet (325 mg total) by mouth 2 (two) times daily with a meal. 60 tablet 0  . fluticasone (FLONASE) 50 MCG/ACT nasal spray Place 1 spray into the nose daily as needed for allergies.     Marland Kitchen HYDROcodone-acetaminophen (NORCO) 5-325 MG tablet Take 1-2 tablets by mouth every 4 (four) hours as needed for moderate pain. 60 tablet 0  . levothyroxine (SYNTHROID, LEVOTHROID) 88 MCG tablet Take 88 mcg by mouth daily before breakfast.     . montelukast (SINGULAIR) 10  MG tablet Take 1 tablet by mouth at bedtime.    . Multiple Vitamin (MULTIVITAMIN) tablet Take 1 tablet by mouth daily.    . polyethylene glycol (MIRALAX / GLYCOLAX) packet Take 17 g by mouth 2 (two) times daily. 14 each 0  . rosuvastatin (CRESTOR) 10 MG tablet Take 10 mg by mouth daily.     No current facility-administered medications for this visit.    Allergies:   Review of patient's allergies indicates no known allergies.    Social History:  The patient  reports that she quit smoking about 9 years ago. Her smoking use included Cigarettes. She has a 30 pack-year smoking history. She has never used smokeless tobacco. She reports that she does not drink alcohol or use illicit drugs.   Family History:  The  patient's family history includes Alcohol abuse in her brother; Atrial fibrillation in her mother; Coronary artery disease in her brother, brother, and mother; Diabetes in her mother; Heart attack in her brother; Heart attack (age of onset: 1538) in her brother; Heart attack (age of onset: 1854) in her brother; Heart attack (age of onset: 5558) in her brother; Heart failure in her mother; Other in her father.    ROS:  Please see the history of present illness. All other systems are reviewed and negative.    PHYSICAL EXAM: VS:  BP 136/76 mmHg  Pulse 72  Ht 5\' 4"  (1.626 m)  Wt 230 lb 3.2 oz (104.418 kg)  BMI 39.49 kg/m2 , BMI Body mass index is 39.49 kg/(m^2). GEN: Well nourished, well developed, female in no acute distress HEENT: normal for age  Neck: no JVD, no carotid bruit, no masses Cardiac: RRR; 2/6 murmur left upper sternal border, no rubs, or gallops Respiratory:  clear to auscultation bilaterally, normal work of breathing GI: soft, nontender, nondistended, + BS MS: no deformity or atrophy; no edema; distal pulses are 2+ in all 4 extremities  Skin: warm and dry, no rash Neuro:  Strength and sensation are intact Psych: euthymic mood, full affect   EKG:  EKG is ordered today. The ekg ordered today demonstrates sinus rhythm, rate 72, normal intervals, no acute ischemic changes   Recent Labs: 07/21/2015: TSH 0.284* 07/22/2015: BUN 38*; Creatinine, Ser 1.66*; Hemoglobin 8.7*; Platelets 160; Potassium 4.4; Sodium 135    Lipid Panel No results found for: CHOL, TRIG, HDL, CHOLHDL, VLDL, LDLCALC, LDLDIRECT   Wt Readings from Last 3 Encounters:  09/22/15 230 lb 3.2 oz (104.418 kg)  07/19/15 239 lb (108.41 kg)  07/07/15 239 lb 10.2 oz (108.7 kg)     Other studies Reviewed: Additional studies/ records that were reviewed today include: Office notes, hospital records and testing   ASSESSMENT AND PLAN:  1.  Systolic ejection murmur: She may have a little aortic sclerosis, but I do not  believe this is a pathologic murmur. An echocardiogram was performed in 2014 that showed no valvular abnormalities. She can be followed symptomatically for this. If her murmur becomes more indicative of aortic stenosis or she becomes symptomatic, an echocardiogram and further evaluation can be performed at that time.   for now, she is having no symptoms of coronary artery disease or heart failure. She is to continue current care and follow-up in one year   Current medicines are reviewed at length with the patient today.  The patient does not have concerns regarding medicines.  The following changes have been made:  no change  Labs/ tests ordered today include:   Orders Placed This  Encounter  Procedures  . EKG 12-Lead     Disposition:   FU with Dr. SwazilandJordan   Signed, Theodore DemarkBarrett, Ezariah Nace, PA-C  09/22/2015 1:52 PM    [facility] Phone: [phone]; Fax: [phone]  This note was written with the assistance of speech recognition software. Please excuse any transcriptional errors.

## 2015-09-22 NOTE — Patient Instructions (Signed)
Your physician wants you to follow-up in: 1 year with Dr SwazilandJordan. You will receive a reminder letter in the mail two months in advance. If you don't receive a letter, please call our office to schedule the follow-up appointment. No changes were made today in your therapy. Continue medications as prescribed.

## 2015-10-15 ENCOUNTER — Encounter (HOSPITAL_COMMUNITY): Payer: Self-pay

## 2015-10-15 ENCOUNTER — Emergency Department (HOSPITAL_COMMUNITY): Payer: Managed Care, Other (non HMO)

## 2015-10-15 ENCOUNTER — Inpatient Hospital Stay (HOSPITAL_COMMUNITY)
Admission: EM | Admit: 2015-10-15 | Discharge: 2015-10-24 | DRG: 854 | Disposition: A | Discharge status: Home / Self Care | Payer: Managed Care, Other (non HMO) | Attending: Internal Medicine | Admitting: Internal Medicine

## 2015-10-15 DIAGNOSIS — Z96649 Presence of unspecified artificial hip joint: Secondary | ICD-10-CM

## 2015-10-15 DIAGNOSIS — K567 Ileus, unspecified: Secondary | ICD-10-CM

## 2015-10-15 DIAGNOSIS — R140 Abdominal distension (gaseous): Secondary | ICD-10-CM

## 2015-10-15 DIAGNOSIS — K56609 Unspecified intestinal obstruction, unspecified as to partial versus complete obstruction: Secondary | ICD-10-CM

## 2015-10-15 DIAGNOSIS — Z833 Family history of diabetes mellitus: Secondary | ICD-10-CM

## 2015-10-15 DIAGNOSIS — Y838 Other surgical procedures as the cause of abnormal reaction of the patient, or of later complication, without mention of misadventure at the time of the procedure: Secondary | ICD-10-CM | POA: Diagnosis not present

## 2015-10-15 DIAGNOSIS — Z419 Encounter for procedure for purposes other than remedying health state, unspecified: Secondary | ICD-10-CM

## 2015-10-15 DIAGNOSIS — A419 Sepsis, unspecified organism: Secondary | ICD-10-CM | POA: Diagnosis not present

## 2015-10-15 DIAGNOSIS — E785 Hyperlipidemia, unspecified: Secondary | ICD-10-CM | POA: Diagnosis present

## 2015-10-15 DIAGNOSIS — K812 Acute cholecystitis with chronic cholecystitis: Secondary | ICD-10-CM | POA: Diagnosis present

## 2015-10-15 DIAGNOSIS — Z87891 Personal history of nicotine dependence: Secondary | ICD-10-CM

## 2015-10-15 DIAGNOSIS — Z9884 Bariatric surgery status: Secondary | ICD-10-CM

## 2015-10-15 DIAGNOSIS — R739 Hyperglycemia, unspecified: Secondary | ICD-10-CM | POA: Diagnosis present

## 2015-10-15 DIAGNOSIS — E871 Hypo-osmolality and hyponatremia: Secondary | ICD-10-CM | POA: Diagnosis present

## 2015-10-15 DIAGNOSIS — M797 Fibromyalgia: Secondary | ICD-10-CM | POA: Diagnosis present

## 2015-10-15 DIAGNOSIS — K8309 Other cholangitis: Secondary | ICD-10-CM

## 2015-10-15 DIAGNOSIS — E876 Hypokalemia: Secondary | ICD-10-CM

## 2015-10-15 DIAGNOSIS — Z6841 Body Mass Index (BMI) 40.0 and over, adult: Secondary | ICD-10-CM

## 2015-10-15 DIAGNOSIS — K913 Postprocedural intestinal obstruction: Secondary | ICD-10-CM | POA: Diagnosis not present

## 2015-10-15 DIAGNOSIS — K219 Gastro-esophageal reflux disease without esophagitis: Secondary | ICD-10-CM | POA: Diagnosis present

## 2015-10-15 DIAGNOSIS — Z79891 Long term (current) use of opiate analgesic: Secondary | ICD-10-CM

## 2015-10-15 DIAGNOSIS — K8020 Calculus of gallbladder without cholecystitis without obstruction: Secondary | ICD-10-CM

## 2015-10-15 DIAGNOSIS — E860 Dehydration: Secondary | ICD-10-CM | POA: Diagnosis present

## 2015-10-15 DIAGNOSIS — Y92239 Unspecified place in hospital as the place of occurrence of the external cause: Secondary | ICD-10-CM | POA: Diagnosis not present

## 2015-10-15 DIAGNOSIS — R945 Abnormal results of liver function studies: Secondary | ICD-10-CM

## 2015-10-15 DIAGNOSIS — M1990 Unspecified osteoarthritis, unspecified site: Secondary | ICD-10-CM | POA: Diagnosis present

## 2015-10-15 DIAGNOSIS — Z811 Family history of alcohol abuse and dependence: Secondary | ICD-10-CM

## 2015-10-15 DIAGNOSIS — G8929 Other chronic pain: Secondary | ICD-10-CM | POA: Diagnosis present

## 2015-10-15 DIAGNOSIS — Z8249 Family history of ischemic heart disease and other diseases of the circulatory system: Secondary | ICD-10-CM

## 2015-10-15 DIAGNOSIS — K830 Cholangitis: Secondary | ICD-10-CM | POA: Diagnosis not present

## 2015-10-15 DIAGNOSIS — K8000 Calculus of gallbladder with acute cholecystitis without obstruction: Secondary | ICD-10-CM | POA: Diagnosis present

## 2015-10-15 DIAGNOSIS — R112 Nausea with vomiting, unspecified: Secondary | ICD-10-CM

## 2015-10-15 DIAGNOSIS — R1011 Right upper quadrant pain: Secondary | ICD-10-CM

## 2015-10-15 DIAGNOSIS — Z96641 Presence of right artificial hip joint: Secondary | ICD-10-CM | POA: Diagnosis present

## 2015-10-15 DIAGNOSIS — Z7982 Long term (current) use of aspirin: Secondary | ICD-10-CM

## 2015-10-15 DIAGNOSIS — K828 Other specified diseases of gallbladder: Secondary | ICD-10-CM | POA: Diagnosis present

## 2015-10-15 DIAGNOSIS — R7989 Other specified abnormal findings of blood chemistry: Secondary | ICD-10-CM | POA: Diagnosis present

## 2015-10-15 DIAGNOSIS — I10 Essential (primary) hypertension: Secondary | ICD-10-CM | POA: Diagnosis present

## 2015-10-15 DIAGNOSIS — E039 Hypothyroidism, unspecified: Secondary | ICD-10-CM | POA: Diagnosis present

## 2015-10-15 DIAGNOSIS — Z79899 Other long term (current) drug therapy: Secondary | ICD-10-CM

## 2015-10-15 LAB — CBC WITH DIFFERENTIAL/PLATELET
BASOS ABS: 0 10*3/uL (ref 0.0–0.1)
BASOS PCT: 0 %
EOS ABS: 0 10*3/uL (ref 0.0–0.7)
EOS PCT: 0 %
HCT: 43.6 % (ref 36.0–46.0)
Hemoglobin: 14.2 g/dL (ref 12.0–15.0)
LYMPHS ABS: 0.6 10*3/uL — AB (ref 0.7–4.0)
Lymphocytes Relative: 5 %
MCH: 30.2 pg (ref 26.0–34.0)
MCHC: 32.6 g/dL (ref 30.0–36.0)
MCV: 92.8 fL (ref 78.0–100.0)
MONO ABS: 0.9 10*3/uL (ref 0.1–1.0)
Monocytes Relative: 7 %
NEUTROS PCT: 88 %
Neutro Abs: 11.7 10*3/uL — ABNORMAL HIGH (ref 1.7–7.7)
PLATELETS: 210 10*3/uL (ref 150–400)
RBC: 4.7 MIL/uL (ref 3.87–5.11)
RDW: 13.4 % (ref 11.5–15.5)
WBC: 13.2 10*3/uL — ABNORMAL HIGH (ref 4.0–10.5)

## 2015-10-15 LAB — URINALYSIS, ROUTINE W REFLEX MICROSCOPIC
GLUCOSE, UA: 100 mg/dL — AB
KETONES UR: NEGATIVE mg/dL
LEUKOCYTES UA: NEGATIVE
NITRITE: NEGATIVE
PROTEIN: 100 mg/dL — AB
Specific Gravity, Urine: 1.023 (ref 1.005–1.030)
pH: 7 (ref 5.0–8.0)

## 2015-10-15 LAB — COMPREHENSIVE METABOLIC PANEL
ALBUMIN: 3.4 g/dL — AB (ref 3.5–5.0)
ALK PHOS: 286 U/L — AB (ref 38–126)
ALT: 106 U/L — ABNORMAL HIGH (ref 14–54)
AST: 147 U/L — AB (ref 15–41)
Anion gap: 10 (ref 5–15)
BILIRUBIN TOTAL: 1.6 mg/dL — AB (ref 0.3–1.2)
BUN: 9 mg/dL (ref 6–20)
CO2: 24 mmol/L (ref 22–32)
Calcium: 8.8 mg/dL — ABNORMAL LOW (ref 8.9–10.3)
Chloride: 99 mmol/L — ABNORMAL LOW (ref 101–111)
Creatinine, Ser: 0.88 mg/dL (ref 0.44–1.00)
GFR calc Af Amer: >60 mL/min (ref >60)
GFR calc non Af Amer: >60 mL/min (ref >60)
GLUCOSE: 235 mg/dL — AB (ref 65–99)
POTASSIUM: 3.6 mmol/L (ref 3.5–5.1)
Sodium: 133 mmol/L — ABNORMAL LOW (ref 135–145)
TOTAL PROTEIN: 7.2 g/dL (ref 6.5–8.1)

## 2015-10-15 LAB — LIPASE, BLOOD: LIPASE: 18 U/L (ref 11–51)

## 2015-10-15 LAB — I-STAT CG4 LACTIC ACID, ED
LACTIC ACID, VENOUS: 1.85 mmol/L (ref 0.5–1.9)
Lactic Acid, Venous: 2.89 mmol/L (ref 0.5–1.9)

## 2015-10-15 LAB — URINE MICROSCOPIC-ADD ON

## 2015-10-15 MED ORDER — SODIUM CHLORIDE 0.9 % IV BOLUS (SEPSIS)
1000.0000 mL | Freq: Once | INTRAVENOUS | Status: AC
  Administered 2015-10-15: 1000 mL via INTRAVENOUS

## 2015-10-15 MED ORDER — ACETAMINOPHEN 500 MG PO TABS
1000.0000 mg | ORAL_TABLET | Freq: Once | ORAL | Status: AC
  Administered 2015-10-15: 1000 mg via ORAL
  Filled 2015-10-15: qty 2

## 2015-10-15 MED ORDER — PIPERACILLIN-TAZOBACTAM 3.375 G IVPB 30 MIN
3.3750 g | Freq: Once | INTRAVENOUS | Status: AC
  Administered 2015-10-15: 3.375 g via INTRAVENOUS
  Filled 2015-10-15: qty 50

## 2015-10-15 MED ORDER — ONDANSETRON HCL 4 MG/2ML IJ SOLN
4.0000 mg | Freq: Once | INTRAMUSCULAR | Status: AC
  Administered 2015-10-15: 4 mg via INTRAVENOUS

## 2015-10-15 MED ORDER — SODIUM CHLORIDE 0.9 % IV SOLN
Freq: Once | INTRAVENOUS | Status: AC
  Administered 2015-10-15: 23:00:00 via INTRAVENOUS

## 2015-10-15 MED ORDER — IOPAMIDOL (ISOVUE-300) INJECTION 61%
INTRAVENOUS | Status: AC
  Administered 2015-10-15: 100 mL
  Filled 2015-10-15: qty 100

## 2015-10-15 MED ORDER — ONDANSETRON HCL 4 MG/2ML IJ SOLN
INTRAMUSCULAR | Status: AC
  Filled 2015-10-15: qty 2

## 2015-10-15 NOTE — ED Provider Notes (Signed)
[facility]-EMERGENCY DEPT Provider Note   CSN: [id number] Arrival date & time: 10/15/15  1807  First Provider Contact:  None       History   Chief Complaint Chief Complaint  Patient presents with  . Emesis  . Nausea    HPI Haley Larson is a 65 y.o. female.  The history is provided by the patient and the spouse.  Emesis   This is a new problem. The current episode started more than 2 days ago. The problem occurs 2 to 4 times per day. The maximum temperature recorded prior to her arrival was 100 to 100.9 F. Associated symptoms include abdominal pain, chills, a fever, headaches, myalgias and sweats. Pertinent negatives include no cough, no diarrhea and no URI. Risk factors include ill contacts, suspect food intake and travel to endemic areas.  Abdominal Pain   This is a new problem. The current episode started more than 2 days ago. The problem occurs constantly. The problem has not changed since onset.The pain is located in the epigastric region and RUQ. The quality of the pain is aching and dull. The pain is at a severity of 7/10. Associated symptoms include fever, vomiting, headaches and myalgias. Pertinent negatives include diarrhea and dysuria. The symptoms are aggravated by certain positions, deep breathing, palpation, eating and vomiting. The symptoms are relieved by certain positions.    Past Medical History:  Diagnosis Date  . Arthritis   . Chest pain   . Dyslipidemia   . Family history of early CAD    STRONG  . Fibromyalgia   . GERD (gastroesophageal reflux disease)   . H/O: hysterectomy   . Hypothyroidism   . Morbid obesity (HCC)   . PONV (postoperative nausea and vomiting)   . Postmenopausal   . Seasonal allergies     Patient Active Problem List   Diagnosis Date Noted  . Cholelithiasis with acute cholangitis 10/16/2015  . Hyponatremia 10/16/2015  . Hyperglycemia 10/16/2015  . Sepsis (HCC) 10/16/2015  . Acute cholangitis 10/16/2015  . Symptomatic anemia  07/21/2015  . Acute kidney injury (HCC) 07/21/2015  . History of gastric bypass 07/21/2015  . Hypothyroidism 07/21/2015  . Systolic murmur 07/21/2015  . Leukocytosis 07/21/2015  . GERD (gastroesophageal reflux disease) 07/21/2015  . Obesity, morbid, BMI 40.0-49.9 (HCC) 07/21/2015  . Status post THR (total hip replacement) 07/19/2015  . Chest pain 10/14/2012  . Dyspnea on exertion 10/14/2012  . Hyperlipidemia 10/14/2012  . Morbid obesity (HCC) 10/14/2012    Past Surgical History:  Procedure Laterality Date  . ABDOMINAL HYSTERECTOMY    . COLONOSCOPY W/ POLYPECTOMY    . GASTRIC BYPASS  1977  . ROTATOR CUFF REPAIR  04/2007  . ROTATOR CUFF REPAIR  08/2007  . TONSILLECTOMY    . TOTAL HIP ARTHROPLASTY Right 07/19/2015  . TOTAL HIP ARTHROPLASTY Right 07/19/2015   Procedure: TOTAL HIP ARTHROPLASTY ANTERIOR APPROACH;  Surgeon: Loreta Ave, MD;  Location: [facility] OR;  Service: Orthopedics;  Laterality: Right;    OB History    No data available       Home Medications    Prior to Admission medications   Medication Sig Start Date End Date Taking? Authorizing Provider  acetaminophen (TYLENOL) 500 MG tablet Take 500-1,000 mg by mouth every 6 (six) hours as needed for mild pain, fever or headache.   Yes Historical Provider, MD  aspirin EC 81 MG tablet Take 81 mg by mouth daily.   Yes Historical Provider, MD  estradiol (ESTRACE) 0.5 MG tablet Take  1 tablet by mouth daily. 10/27/13  Yes Historical Provider, MD  HYDROcodone-acetaminophen (NORCO) 5-325 MG tablet Take 1-2 tablets by mouth every 4 (four) hours as needed for moderate pain. Patient taking differently: Take 1 tablet by mouth 2 (two) times daily as needed for moderate pain.  07/20/15  Yes Cristie Hem, PA-C  levothyroxine (SYNTHROID, LEVOTHROID) 88 MCG tablet Take 88 mcg by mouth daily before breakfast.  09/16/12  Yes Historical Provider, MD  methocarbamol (ROBAXIN) 500 MG tablet Take 500 mg by mouth See admin instructions. Every 8-12  hours as needed for spasms   Yes Historical Provider, MD  montelukast (SINGULAIR) 10 MG tablet Take 1 tablet by mouth at bedtime. 10/05/13  Yes Historical Provider, MD  ondansetron (ZOFRAN-ODT) 8 MG disintegrating tablet Take 8 mg by mouth See admin instructions. Every 8-12 hours as needed for nausea/vomiting   Yes Historical Provider, MD  pantoprazole (PROTONIX) 40 MG tablet Take 40 mg by mouth 2 (two) times daily.   Yes Historical Provider, MD  pregabalin (LYRICA) 150 MG capsule Take 150 mg by mouth See admin instructions. One to two times daily   Yes Historical Provider, MD  rosuvastatin (CRESTOR) 10 MG tablet Take 10 mg by mouth every evening. 09/28/15  Yes Historical Provider, MD  DULoxetine (CYMBALTA) 60 MG capsule Take 60 mg by mouth daily.    Historical Provider, MD  ferrous sulfate 325 (65 FE) MG tablet Take 1 tablet (325 mg total) by mouth 2 (two) times daily with a meal. Patient not taking: Reported on 10/15/2015 07/22/15   Rolly Salter, MD  polyethylene glycol (MIRALAX / Ethelene Hal) packet Take 17 g by mouth 2 (two) times daily. Patient not taking: Reported on 10/15/2015 07/22/15   Rolly Salter, MD    Family History Family History  Problem Relation Age of Onset  . Other Father     infection   . Coronary artery disease Mother     with stents  . Atrial fibrillation Mother     with pacemaker  . Heart failure Mother   . Diabetes Mother   . Heart attack Brother   . Heart attack Brother 38    massive  MI  . Heart attack Brother 54    x3  . Heart attack Brother 80    with stent  . Coronary artery disease Brother     with stent  . Alcohol abuse Brother   . Coronary artery disease Brother     with stents    Social History Social History  Substance Use Topics  . Smoking status: Former Smoker    Packs/day: 1.00    Years: 30.00    Types: Cigarettes    Quit date: 03/25/2006  . Smokeless tobacco: Never Used  . Alcohol use No     Allergies   Review of patient's allergies  indicates no known allergies.   Review of Systems Review of Systems  Constitutional: Positive for chills and fever.  HENT: Positive for congestion.   Eyes: Negative for visual disturbance.  Respiratory: Negative for cough.   Gastrointestinal: Positive for abdominal pain and vomiting. Negative for diarrhea.  Genitourinary: Positive for vaginal discharge. Negative for decreased urine volume, dysuria, urgency and vaginal bleeding.  Musculoskeletal: Positive for myalgias. Negative for back pain, neck pain and neck stiffness.  Neurological: Positive for light-headedness and headaches. Negative for numbness.  Psychiatric/Behavioral: Negative for agitation and confusion.     Physical Exam Updated Vital Signs BP (!) 108/47 (BP Location: Left Arm)  Pulse (!) 108   Temp 98.2 F (36.8 C) (Oral)   Resp 20   Ht 5\' 4"  (1.626 m)   Wt 109.5 kg   SpO2 94%   BMI 41.42 kg/m   Physical Exam  Constitutional: She appears well-developed and well-nourished. No distress.  HENT:  Head: Normocephalic and atraumatic.  Eyes: Conjunctivae are normal.  Neck: Neck supple.  Cardiovascular: Regular rhythm.   No murmur heard. tachycardic  Pulmonary/Chest: Effort normal and breath sounds normal. No respiratory distress.  Abdominal: Soft. She exhibits no mass. There is tenderness (epigastrium and RUQ). There is no rebound and no guarding.  Positive murphy's sign  Musculoskeletal: She exhibits no edema.  Neurological: She is alert. No cranial nerve deficit.  Skin: Skin is warm and dry.  Psychiatric: She has a normal mood and affect. Her behavior is normal.  Nursing note and vitals reviewed.    ED Treatments / Results  Labs (all labs ordered are listed, but only abnormal results are displayed) Labs Reviewed  COMPREHENSIVE METABOLIC PANEL - Abnormal; Notable for the following:       Result Value   Sodium 133 (*)    Chloride 99 (*)    Glucose, Bld 235 (*)    Calcium 8.8 (*)    Albumin 3.4 (*)     AST 147 (*)    ALT 106 (*)    Alkaline Phosphatase 286 (*)    Total Bilirubin 1.6 (*)    All other components within normal limits  CBC WITH DIFFERENTIAL/PLATELET - Abnormal; Notable for the following:    WBC 13.2 (*)    Neutro Abs 11.7 (*)    Lymphs Abs 0.6 (*)    All other components within normal limits  URINALYSIS, ROUTINE W REFLEX MICROSCOPIC (NOT AT [facility]) - Abnormal; Notable for the following:    Glucose, UA 100 (*)    Hgb urine dipstick TRACE (*)    Bilirubin Urine MODERATE (*)    Protein, ur 100 (*)    All other components within normal limits  URINE MICROSCOPIC-ADD ON - Abnormal; Notable for the following:    Squamous Epithelial / LPF 0-5 (*)    Bacteria, UA RARE (*)    All other components within normal limits  I-STAT CG4 LACTIC ACID, ED - Abnormal; Notable for the following:    Lactic Acid, Venous 2.89 (*)    All other components within normal limits  CULTURE, BLOOD (ROUTINE X 2)  CULTURE, BLOOD (ROUTINE X 2)  URINE CULTURE  LIPASE, BLOOD  CBC WITH DIFFERENTIAL/PLATELET  COMPREHENSIVE METABOLIC PANEL  PROCALCITONIN  APTT  PROTIME-INR  HEMOGLOBIN A1C  I-STAT CG4 LACTIC ACID, ED  I-STAT CG4 LACTIC ACID, ED  TYPE AND SCREEN    EKG  EKG Interpretation  Date/Time:  Sunday October 15 2015 18:19:05 EDT Ventricular Rate:  124 PR Interval:  148 QRS Duration: 86 QT Interval:  298 QTC Calculation: 428 R Axis:   -23 Text Interpretation:  Sinus tachycardia Nonspecific ST abnormality Abnormal ECG Confirmed by ZAVITZ MD, JOSHUA [id number]) on 10/15/2015 9:46:33 PM       Radiology Dg Chest 2 View  Result Date: 10/15/2015 CLINICAL DATA:  Fever, shortness of breath and chest pain for 1 day. EXAM: CHEST  2 VIEW COMPARISON:  CT chest and PA and lateral chest 01/30/2010. FINDINGS: The lungs are clear. Heart size is normal. No pneumothorax or pleural effusion. No focal bony abnormality. Surgical clips left upper quadrant of the abdomen are noted. IMPRESSION: No acute disease.  Electronically  Signed   By: Drusilla Kanner M.D.   On: 10/15/2015 19:09  Ct Abdomen Pelvis W Contrast  Result Date: 10/15/2015 CLINICAL DATA:  Generalized abdominal pain and nausea. EXAM: CT ABDOMEN AND PELVIS WITH CONTRAST TECHNIQUE: Multidetector CT imaging of the abdomen and pelvis was performed using the standard protocol following bolus administration of intravenous contrast. CONTRAST:  ISOVUE-300 IOPAMIDOL (ISOVUE-300) INJECTION 61% COMPARISON:  None. FINDINGS: Lower chest: Limited visualization of the lower thorax demonstrates minimal dependent subpleural ground-glass atelectasis, right greater than left. No discrete focal airspace opacities. No pleural effusion. Normal heart size.  No pericardial effusion. Hepatobiliary: Normal hepatic contour. No discrete hepatic lesions. The gallbladder is distended with gallbladder wall thickening and minimal amount of associated pericholecystic fluid. These findings are associated with mild centralized intrahepatic biliary ductal dilatation and suspected periportal edema. No definite radiopaque gallstones. No ascites. Pancreas: Normal appearance of the pancreas. No pancreatic duct dilatation. No peripancreatic stranding. Spleen: Normal appearance of the spleen. No parent splenic stranding. Adrenals/Urinary Tract: There is symmetric enhancement and excretion of the bilateral kidneys. No definite renal stones this postcontrast examination. No discrete renal lesions. No urinary obstruction or perinephric stranding. Normal appearance of the bilateral adrenal glands. Normal appearance of the urinary bladder given degree distention. Stomach/Bowel: Sequela of prior gastric bypass surgery. No evidence of enteric obstruction Vascular/Lymphatic: Moderate amount of mixed calcified and noncalcified atherosclerotic plaque within a normal caliber abdominal aorta. The major branch vessels of the abdominal aorta appear patent on this non CTA examination. No bulky  retroperitoneal, mesenteric, pelvic or inguinal lymphadenopathy on this noncontrast examination. Reproductive: Post hysterectomy. No discrete adnexal lesion. No free fluid in the pelvic cul-de-sac. Other: Regional soft tissues appear normal. Musculoskeletal: Post right total hip replacement. Note is made of an approximately 6.9 x 6.1 x 5.6 cm fluid collection about the anterior aspect of the right total hip prosthesis extending all the right iliacus musculature (representative axial image 86 and 72, series 21, coronal image 51, series 203). No discrete areas of osteolysis. IMPRESSION: 1. Findings worrisome for acute cholecystitis. Further evaluation nuclear medicine HIDA scan could be performed as clinically indicated. 2. Sequela of prior gastric bypass surgery without evidence of enteric obstruction. 3. Post right total hip replacement with a indeterminate fluid collection about the anterior aspect of the right total hip replacement extending to involve the right iliacus musculature. Clinical correlation is advised. 4. Sequela of prior gastric bypass surgery without evidence of enteric obstruction. Electronically Signed   By: Simonne Come M.D.   On: 10/15/2015 22:53  US Abdomen Limited Ruq  Result Date: 10/15/2015 CLINICAL DATA:  65 year old female with generalized abdominal pain and fever. EXAM: US ABDOMEN LIMITED - RIGHT UPPER QUADRANT COMPARISON:  None. FINDINGS: Gallbladder: There is sludge within the gallbladder. No significant gallbladder wall thickening or pericholecystic fluid. There is slight indistinctness of the gallbladder wall which may be technical. Mild edema is not excluded. Negative sonographic Murphy's sign. Common bile duct: Diameter: 6 mm Liver: There is mild diffuse increased hepatic echogenicity likely fatty infiltration. IMPRESSION: Gallbladder sludge. No definite sonographic evidence of acute cholecystitis. A hepatobiliary scintigraphy may provide better evaluation of the gallbladder if  an acute cholecystitis is clinically suspected. Fatty liver. Electronically Signed   By: Elgie Collard M.D.   On: 10/15/2015 21:01   Procedures Procedures (including critical care time)  Medications Ordered in ED Medications  methocarbamol (ROBAXIN) tablet 500 mg (not administered)  acetaminophen (TYLENOL) tablet 500-1,000 mg (not administered)  rosuvastatin (CRESTOR) tablet 10 mg (  not administered)  DULoxetine (CYMBALTA) DR capsule 60 mg (not administered)  HYDROcodone-acetaminophen (NORCO/VICODIN) 5-325 MG per tablet 1-2 tablet (not administered)  estradiol (ESTRACE) tablet 0.5 mg (not administered)  montelukast (SINGULAIR) tablet 10 mg (not administered)  levothyroxine (SYNTHROID, LEVOTHROID) tablet 88 mcg (not administered)  heparin injection 5,000 Units (not administered)  sodium chloride flush (NS) 0.9 % injection 3 mL (not administered)  0.9 %  sodium chloride infusion (not administered)  polyethylene glycol (MIRALAX / GLYCOLAX) packet 17 g (not administered)  bisacodyl (DULCOLAX) suppository 10 mg (not administered)  ondansetron (ZOFRAN) tablet 4 mg (not administered)    Or  ondansetron (ZOFRAN) injection 4 mg (not administered)  insulin aspart (novoLOG) injection 0-9 Units (not administered)  piperacillin-tazobactam (ZOSYN) IVPB 3.375 g (not administered)  pantoprazole (PROTONIX) injection 40 mg (not administered)  fentaNYL (SUBLIMAZE) injection 25 mcg (not administered)  sodium chloride 0.9 % bolus 1,000 mL (0 mLs Intravenous Stopped 10/15/15 2106)  sodium chloride 0.9 % bolus 1,000 mL (0 mLs Intravenous Stopped 10/15/15 2144)  piperacillin-tazobactam (ZOSYN) IVPB 3.375 g (0 g Intravenous Stopped 10/15/15 2131)  ondansetron (ZOFRAN) injection 4 mg (4 mg Intravenous Given 10/15/15 2132)  acetaminophen (TYLENOL) tablet 1,000 mg (1,000 mg Oral Given 10/15/15 2157)  sodium chloride 0.9 % bolus 1,000 mL (0 mLs Intravenous Stopped 10/15/15 2305)  iopamidol (ISOVUE-300) 61 %  injection (100 mLs  Contrast Given 10/15/15 2206)  0.9 %  sodium chloride infusion ( Intravenous Transfusing/Transfer 10/16/15 0119)     Initial Impression / Assessment and Plan / ED Course  I have reviewed the triage vital signs and the nursing notes.  Pertinent labs & imaging results that were available during my care of the patient were reviewed by me and considered in my medical decision making (see chart for details).  Clinical Course   Patient presents with abdominal pain, fever to 105, tachycardia and skin mottling concerning for sepsis. Code sepsis was initiated and she was started on IV Zosyn. She was given 3 L normal saline and reported significant improvement in her generalized weakness and malaise after fluid resuscitation. Skin mottling resolved on reexamination following fluid resuscitation. Right upper quadrant ultrasound was obtained showing gallbladder sludge but no evidence of acute cholecystitis. CT of the abdomen and pelvis showed symptoms concerning for cholecystitis, however the severity of her fever, elevated bilirubin and elevated LFTs are concerning for cholangitis. Surgery was consult to and has recommended a medicine admission for further IV antibiotics. She is admitted to the medicine service in fair condition.  Final Clinical Impressions(s) / ED Diagnoses   Final diagnoses:  Cholangitis  Non-intractable vomiting with nausea, vomiting of unspecified type    New Prescriptions Current Discharge Medication List       Levora Angel, MD 10/16/15 1610    Blane Ohara, MD 10/23/15 [phone]

## 2015-10-15 NOTE — ED Notes (Signed)
Patient transported to Ultrasound 

## 2015-10-15 NOTE — ED Notes (Signed)
Pt returned from US in no apparent distress

## 2015-10-15 NOTE — ED Notes (Signed)
Dr. Jodi Mourning notified of pt's temperature, HR after 2nd liter infused. MD at bedside.

## 2015-10-15 NOTE — ED Notes (Signed)
Pt returned from CT in no apparent distress  

## 2015-10-15 NOTE — ED Notes (Signed)
Paged [facility] to activate Code Sepsis @ 19:58

## 2015-10-15 NOTE — ED Notes (Signed)
Pt was assisted into with pt husbands help. Pt completely undressed, placed in a gown with yellow socks on. Rad came to room to get pt for xray.

## 2015-10-15 NOTE — ED Notes (Signed)
Pt brought back from xray. 

## 2015-10-15 NOTE — ED Notes (Signed)
Patient transported to CT 

## 2015-10-15 NOTE — ED Triage Notes (Signed)
Patient here with general malaise, vomiting Thursday, generalized pain and fever the past 2 days. On arrival patient pale and states she has had a rash to body x 1 day, appears there is mottling to arms, alert and oriented on arrival.

## 2015-10-15 NOTE — ED Notes (Addendum)
Patient took tylenol 1300mg  at 1500.

## 2015-10-16 ENCOUNTER — Inpatient Hospital Stay (HOSPITAL_COMMUNITY): Payer: Managed Care, Other (non HMO)

## 2015-10-16 ENCOUNTER — Encounter (HOSPITAL_COMMUNITY): Payer: Self-pay | Admitting: Family Medicine

## 2015-10-16 DIAGNOSIS — K830 Cholangitis: Secondary | ICD-10-CM

## 2015-10-16 DIAGNOSIS — Z79899 Other long term (current) drug therapy: Secondary | ICD-10-CM | POA: Diagnosis not present

## 2015-10-16 DIAGNOSIS — A419 Sepsis, unspecified organism: Secondary | ICD-10-CM | POA: Diagnosis present

## 2015-10-16 DIAGNOSIS — Z833 Family history of diabetes mellitus: Secondary | ICD-10-CM | POA: Diagnosis not present

## 2015-10-16 DIAGNOSIS — K8020 Calculus of gallbladder without cholecystitis without obstruction: Secondary | ICD-10-CM | POA: Diagnosis not present

## 2015-10-16 DIAGNOSIS — E871 Hypo-osmolality and hyponatremia: Secondary | ICD-10-CM | POA: Diagnosis present

## 2015-10-16 DIAGNOSIS — E860 Dehydration: Secondary | ICD-10-CM | POA: Diagnosis present

## 2015-10-16 DIAGNOSIS — Z419 Encounter for procedure for purposes other than remedying health state, unspecified: Secondary | ICD-10-CM | POA: Diagnosis not present

## 2015-10-16 DIAGNOSIS — K5660 Unspecified intestinal obstruction: Secondary | ICD-10-CM | POA: Diagnosis not present

## 2015-10-16 DIAGNOSIS — R739 Hyperglycemia, unspecified: Secondary | ICD-10-CM | POA: Diagnosis present

## 2015-10-16 DIAGNOSIS — K219 Gastro-esophageal reflux disease without esophagitis: Secondary | ICD-10-CM | POA: Diagnosis present

## 2015-10-16 DIAGNOSIS — Z7982 Long term (current) use of aspirin: Secondary | ICD-10-CM | POA: Diagnosis not present

## 2015-10-16 DIAGNOSIS — R140 Abdominal distension (gaseous): Secondary | ICD-10-CM | POA: Diagnosis not present

## 2015-10-16 DIAGNOSIS — Y838 Other surgical procedures as the cause of abnormal reaction of the patient, or of later complication, without mention of misadventure at the time of the procedure: Secondary | ICD-10-CM | POA: Diagnosis not present

## 2015-10-16 DIAGNOSIS — Z9889 Other specified postprocedural states: Secondary | ICD-10-CM | POA: Diagnosis not present

## 2015-10-16 DIAGNOSIS — K812 Acute cholecystitis with chronic cholecystitis: Secondary | ICD-10-CM | POA: Diagnosis not present

## 2015-10-16 DIAGNOSIS — M797 Fibromyalgia: Secondary | ICD-10-CM | POA: Diagnosis present

## 2015-10-16 DIAGNOSIS — E039 Hypothyroidism, unspecified: Secondary | ICD-10-CM

## 2015-10-16 DIAGNOSIS — K8309 Other cholangitis: Secondary | ICD-10-CM | POA: Diagnosis present

## 2015-10-16 DIAGNOSIS — M1990 Unspecified osteoarthritis, unspecified site: Secondary | ICD-10-CM | POA: Diagnosis present

## 2015-10-16 DIAGNOSIS — K828 Other specified diseases of gallbladder: Secondary | ICD-10-CM | POA: Diagnosis present

## 2015-10-16 DIAGNOSIS — K567 Ileus, unspecified: Secondary | ICD-10-CM | POA: Diagnosis not present

## 2015-10-16 DIAGNOSIS — R7989 Other specified abnormal findings of blood chemistry: Secondary | ICD-10-CM | POA: Diagnosis not present

## 2015-10-16 DIAGNOSIS — E876 Hypokalemia: Secondary | ICD-10-CM | POA: Diagnosis not present

## 2015-10-16 DIAGNOSIS — Z96641 Presence of right artificial hip joint: Secondary | ICD-10-CM | POA: Diagnosis present

## 2015-10-16 DIAGNOSIS — K8000 Calculus of gallbladder with acute cholecystitis without obstruction: Secondary | ICD-10-CM | POA: Diagnosis present

## 2015-10-16 DIAGNOSIS — Z9884 Bariatric surgery status: Secondary | ICD-10-CM | POA: Diagnosis not present

## 2015-10-16 DIAGNOSIS — Z8249 Family history of ischemic heart disease and other diseases of the circulatory system: Secondary | ICD-10-CM | POA: Diagnosis not present

## 2015-10-16 DIAGNOSIS — Z87891 Personal history of nicotine dependence: Secondary | ICD-10-CM | POA: Diagnosis not present

## 2015-10-16 DIAGNOSIS — E785 Hyperlipidemia, unspecified: Secondary | ICD-10-CM | POA: Diagnosis present

## 2015-10-16 DIAGNOSIS — Z79891 Long term (current) use of opiate analgesic: Secondary | ICD-10-CM | POA: Diagnosis not present

## 2015-10-16 DIAGNOSIS — K560 Paralytic ileus: Secondary | ICD-10-CM | POA: Diagnosis not present

## 2015-10-16 DIAGNOSIS — Y92239 Unspecified place in hospital as the place of occurrence of the external cause: Secondary | ICD-10-CM | POA: Diagnosis not present

## 2015-10-16 DIAGNOSIS — Z6841 Body Mass Index (BMI) 40.0 and over, adult: Secondary | ICD-10-CM | POA: Diagnosis not present

## 2015-10-16 DIAGNOSIS — G8929 Other chronic pain: Secondary | ICD-10-CM | POA: Diagnosis present

## 2015-10-16 DIAGNOSIS — K913 Postprocedural intestinal obstruction: Secondary | ICD-10-CM | POA: Diagnosis not present

## 2015-10-16 LAB — GLUCOSE, CAPILLARY
GLUCOSE-CAPILLARY: 102 mg/dL — AB (ref 65–99)
GLUCOSE-CAPILLARY: 72 mg/dL (ref 65–99)
GLUCOSE-CAPILLARY: 130 mg/dL — AB (ref 65–99)
Glucose-Capillary: 147 mg/dL — ABNORMAL HIGH (ref 65–99)
Glucose-Capillary: 99 mg/dL (ref 65–99)
Glucose-Capillary: 105 mg/dL — ABNORMAL HIGH (ref 65–99)

## 2015-10-16 LAB — CBC WITH DIFFERENTIAL/PLATELET
BASOS ABS: 0 10*3/uL (ref 0.0–0.1)
BASOS PCT: 0 %
EOS PCT: 0 %
Eosinophils Absolute: 0 10*3/uL (ref 0.0–0.7)
HCT: 41.2 % (ref 36.0–46.0)
Hemoglobin: 12.8 g/dL (ref 12.0–15.0)
Lymphocytes Relative: 9 %
Lymphs Abs: 0.9 10*3/uL (ref 0.7–4.0)
MCH: 29 pg (ref 26.0–34.0)
MCHC: 31.1 g/dL (ref 30.0–36.0)
MCV: 93.2 fL (ref 78.0–100.0)
MONO ABS: 0.6 10*3/uL (ref 0.1–1.0)
Monocytes Relative: 6 %
Neutro Abs: 8.5 10*3/uL — ABNORMAL HIGH (ref 1.7–7.7)
Neutrophils Relative %: 85 %
PLATELETS: 188 10*3/uL (ref 150–400)
RBC: 4.42 MIL/uL (ref 3.87–5.11)
RDW: 13.6 % (ref 11.5–15.5)
WBC: 10 10*3/uL (ref 4.0–10.5)

## 2015-10-16 LAB — COMPREHENSIVE METABOLIC PANEL
ALBUMIN: 2.7 g/dL — AB (ref 3.5–5.0)
ALT: 85 U/L — ABNORMAL HIGH (ref 14–54)
AST: 107 U/L — AB (ref 15–41)
Alkaline Phosphatase: 274 U/L — ABNORMAL HIGH (ref 38–126)
Anion gap: 7 (ref 5–15)
CHLORIDE: 107 mmol/L (ref 101–111)
CO2: 24 mmol/L (ref 22–32)
Calcium: 7.9 mg/dL — ABNORMAL LOW (ref 8.9–10.3)
Creatinine, Ser: 0.89 mg/dL (ref 0.44–1.00)
GFR calc Af Amer: >60 mL/min (ref >60)
Glucose, Bld: 130 mg/dL — ABNORMAL HIGH (ref 65–99)
POTASSIUM: 3.4 mmol/L — AB (ref 3.5–5.1)
Sodium: 138 mmol/L (ref 135–145)
Total Bilirubin: 2.6 mg/dL — ABNORMAL HIGH (ref 0.3–1.2)
Total Protein: 6 g/dL — ABNORMAL LOW (ref 6.5–8.1)

## 2015-10-16 LAB — PROTIME-INR
INR: 1.3 (ref 0.00–1.49)
PROTHROMBIN TIME: 16.3 s — AB (ref 11.6–15.2)

## 2015-10-16 LAB — SURGICAL PCR SCREEN
MRSA, PCR: NEGATIVE
Staphylococcus aureus: NEGATIVE

## 2015-10-16 LAB — PROCALCITONIN: PROCALCITONIN: 6.32 ng/mL

## 2015-10-16 LAB — APTT: aPTT: 32 seconds (ref 24–37)

## 2015-10-16 LAB — TYPE AND SCREEN
ABO/RH(D): O POS
Antibody Screen: NEGATIVE

## 2015-10-16 MED ORDER — ACETAMINOPHEN 500 MG PO TABS
500.0000 mg | ORAL_TABLET | Freq: Four times a day (QID) | ORAL | Status: DC | PRN
  Administered 2015-10-17: 500 mg via ORAL
  Filled 2015-10-16: qty 1

## 2015-10-16 MED ORDER — LEVOTHYROXINE SODIUM 88 MCG PO TABS
88.0000 ug | ORAL_TABLET | Freq: Every day | ORAL | Status: DC
Start: 2015-10-16 — End: 2015-10-24
  Administered 2015-10-18 – 2015-10-24 (×6): 88 ug via ORAL
  Filled 2015-10-16 (×7): qty 1

## 2015-10-16 MED ORDER — MONTELUKAST SODIUM 10 MG PO TABS
10.0000 mg | ORAL_TABLET | Freq: Every day | ORAL | Status: DC
  Administered 2015-10-16 – 2015-10-23 (×7): 10 mg via ORAL
  Filled 2015-10-16 (×8): qty 1

## 2015-10-16 MED ORDER — HEPARIN SODIUM (PORCINE) 5000 UNIT/ML IJ SOLN
5000.0000 [IU] | Freq: Three times a day (TID) | INTRAMUSCULAR | Status: DC
  Administered 2015-10-16 – 2015-10-24 (×23): 5000 [IU] via SUBCUTANEOUS
  Filled 2015-10-16 (×22): qty 1

## 2015-10-16 MED ORDER — ESTRADIOL 1 MG PO TABS
0.5000 mg | ORAL_TABLET | Freq: Every day | ORAL | Status: DC
  Administered 2015-10-17 – 2015-10-24 (×7): 0.5 mg via ORAL
  Filled 2015-10-16 (×5): qty 1
  Filled 2015-10-16: qty 0.5
  Filled 2015-10-16 (×2): qty 1
  Filled 2015-10-16: qty 0.5

## 2015-10-16 MED ORDER — INSULIN ASPART 100 UNIT/ML ~~LOC~~ SOLN
0.0000 [IU] | SUBCUTANEOUS | Status: DC
  Administered 2015-10-16 – 2015-10-18 (×4): 1 [IU] via SUBCUTANEOUS
  Administered 2015-10-18: 2 [IU] via SUBCUTANEOUS
  Administered 2015-10-19 – 2015-10-20 (×5): 1 [IU] via SUBCUTANEOUS

## 2015-10-16 MED ORDER — METHOCARBAMOL 500 MG PO TABS
500.0000 mg | ORAL_TABLET | Freq: Three times a day (TID) | ORAL | Status: DC | PRN
  Administered 2015-10-17 – 2015-10-20 (×3): 500 mg via ORAL
  Filled 2015-10-16 (×5): qty 1

## 2015-10-16 MED ORDER — DULOXETINE HCL 60 MG PO CPEP
60.0000 mg | ORAL_CAPSULE | Freq: Every day | ORAL | Status: DC
  Administered 2015-10-18 – 2015-10-24 (×6): 60 mg via ORAL
  Filled 2015-10-16 (×6): qty 1

## 2015-10-16 MED ORDER — SODIUM CHLORIDE 0.9 % IV BOLUS (SEPSIS): 1500.0000 mL | Freq: Once | INTRAVENOUS | Status: DC

## 2015-10-16 MED ORDER — DEXTROSE-NACL 5-0.9 % IV SOLN
INTRAVENOUS | Status: DC
  Administered 2015-10-16 – 2015-10-17 (×2): via INTRAVENOUS
  Administered 2015-10-17: 1000 mL via INTRAVENOUS

## 2015-10-16 MED ORDER — BISACODYL 10 MG RE SUPP: 10.0000 mg | Freq: Every day | RECTAL | Status: DC | PRN

## 2015-10-16 MED ORDER — PIPERACILLIN-TAZOBACTAM 3.375 G IVPB
3.3750 g | Freq: Three times a day (TID) | INTRAVENOUS | Status: DC
  Administered 2015-10-16 – 2015-10-24 (×26): 3.375 g via INTRAVENOUS
  Filled 2015-10-16 (×29): qty 50

## 2015-10-16 MED ORDER — SODIUM CHLORIDE 0.9% FLUSH
3.0000 mL | Freq: Two times a day (BID) | INTRAVENOUS | Status: DC
  Administered 2015-10-16 – 2015-10-24 (×8): 3 mL via INTRAVENOUS

## 2015-10-16 MED ORDER — HYDROCODONE-ACETAMINOPHEN 5-325 MG PO TABS
1.0000 | ORAL_TABLET | ORAL | Status: DC | PRN
Start: 2015-10-16 — End: 2015-10-18
  Administered 2015-10-17 (×3): 2 via ORAL
  Administered 2015-10-17: 1 via ORAL
  Administered 2015-10-18 (×2): 2 via ORAL
  Filled 2015-10-16 (×3): qty 2
  Filled 2015-10-16: qty 1
  Filled 2015-10-16 (×2): qty 2

## 2015-10-16 MED ORDER — ONDANSETRON HCL 4 MG PO TABS
4.0000 mg | ORAL_TABLET | Freq: Four times a day (QID) | ORAL | Status: DC | PRN
  Administered 2015-10-19 – 2015-10-20 (×2): 4 mg via ORAL
  Filled 2015-10-16 (×2): qty 1

## 2015-10-16 MED ORDER — FENTANYL CITRATE (PF) 100 MCG/2ML IJ SOLN
25.0000 ug | INTRAMUSCULAR | Status: DC | PRN
  Administered 2015-10-16 – 2015-10-17 (×7): 25 ug via INTRAVENOUS
  Filled 2015-10-16 (×8): qty 2

## 2015-10-16 MED ORDER — ROSUVASTATIN CALCIUM 10 MG PO TABS
10.0000 mg | ORAL_TABLET | Freq: Every evening | ORAL | Status: DC
  Administered 2015-10-17 – 2015-10-23 (×5): 10 mg via ORAL
  Filled 2015-10-16 (×8): qty 1

## 2015-10-16 MED ORDER — PANTOPRAZOLE SODIUM 40 MG PO TBEC: 40.0000 mg | DELAYED_RELEASE_TABLET | Freq: Two times a day (BID) | ORAL | Status: DC

## 2015-10-16 MED ORDER — DEXTROSE 50 % IV SOLN: 25.0000 mL | INTRAVENOUS | Status: DC | PRN

## 2015-10-16 MED ORDER — POLYETHYLENE GLYCOL 3350 17 G PO PACK: 17.0000 g | PACK | Freq: Every day | ORAL | Status: DC | PRN

## 2015-10-16 MED ORDER — ONDANSETRON HCL 4 MG/2ML IJ SOLN
4.0000 mg | Freq: Four times a day (QID) | INTRAMUSCULAR | Status: DC | PRN
  Administered 2015-10-16 – 2015-10-24 (×9): 4 mg via INTRAVENOUS
  Filled 2015-10-16 (×9): qty 2

## 2015-10-16 MED ORDER — POTASSIUM CHLORIDE IN NACL 40-0.9 MEQ/L-% IV SOLN
INTRAVENOUS | Status: DC
  Administered 2015-10-16: 125 mL/h via INTRAVENOUS
  Filled 2015-10-16: qty 1000

## 2015-10-16 MED ORDER — PANTOPRAZOLE SODIUM 40 MG IV SOLR
40.0000 mg | Freq: Two times a day (BID) | INTRAVENOUS | Status: DC
  Administered 2015-10-16 – 2015-10-17 (×4): 40 mg via INTRAVENOUS
  Filled 2015-10-16 (×4): qty 40

## 2015-10-16 MED ORDER — SODIUM CHLORIDE 0.9 % IV SOLN
INTRAVENOUS | Status: AC
  Administered 2015-10-16: 03:00:00 via INTRAVENOUS

## 2015-10-16 NOTE — Consult Note (Signed)
Reason for Consult: Cholecystitis possible cholangitis Referring Physician: Varney Biles  Haley Larson is an 65 y.o. female.  HPI: Haley Larson developed epigastric pain, nausea and vomiting early Thursday morning. Persisted for several hours that day. She continued to have discomfort and intermittent nausea and vomiting, especially after eating or drinking anything. She developed subjective fevers at home. She came to the emergency department and further evaluation revealed fever of 102.5, liver function tests are mildly elevated, and ultrasound and CT scan suggest cholecystitis. Of note, she is status post open gastric bypass surgery at [facility] in the 1970s.  Past Medical History:  Diagnosis Date  . Arthritis   . Chest pain   . Dyslipidemia   . Family history of early CAD    STRONG  . Fibromyalgia   . GERD (gastroesophageal reflux disease)   . H/O: hysterectomy   . Hypothyroidism   . Morbid obesity ([place])   . PONV (postoperative nausea and vomiting)   . Postmenopausal   . Seasonal allergies     Past Surgical History:  Procedure Laterality Date  . ABDOMINAL HYSTERECTOMY    . COLONOSCOPY W/ POLYPECTOMY    . GASTRIC BYPASS  1977  . ROTATOR CUFF REPAIR  04/2007  . ROTATOR CUFF REPAIR  08/2007  . TONSILLECTOMY    . TOTAL HIP ARTHROPLASTY Right 07/19/2015  . TOTAL HIP ARTHROPLASTY Right 07/19/2015   Procedure: TOTAL HIP ARTHROPLASTY ANTERIOR APPROACH;  Surgeon: Ninetta Lights, MD;  Location: [facility];  Service: Orthopedics;  Laterality: Right;    Family History  Problem Relation Age of Onset  . Other Father     infection   . Coronary artery disease Mother     with stents  . Atrial fibrillation Mother     with pacemaker  . Heart failure Mother   . Diabetes Mother   . Heart attack Brother   . Heart attack Brother 2    massive  MI  . Heart attack Brother 54    x3  . Heart attack Brother 15    with stent  . Coronary artery disease Brother     with stent  . Alcohol abuse  Brother   . Coronary artery disease Brother     with stents    Social History:  reports that she quit smoking about 9 years ago. Her smoking use included Cigarettes. She has a 30.00 pack-year smoking history. She has never used smokeless tobacco. She reports that she does not drink alcohol or use drugs.  Allergies: No Known Allergies  Medications: not a hospital admit Results for orders placed or performed during the hospital encounter of 10/15/15 (from the past 48 hour(s))  Comprehensive metabolic panel     Status: Abnormal   Collection Time: 10/15/15  6:32 PM  Result Value Ref Range   Sodium 133 (L) 135 - 145 mmol/L   Potassium 3.6 3.5 - 5.1 mmol/L   Chloride 99 (L) 101 - 111 mmol/L   CO2 24 22 - 32 mmol/L   Glucose, Bld 235 (H) 65 - 99 mg/dL   BUN 9 6 - 20 mg/dL   Creatinine, Ser 0.88 0.44 - 1.00 mg/dL   Calcium 8.8 (L) 8.9 - 10.3 mg/dL   Total Protein 7.2 6.5 - 8.1 g/dL   Albumin 3.4 (L) 3.5 - 5.0 g/dL   AST 147 (H) 15 - 41 U/L   ALT 106 (H) 14 - 54 U/L   Alkaline Phosphatase 286 (H) 38 - 126 U/L   Total Bilirubin  1.6 (H) 0.3 - 1.2 mg/dL   GFR calc non Af Amer >60 >60 mL/min   GFR calc Af Amer >60 >60 mL/min    Comment: (NOTE) The eGFR has been calculated using the CKD EPI equation. This calculation has not been validated in all clinical situations. eGFR's persistently <60 mL/min signify possible Chronic Kidney Disease.    Anion gap 10 5 - 15  CBC with Differential     Status: Abnormal   Collection Time: 10/15/15  6:32 PM  Result Value Ref Range   WBC 13.2 (H) 4.0 - 10.5 K/uL   RBC 4.70 3.87 - 5.11 MIL/uL   Hemoglobin 14.2 12.0 - 15.0 g/dL   HCT 43.6 36.0 - 46.0 %   MCV 92.8 78.0 - 100.0 fL   MCH 30.2 26.0 - 34.0 pg   MCHC 32.6 30.0 - 36.0 g/dL   RDW 13.4 11.5 - 15.5 %   Platelets 210 150 - 400 K/uL   Neutrophils Relative % 88 %   Neutro Abs 11.7 (H) 1.7 - 7.7 K/uL   Lymphocytes Relative 5 %   Lymphs Abs 0.6 (L) 0.7 - 4.0 K/uL   Monocytes Relative 7 %    Monocytes Absolute 0.9 0.1 - 1.0 K/uL   Eosinophils Relative 0 %   Eosinophils Absolute 0.0 0.0 - 0.7 K/uL   Basophils Relative 0 %   Basophils Absolute 0.0 0.0 - 0.1 K/uL  Lipase, blood     Status: None   Collection Time: 10/15/15  6:36 PM  Result Value Ref Range   Lipase 18 11 - 51 U/L  I-Stat CG4 Lactic Acid, ED     Status: Abnormal   Collection Time: 10/15/15  6:39 PM  Result Value Ref Range   Lactic Acid, Venous 2.89 (HH) 0.5 - 1.9 mmol/L   Comment NOTIFIED PHYSICIAN   Urinalysis, Routine w reflex microscopic     Status: Abnormal   Collection Time: 10/15/15  9:49 PM  Result Value Ref Range   Color, Urine YELLOW YELLOW   APPearance CLEAR CLEAR   Specific Gravity, Urine 1.023 1.005 - 1.030   pH 7.0 5.0 - 8.0   Glucose, UA 100 (A) NEGATIVE mg/dL   Hgb urine dipstick TRACE (A) NEGATIVE   Bilirubin Urine MODERATE (A) NEGATIVE   Ketones, ur NEGATIVE NEGATIVE mg/dL   Protein, ur 100 (A) NEGATIVE mg/dL   Nitrite NEGATIVE NEGATIVE   Leukocytes, UA NEGATIVE NEGATIVE  Urine microscopic-add on     Status: Abnormal   Collection Time: 10/15/15  9:49 PM  Result Value Ref Range   Squamous Epithelial / LPF 0-5 (A) NONE SEEN   WBC, UA 0-5 0 - 5 WBC/hpf   RBC / HPF 0-5 0 - 5 RBC/hpf   Bacteria, UA RARE (A) NONE SEEN  I-Stat CG4 Lactic Acid, ED     Status: None   Collection Time: 10/15/15 11:38 PM  Result Value Ref Range   Lactic Acid, Venous 1.85 0.5 - 1.9 mmol/L    Dg Chest 2 View  Result Date: 10/15/2015 CLINICAL DATA:  Fever, shortness of breath and chest pain for 1 day. EXAM: CHEST  2 VIEW COMPARISON:  CT chest and PA and lateral chest 01/30/2010. FINDINGS: The lungs are clear. Heart size is normal. No pneumothorax or pleural effusion. No focal bony abnormality. Surgical clips left upper quadrant of the abdomen are noted. IMPRESSION: No acute disease. Electronically Signed   By: Inge Rise M.D.   On: 10/15/2015 19:09  Ct Abdomen Pelvis W  Contrast  Result Date:  10/15/2015 CLINICAL DATA:  Generalized abdominal pain and nausea. EXAM: CT ABDOMEN AND PELVIS WITH CONTRAST TECHNIQUE: Multidetector CT imaging of the abdomen and pelvis was performed using the standard protocol following bolus administration of intravenous contrast. CONTRAST:  131m ISOVUE-300 IOPAMIDOL (ISOVUE-300) INJECTION 61% COMPARISON:  None. FINDINGS: Lower chest: Limited visualization of the lower thorax demonstrates minimal dependent subpleural ground-glass atelectasis, right greater than left. No discrete focal airspace opacities. No pleural effusion. Normal heart size.  No pericardial effusion. Hepatobiliary: Normal hepatic contour. No discrete hepatic lesions. The gallbladder is distended with gallbladder wall thickening and minimal amount of associated pericholecystic fluid. These findings are associated with mild centralized intrahepatic biliary ductal dilatation and suspected periportal edema. No definite radiopaque gallstones. No ascites. Pancreas: Normal appearance of the pancreas. No pancreatic duct dilatation. No peripancreatic stranding. Spleen: Normal appearance of the spleen. No parent splenic stranding. Adrenals/Urinary Tract: There is symmetric enhancement and excretion of the bilateral kidneys. No definite renal stones this postcontrast examination. No discrete renal lesions. No urinary obstruction or perinephric stranding. Normal appearance of the bilateral adrenal glands. Normal appearance of the urinary bladder given degree distention. Stomach/Bowel: Sequela of prior gastric bypass surgery. No evidence of enteric obstruction Vascular/Lymphatic: Moderate amount of mixed calcified and noncalcified atherosclerotic plaque within a normal caliber abdominal aorta. The major branch vessels of the abdominal aorta appear patent on this non CTA examination. No bulky retroperitoneal, mesenteric, pelvic or inguinal lymphadenopathy on this noncontrast examination. Reproductive: Post hysterectomy. No  discrete adnexal lesion. No free fluid in the pelvic cul-de-sac. Other: Regional soft tissues appear normal. Musculoskeletal: Post right total hip replacement. Note is made of an approximately 6.9 x 6.1 x 5.6 cm fluid collection about the anterior aspect of the right total hip prosthesis extending all the right iliacus musculature (representative axial image 86 and 72, series 21, coronal image 51, series 203). No discrete areas of osteolysis. IMPRESSION: 1. Findings worrisome for acute cholecystitis. Further evaluation nuclear medicine HIDA scan could be performed as clinically indicated. 2. Sequela of prior gastric bypass surgery without evidence of enteric obstruction. 3. Post right total hip replacement with a indeterminate fluid collection about the anterior aspect of the right total hip replacement extending to involve the right iliacus musculature. Clinical correlation is advised. 4. Sequela of prior gastric bypass surgery without evidence of enteric obstruction. Electronically Signed   By: JSandi MariscalM.D.   On: 10/15/2015 22:53  [place]dom[place] Limited Ruq  Result Date: 10/15/2015 CLINICAL DATA:  65year old female with generalized abdominal pain and fever. EXAM: [place] - RIGHT UPPER QUADRANT COMPARISON:  None. FINDINGS: Gallbladder: There is sludge within the gallbladder. No significant gallbladder wall thickening or pericholecystic fluid. There is slight indistinctness of the gallbladder wall which may be technical. Mild edema is not excluded. Negative sonographic Murphy's sign. Common bile duct: Diameter: 6 mm Liver: There is mild diffuse increased hepatic echogenicity likely fatty infiltration. IMPRESSION: Gallbladder sludge. No definite sonographic evidence of acute cholecystitis. A hepatobiliary scintigraphy may provide better evaluation of the gallbladder if an acute cholecystitis is clinically suspected. Fatty liver. Electronically Signed   By: AAnner CreteM.D.   On: 10/15/2015  21:01   Review of Systems  Constitutional: Positive for fever and malaise/fatigue.  HENT: Negative.   Eyes: Negative for blurred vision.  Respiratory: Negative for cough and shortness of breath.   Cardiovascular: Negative for chest pain.  Gastrointestinal: Positive for abdominal pain, nausea and vomiting.  Genitourinary: Negative.   Musculoskeletal:  Right hip replacement in April of this year  Skin: Negative.   Neurological: Negative.   Endo/Heme/Allergies: Negative.   Psychiatric/Behavioral: Negative.    Blood pressure 110/60, pulse 113, temperature 101.5 F (38.6 C), temperature source Oral, resp. rate 19, height '5\' 4"'  (1.626 m), weight 104.3 kg (230 lb), SpO2 93 %. Physical Exam  Constitutional: She is oriented to person, place, and time. She appears well-developed and well-nourished. No distress.  HENT:  Head: Normocephalic.  Right Ear: External ear normal.  Left Ear: External ear normal.  Nose: Nose normal.  Mouth/Throat: Oropharynx is clear and moist.  Eyes: EOM are normal. Pupils are equal, round, and reactive to light.  Neck: Neck supple. No tracheal deviation present.  Cardiovascular: Normal rate, normal heart sounds and intact distal pulses.   Respiratory: Breath sounds normal. No respiratory distress. She has no wheezes. She has no rales.  GI: Soft. She exhibits no distension. There is tenderness. There is no rebound and no guarding.  Tenderness in the epigastrium and right upper quadrant, no peritonitis  Musculoskeletal: Normal range of motion.  Scar from right hip replacement  Neurological: She is alert and oriented to person, place, and time. She exhibits normal muscle tone.  Skin: Skin is warm.  Psychiatric: She has a normal mood and affect.    Assessment/Plan: Cholecystitis, possible cholangitis - agree with medical admission, IV fluids, IV antibiotics, bowel rest. Keep NPO. Repeat liver function tests in a.m. Possible laparoscopic  cholecystectomy/cholangiogram in a.m. if liver function tests improve  Marlo Goodrich E 10/16/2015, 12:33 AM

## 2015-10-16 NOTE — Progress Notes (Addendum)
Patient seen and examined   65 y.o. female with medical history significant for morbid obesity status post open gastric bypass in the 1970s, arthritis with chronic pain, hypothyroidism, and hyperlipidemia who presents the emergency department for evaluation of 4 days of worsening abdominal pain, nausea, and vomiting with development of fever. Now has Acute cholecystitis - possible common bile duct obstruction with increasing bilirubin  Plan Dr Loreta Ave requested to consider ERCP [facility] Per Dr Dwain Sarna needs lap chole ,  Replete electrolytes

## 2015-10-16 NOTE — Consult Note (Signed)
Referring Provider:  Dr. Harlon Flor (Gen Surg) Primary Care Physician:  WHITE, Bonnell Public, NP Primary Gastroenterologist:  Dr. Loreta Ave (we are asked to see patient given Dr. Haywood Pao absence and the need for possible biliary intervention)  Reason for Consultation:  Elevated LFTs, and possible CBD stone  HPI: Haley Larson is a 65 y.o. female with PMH of GERD, dyslipidemia, obesity status post gastric bypass in the 1970s, fibromyalgia, hypothyroidism, and arthritis who was admitted with epigastric abdominal pain, nausea and vomiting found to have cholecystitis. She reports that pain started at home and became progressive over 4 days in the epigastrium. This pain was severe and rated 10 out of 10. Associated with nausea and vomiting. She reports she has had similar painful attacks over the last several years which she thought was more likely reflux or acid peptic induced. These often happened at night and lasted for several hours. Pain now is improved but remains 5 out of 10. She has received IV pain medication. She is also had fever, 102.5 on admission and more low-grade since. She currently denies chest pain, dyspnea. She reports bowel movements at home prior to admission have been regular without blood or melena. No urinary complaints.  In the ER she was found to have elevation in serum transaminases along with bilirubin at 1.6 and alkaline phosphatase of 286. Her bilirubin has up-trended to 2.6 today. Abdominal ultrasound showed gallbladder sludge with no definite sonographic evidence of cholecystitis. This was followed by CT scan which was worrisome for acute cholecystitis. Prior gastric bypass anatomy seen. There was no evidence of definitive biliary ductal dilatation and no CBD stone seen on ultrasound or CT.   Past Medical History:  Diagnosis Date  . Arthritis   . Chest pain   . Dyslipidemia   . Family history of early CAD    STRONG  . Fibromyalgia   . GERD (gastroesophageal reflux disease)   .  H/O: hysterectomy   . Hypothyroidism   . Morbid obesity (HCC)   . PONV (postoperative nausea and vomiting)   . Postmenopausal   . Seasonal allergies     Past Surgical History:  Procedure Laterality Date  . ABDOMINAL HYSTERECTOMY    . COLONOSCOPY  06/2009   Dr Loreta Ave at outpt endoscopy center for family hax colon cancer.  Normal study to terminal ileum.   Marland Kitchen GASTRIC BYPASS  1977  . ROTATOR CUFF REPAIR  04/2007  . ROTATOR CUFF REPAIR  08/2007  . TONSILLECTOMY    . TOTAL HIP ARTHROPLASTY Right 07/19/2015   Procedure: TOTAL HIP ARTHROPLASTY ANTERIOR APPROACH;  Surgeon: Loreta Ave, MD;  Location: [facility] OR;  Service: Orthopedics;  Laterality: Right;    Prior to Admission medications   Medication Sig Start Date End Date Taking? Authorizing Provider  acetaminophen (TYLENOL) 500 MG tablet Take 500-1,000 mg by mouth every 6 (six) hours as needed for mild pain, fever or headache.   Yes Historical Provider, MD  aspirin EC 81 MG tablet Take 81 mg by mouth daily.   Yes Historical Provider, MD  estradiol (ESTRACE) 0.5 MG tablet Take 1 tablet by mouth daily. 10/27/13  Yes Historical Provider, MD  HYDROcodone-acetaminophen (NORCO) 5-325 MG tablet Take 1-2 tablets by mouth every 4 (four) hours as needed for moderate pain. Patient taking differently: Take 1 tablet by mouth 2 (two) times daily as needed for moderate pain.  07/20/15  Yes Cristie Hem, PA-C  levothyroxine (SYNTHROID, LEVOTHROID) 88 MCG tablet Take 88 mcg by mouth daily before  breakfast.  09/16/12  Yes Historical Provider, MD  methocarbamol (ROBAXIN) 500 MG tablet Take 500 mg by mouth See admin instructions. Every 8-12 hours as needed for spasms   Yes Historical Provider, MD  montelukast (SINGULAIR) 10 MG tablet Take 1 tablet by mouth at bedtime. 10/05/13  Yes Historical Provider, MD  ondansetron (ZOFRAN-ODT) 8 MG disintegrating tablet Take 8 mg by mouth See admin instructions. Every 8-12 hours as needed for nausea/vomiting   Yes Historical  Provider, MD  pantoprazole (PROTONIX) 40 MG tablet Take 40 mg by mouth 2 (two) times daily.   Yes Historical Provider, MD  pregabalin (LYRICA) 150 MG capsule Take 150 mg by mouth See admin instructions. One to two times daily   Yes Historical Provider, MD  rosuvastatin (CRESTOR) 10 MG tablet Take 10 mg by mouth every evening. 09/28/15  Yes Historical Provider, MD  DULoxetine (CYMBALTA) 60 MG capsule Take 60 mg by mouth daily.    Historical Provider, MD  ferrous sulfate 325 (65 FE) MG tablet Take 1 tablet (325 mg total) by mouth 2 (two) times daily with a meal. Patient not taking: Reported on 10/15/2015 07/22/15   Rolly Salter, MD  polyethylene glycol (MIRALAX / Ethelene Hal) packet Take 17 g by mouth 2 (two) times daily. Patient not taking: Reported on 10/15/2015 07/22/15   Rolly Salter, MD    Current Facility-Administered Medications  Medication Dose Route Frequency Provider Last Rate Last Dose  . 0.9 % NaCl with KCl 40 mEq / L  infusion   Intravenous Continuous Richarda Overlie, MD      . acetaminophen (TYLENOL) tablet 500-1,000 mg  500-1,000 mg Oral Q6H PRN Briscoe Deutscher, MD      . bisacodyl (DULCOLAX) suppository 10 mg  10 mg Rectal Daily PRN Briscoe Deutscher, MD      . DULoxetine (CYMBALTA) DR capsule 60 mg  60 mg Oral Daily Timothy S Opyd, MD      . estradiol (ESTRACE) tablet 0.5 mg  0.5 mg Oral Daily Lavone Neri Opyd, MD      . fentaNYL (SUBLIMAZE) injection 25 mcg  25 mcg Intravenous Q1H PRN Briscoe Deutscher, MD   25 mcg at 10/16/15 1201  . heparin injection 5,000 Units  5,000 Units Subcutaneous Q8H Briscoe Deutscher, MD   5,000 Units at 10/16/15 1201  . HYDROcodone-acetaminophen (NORCO/VICODIN) 5-325 MG per tablet 1-2 tablet  1-2 tablet Oral Q4H PRN Lavone Neri Opyd, MD      . insulin aspart (novoLOG) injection 0-9 Units  0-9 Units Subcutaneous Q4H Timothy S Opyd, MD      . levothyroxine (SYNTHROID, LEVOTHROID) tablet 88 mcg  88 mcg Oral QAC breakfast Lavone Neri Opyd, MD      . methocarbamol (ROBAXIN)  tablet 500 mg  500 mg Oral Q8H PRN Lavone Neri Opyd, MD      . montelukast (SINGULAIR) tablet 10 mg  10 mg Oral QHS Briscoe Deutscher, MD   10 mg at 10/16/15 0305  . ondansetron (ZOFRAN) tablet 4 mg  4 mg Oral Q6H PRN Briscoe Deutscher, MD       Or  . ondansetron (ZOFRAN) injection 4 mg  4 mg Intravenous Q6H PRN Briscoe Deutscher, MD   4 mg at 10/16/15 0415  . pantoprazole (PROTONIX) injection 40 mg  40 mg Intravenous Q12H Briscoe Deutscher, MD   40 mg at 10/16/15 0852  . piperacillin-tazobactam (ZOSYN) IVPB 3.375 g  3.375 g Intravenous Q8H Briscoe Deutscher, MD  3.375 g at 10/16/15 1201  . polyethylene glycol (MIRALAX / GLYCOLAX) packet 17 g  17 g Oral Daily PRN Briscoe Deutscher, MD      . rosuvastatin (CRESTOR) tablet 10 mg  10 mg Oral QPM Timothy S Opyd, MD      . sodium chloride flush (NS) 0.9 % injection 3 mL  3 mL Intravenous Q12H Lavone Neri Opyd, MD   3 mL at 10/16/15 1000    Allergies as of 10/15/2015  . (No Known Allergies)    Family History  Problem Relation Age of Onset  . Other Father     infection   . Coronary artery disease Mother     with stents  . Atrial fibrillation Mother     with pacemaker  . Heart failure Mother   . Diabetes Mother   . Heart attack Brother   . Heart attack Brother 38    massive  MI  . Heart attack Brother 54    x3  . Heart attack Brother 87    with stent  . Coronary artery disease Brother     with stent  . Alcohol abuse Brother   . Coronary artery disease Brother     with stents    Social History   Social History  . Marital status: Married    Spouse name: N/A  . Number of children: 1  . Years of education: N/A   Occupational History  . CNA [employer]   Social History Main Topics  . Smoking status: Former Smoker    Packs/day: 1.00    Years: 30.00    Types: Cigarettes    Quit date: 03/25/2006  . Smokeless tobacco: Never Used  . Alcohol use No  . Drug use: No  . Sexual activity: Not on file   Other Topics Concern  . Not on file     Social History Narrative  . No narrative on file    Review of Systems: As per HPI, otherwise negative  Physical Exam: Vital signs in last 24 hours: Temp:  [98.2 F (36.8 C)-105.3 F (40.7 C)] 100.8 F (38.2 C) (07/24 1324) Pulse Rate:  [94-142] 103 (07/24 1324) Resp:  [18-35] 21 (07/24 1324) BP: (108-179)/(47-91) 121/63 (07/24 1203) SpO2:  [90 %-100 %] 93 % (07/24 1324) Weight:  [230 lb (104.3 kg)-241 lb 4.8 oz (109.5 kg)] 241 lb 4.8 oz (109.5 kg) (07/24 0150)   General:   Alert,  Well-developed, well-nourished, pleasant and cooperative in NAD Head:  Normocephalic and atraumatic. Eyes:  Sclera clear, no icterus.   Conjunctiva pink. Nose:  No deformity, discharge,  or lesions. Mouth:  No deformity or lesions.   Neck:  Supple; no masses or thyromegaly. Lungs:  Clear throughout to auscultation.   No wheezes, crackles, or rhonchi.  Heart:  Regular rate and rhythm; no murmurs, clicks, rubs,  or gallops. Abdomen:  Soft, moderately tender in the epigastrium and right upper quadrant without rebound or guarding, BS active, nondistended, no palp mass or hsm.   Rectal:  Deferred  Msk:  Symmetrical without gross deformities. . Pulses:  Normal pulses noted. Extremities:  Without clubbing or edema. Neurologic:  Alert and  oriented x4;  grossly normal neurologically. Skin:  Intact without significant lesions or rashes.. Psych:  Alert and cooperative. Normal mood and affect.  Intake/Output from previous day: 07/23 0701 - 07/24 0700 In: 3498.3 [I.V.:3448.3; IV Piggyback:50] Out: 325 [Urine:325] Intake/Output this shift: Total I/O In: 250 [I.V.:200; IV Piggyback:50] Out: -  Lab Results:  Recent Labs  10/15/15 1832 10/16/15 0634  WBC 13.2* 10.0  HGB 14.2 12.8  HCT 43.6 41.2  PLT 210 188   BMET  Recent Labs  10/15/15 1832 10/16/15 0634  NA 133* 138  K 3.6 3.4*  CL 99* 107  CO2 24 24  GLUCOSE 235* 130*  BUN 9 <5*  CREATININE 0.88 0.89  CALCIUM 8.8* 7.9*    LFT  Recent Labs  10/16/15 0634  PROT 6.0*  ALBUMIN 2.7*  AST 107*  ALT 85*  ALKPHOS 274*  BILITOT 2.6*   PT/INR  Recent Labs  10/16/15 0201  LABPROT 16.3*  INR 1.30   Hepatitis Panel No results for input(s): HEPBSAG, HCVAB, HEPAIGM, HEPBIGM in the last 72 hours.    Studies/Results: Dg Chest 2 View  Result Date: 10/15/2015 CLINICAL DATA:  Fever, shortness of breath and chest pain for 1 day. EXAM: CHEST  2 VIEW COMPARISON:  CT chest and PA and lateral chest 01/30/2010. FINDINGS: The lungs are clear. Heart size is normal. No pneumothorax or pleural effusion. No focal bony abnormality. Surgical clips left upper quadrant of the abdomen are noted. IMPRESSION: No acute disease. Electronically Signed   By: Drusilla Kanner M.D.   On: 10/15/2015 19:09  Ct Abdomen Pelvis W Contrast  Result Date: 10/15/2015 CLINICAL DATA:  Generalized abdominal pain and nausea. EXAM: CT ABDOMEN AND PELVIS WITH CONTRAST TECHNIQUE: Multidetector CT imaging of the abdomen and pelvis was performed using the standard protocol following bolus administration of intravenous contrast. CONTRAST:  ISOVUE-300 IOPAMIDOL (ISOVUE-300) INJECTION 61% COMPARISON:  None. FINDINGS: Lower chest: Limited visualization of the lower thorax demonstrates minimal dependent subpleural ground-glass atelectasis, right greater than left. No discrete focal airspace opacities. No pleural effusion. Normal heart size.  No pericardial effusion. Hepatobiliary: Normal hepatic contour. No discrete hepatic lesions. The gallbladder is distended with gallbladder wall thickening and minimal amount of associated pericholecystic fluid. These findings are associated with mild centralized intrahepatic biliary ductal dilatation and suspected periportal edema. No definite radiopaque gallstones. No ascites. Pancreas: Normal appearance of the pancreas. No pancreatic duct dilatation. No peripancreatic stranding. Spleen: Normal appearance of the spleen.  No parent splenic stranding. Adrenals/Urinary Tract: There is symmetric enhancement and excretion of the bilateral kidneys. No definite renal stones this postcontrast examination. No discrete renal lesions. No urinary obstruction or perinephric stranding. Normal appearance of the bilateral adrenal glands. Normal appearance of the urinary bladder given degree distention. Stomach/Bowel: Sequela of prior gastric bypass surgery. No evidence of enteric obstruction Vascular/Lymphatic: Moderate amount of mixed calcified and noncalcified atherosclerotic plaque within a normal caliber abdominal aorta. The major branch vessels of the abdominal aorta appear patent on this non CTA examination. No bulky retroperitoneal, mesenteric, pelvic or inguinal lymphadenopathy on this noncontrast examination. Reproductive: Post hysterectomy. No discrete adnexal lesion. No free fluid in the pelvic cul-de-sac. Other: Regional soft tissues appear normal. Musculoskeletal: Post right total hip replacement. Note is made of an approximately 6.9 x 6.1 x 5.6 cm fluid collection about the anterior aspect of the right total hip prosthesis extending all the right iliacus musculature (representative axial image 86 and 72, series 21, coronal image 51, series 203). No discrete areas of osteolysis. IMPRESSION: 1. Findings worrisome for acute cholecystitis. Further evaluation nuclear medicine HIDA scan could be performed as clinically indicated. 2. Sequela of prior gastric bypass surgery without evidence of enteric obstruction. 3. Post right total hip replacement with a indeterminate fluid collection about the anterior aspect of the right total hip replacement  extending to involve the right iliacus musculature. Clinical correlation is advised. 4. Sequela of prior gastric bypass surgery without evidence of enteric obstruction. Electronically Signed   By: Simonne Come M.D.   On: 10/15/2015 22:53  US Abdomen Limited Ruq  Result Date: 10/15/2015 CLINICAL  DATA:  65 year old female with generalized abdominal pain and fever. EXAM: US ABDOMEN LIMITED - RIGHT UPPER QUADRANT COMPARISON:  None. FINDINGS: Gallbladder: There is sludge within the gallbladder. No significant gallbladder wall thickening or pericholecystic fluid. There is slight indistinctness of the gallbladder wall which may be technical. Mild edema is not excluded. Negative sonographic Murphy's sign. Common bile duct: Diameter: 6 mm Liver: There is mild diffuse increased hepatic echogenicity likely fatty infiltration. IMPRESSION: Gallbladder sludge. No definite sonographic evidence of acute cholecystitis. A hepatobiliary scintigraphy may provide better evaluation of the gallbladder if an acute cholecystitis is clinically suspected. Fatty liver. Electronically Signed   By: Elgie Collard M.D.   On: 10/15/2015 21:01   IMPRESSION:  65 y.o. female with PMH of GERD, dyslipidemia, obesity status post gastric bypass in the 1970s, fibromyalgia, hypothyroidism, and arthritis who was admitted with epigastric abdominal pain, nausea and vomiting found to have cholecystitis and elevated LFTs  PLAN: 1. Acute cholecystitis with elevated LFTs --her bilirubin has trended up slightly though this could be secondary to acute cholecystitis. Question of possible CBD stone is raised given elevated liver enzymes though this can be seen with cholecystitis alone. I recommended MRCP for further evaluation of the biliary tree to rule out choledocholithiasis. If negative, would recommend surgery proceed with cholecystectomy. If positive, she would need ERCP which could be more challenging given history of gastric bypass. If ERCP needed would likely need surgical involvement with procedure in the OR to assist in gaining access to the ampullary orifice.  I did discuss ERCP with her including the risks, benefits and alternatives. We discussed the risk of bleeding, infection, pancreatitis, and sedation-related complication. If  necessary she is willing to proceed.  Continue nothing by mouth, pain control, and trend liver enzymes. Follow-up MRCP   Yechiel Erny M  10/16/2015, 1:50 PM

## 2015-10-16 NOTE — H&P (Addendum)
History and Physical    Haley Larson UJW:[id number] DOB: [birth year] DOA: 10/15/2015  PCP: April Manson, NP   Patient coming from: Home   Chief Complaint: Abd pain, fever, N/V  HPI: Haley Larson is a 65 y.o. female with medical history significant for morbid obesity status post open gastric bypass in the 1970s, arthritis with chronic pain, hypothyroidism, and hyperlipidemia who presents the emergency department for evaluation of 4 days of worsening abdominal pain, nausea, and vomiting with development of fever. Patient reports that she had been in her usual state of health until the insidious development of generalized abdominal pain with nausea and vomiting approximately 4 days ago. As time went on, symptoms worsened and the pain became more localized to the right side of the upper abdomen. By 10/15/2015, she was experiencing fevers as well. Pain is described as severe, constant, localized to the right upper quadrant of the abdomen, worse with any oral intake, and better with lying still. Patient has never experienced similar symptoms previously and denies any history of biliary colic. She does have history of GERD for which she has been maintained on twice daily Protonix. Ms. Bryand denies chest pain, palpitations, dyspnea, or cough. She also denies dysuria or flank pain. She denies any recent use of antibiotics or any allergy to antibiotics.  ED Course: Upon arrival to the ED, patient is found to be febrile to 39.2 C, saturating only 90% on room air, tachycardic in the 120s, and with stable blood pressure. Chest x-ray is negative for acute cardiopulmonary disease and urinalysis is notable only for trace hemoglobin and 100 protein. CMP features mild hyponatremia and hypochloremia with serum glucose of 235, alkaline phosphatase 286, transaminases in the 100s, and total bilirubin of 1.6. CBC is notable for a leukocytosis to 13,200 with neutrophilic predominance and remaining CBC indices  normal. Right upper quadrant ultrasound was obtained and notable for the presence of gallbladder sludge, but no definitive evidence for acute cholecystitis. This was followed with CT of the abdomen and pelvis which features gallbladder distention, wall thickening, mild pericholecystic fluid, and mild dilation in the biliary ducts, all consistent with acute cholecystitis. Surgery was consulted by the ED physician and advised admission to the medical service for IV fluids and IV antibiotics with ongoing surgical follow-up and possible intervention.  Review of Systems:  All other systems reviewed and apart from HPI, are negative.  Past Medical History:  Diagnosis Date  . Arthritis   . Chest pain   . Dyslipidemia   . Family history of early CAD    STRONG  . Fibromyalgia   . GERD (gastroesophageal reflux disease)   . H/O: hysterectomy   . Hypothyroidism   . Morbid obesity (HCC)   . PONV (postoperative nausea and vomiting)   . Postmenopausal   . Seasonal allergies     Past Surgical History:  Procedure Laterality Date  . ABDOMINAL HYSTERECTOMY    . COLONOSCOPY W/ POLYPECTOMY    . GASTRIC BYPASS  1977  . ROTATOR CUFF REPAIR  04/2007  . ROTATOR CUFF REPAIR  08/2007  . TONSILLECTOMY    . TOTAL HIP ARTHROPLASTY Right 07/19/2015  . TOTAL HIP ARTHROPLASTY Right 07/19/2015   Procedure: TOTAL HIP ARTHROPLASTY ANTERIOR APPROACH;  Surgeon: Loreta Ave, MD;  Location: [facility];  Service: Orthopedics;  Laterality: Right;     reports that she quit smoking about 9 years ago. Her smoking use included Cigarettes. She has a 30.00 pack-year smoking history. She has never used  smokeless tobacco. She reports that she does not drink alcohol or use drugs.  No Known Allergies  Family History  Problem Relation Age of Onset  . Other Father     infection   . Coronary artery disease Mother     with stents  . Atrial fibrillation Mother     with pacemaker  . Heart failure Mother   . Diabetes Mother   .  Heart attack Brother   . Heart attack Brother 38    massive  MI  . Heart attack Brother 54    x3  . Heart attack Brother 30    with stent  . Coronary artery disease Brother     with stent  . Alcohol abuse Brother   . Coronary artery disease Brother     with stents     Prior to Admission medications   Medication Sig Start Date End Date Taking? Authorizing Provider  acetaminophen (TYLENOL) 500 MG tablet Take 500-1,000 mg by mouth every 6 (six) hours as needed for mild pain, fever or headache.   Yes Historical Provider, MD  aspirin EC 81 MG tablet Take 81 mg by mouth daily.   Yes Historical Provider, MD  estradiol (ESTRACE) 0.5 MG tablet Take 1 tablet by mouth daily. 10/27/13  Yes Historical Provider, MD  HYDROcodone-acetaminophen (NORCO) 5-325 MG tablet Take 1-2 tablets by mouth every 4 (four) hours as needed for moderate pain. Patient taking differently: Take 1 tablet by mouth 2 (two) times daily as needed for moderate pain.  07/20/15  Yes Cristie Hem, PA-C  levothyroxine (SYNTHROID, LEVOTHROID) 88 MCG tablet Take 88 mcg by mouth daily before breakfast.  09/16/12  Yes Historical Provider, MD  methocarbamol (ROBAXIN) 500 MG tablet Take 500 mg by mouth See admin instructions. Every 8-12 hours as needed for spasms   Yes Historical Provider, MD  montelukast (SINGULAIR) 10 MG tablet Take 1 tablet by mouth at bedtime. 10/05/13  Yes Historical Provider, MD  ondansetron (ZOFRAN-ODT) 8 MG disintegrating tablet Take 8 mg by mouth See admin instructions. Every 8-12 hours as needed for nausea/vomiting   Yes Historical Provider, MD  pantoprazole (PROTONIX) 40 MG tablet Take 40 mg by mouth 2 (two) times daily.   Yes Historical Provider, MD  pregabalin (LYRICA) 150 MG capsule Take 150 mg by mouth See admin instructions. One to two times daily   Yes Historical Provider, MD  rosuvastatin (CRESTOR) 10 MG tablet Take 10 mg by mouth every evening. 09/28/15  Yes Historical Provider, MD  DULoxetine (CYMBALTA) 60  MG capsule Take 60 mg by mouth daily.    Historical Provider, MD  ferrous sulfate 325 (65 FE) MG tablet Take 1 tablet (325 mg total) by mouth 2 (two) times daily with a meal. Patient not taking: Reported on 10/15/2015 07/22/15   Rolly Salter, MD  polyethylene glycol (MIRALAX / Ethelene Hal) packet Take 17 g by mouth 2 (two) times daily. Patient not taking: Reported on 10/15/2015 07/22/15   Rolly Salter, MD    Physical Exam: Vitals:   10/15/15 2330 10/15/15 2345 10/16/15 0015 10/16/15 0030  BP: 118/65 128/65 110/60 117/62  Pulse: 120 116 113 112  Resp: 20 19 19 19   Temp:      TempSrc:      SpO2: 95% 95% 93% 96%  Weight:      Height:          Constitutional: NAD, calm, in apparent discomfort Eyes: PERTLA, lids and conjunctivae normal ENMT: Mucous membranes are moist.  Posterior pharynx clear of any exudate or lesions.   Neck: normal, supple, no masses, no thyromegaly Respiratory: clear to auscultation bilaterally, no wheezing, no crackles. Normal respiratory effort.    Cardiovascular: S1 & S2 heard, regular rate and rhythm. No extremity edema. 2+ pedal pulses. No significant JVD. Abdomen: No distension, exquisitely tender at RUQ with rebound pain but no guarding, no masses palpated. Bowel sounds normal.  Musculoskeletal: no clubbing / cyanosis. No joint deformity upper and lower extremities. Normal muscle tone.  Skin: no significant rashes, lesions, ulcers. Warm, dry, well-perfused. Neurologic: CN 2-12 grossly intact. Sensation intact, DTR normal. Strength 5/5 in all 4 limbs.  Psychiatric: Normal judgment and insight. Alert and oriented x 3. Normal mood and affect.     Labs on Admission: I have personally reviewed following labs and imaging studies  CBC:  Recent Labs Lab 10/15/15 1832  WBC 13.2*  NEUTROABS 11.7*  HGB 14.2  HCT 43.6  MCV 92.8  PLT 210   Basic Metabolic Panel:  Recent Labs Lab 10/15/15 1832  NA 133*  K 3.6  CL 99*  CO2 24  GLUCOSE 235*  BUN 9    CREATININE 0.88  CALCIUM 8.8*   GFR: Estimated Creatinine Clearance: 77 mL/min (by C-G formula based on SCr of 0.88 mg/dL). Liver Function Tests:  Recent Labs Lab 10/15/15 1832  AST 147*  ALT 106*  ALKPHOS 286*  BILITOT 1.6*  PROT 7.2  ALBUMIN 3.4*    Recent Labs Lab 10/15/15 1836  LIPASE 18   No results for input(s): AMMONIA in the last 168 hours. Coagulation Profile: No results for input(s): INR, PROTIME in the last 168 hours. Cardiac Enzymes: No results for input(s): CKTOTAL, CKMB, CKMBINDEX, TROPONINI in the last 168 hours. BNP (last 3 results) No results for input(s): PROBNP in the last 8760 hours. HbA1C: No results for input(s): HGBA1C in the last 72 hours. CBG: No results for input(s): GLUCAP in the last 168 hours. Lipid Profile: No results for input(s): CHOL, HDL, LDLCALC, TRIG, CHOLHDL, LDLDIRECT in the last 72 hours. Thyroid Function Tests: No results for input(s): TSH, T4TOTAL, FREET4, T3FREE, THYROIDAB in the last 72 hours. Anemia Panel: No results for input(s): VITAMINB12, FOLATE, FERRITIN, TIBC, IRON, RETICCTPCT in the last 72 hours. Urine analysis:    Component Value Date/Time   COLORURINE YELLOW 10/15/2015 2149   APPEARANCEUR CLEAR 10/15/2015 2149   LABSPEC 1.023 10/15/2015 2149   PHURINE 7.0 10/15/2015 2149   GLUCOSEU 100 (A) 10/15/2015 2149   HGBUR TRACE (A) 10/15/2015 2149   BILIRUBINUR MODERATE (A) 10/15/2015 2149   KETONESUR NEGATIVE 10/15/2015 2149   PROTEINUR 100 (A) 10/15/2015 2149   UROBILINOGEN 0.2 01/30/2010 0454   NITRITE NEGATIVE 10/15/2015 2149   LEUKOCYTESUR NEGATIVE 10/15/2015 2149   Sepsis Labs: @LABRCNTIP (procalcitonin:4,lacticidven:4) )No results found for this or any previous visit (from the past 240 hour(s)).   Radiological Exams on Admission: Dg Chest 2 View  Result Date: 10/15/2015 CLINICAL DATA:  Fever, shortness of breath and chest pain for 1 day. EXAM: CHEST  2 VIEW COMPARISON:  CT chest and PA and lateral  chest 01/30/2010. FINDINGS: The lungs are clear. Heart size is normal. No pneumothorax or pleural effusion. No focal bony abnormality. Surgical clips left upper quadrant of the abdomen are noted. IMPRESSION: No acute disease. Electronically Signed   By: Drusilla Kanner M.D.   On: 10/15/2015 19:09  Ct Abdomen Pelvis W Contrast  Result Date: 10/15/2015 CLINICAL DATA:  Generalized abdominal pain and nausea. EXAM: CT ABDOMEN AND  PELVIS WITH CONTRAST TECHNIQUE: Multidetector CT imaging of the abdomen and pelvis was performed using the standard protocol following bolus administration of intravenous contrast. CONTRAST:  ISOVUE-300 IOPAMIDOL (ISOVUE-300) INJECTION 61% COMPARISON:  None. FINDINGS: Lower chest: Limited visualization of the lower thorax demonstrates minimal dependent subpleural ground-glass atelectasis, right greater than left. No discrete focal airspace opacities. No pleural effusion. Normal heart size.  No pericardial effusion. Hepatobiliary: Normal hepatic contour. No discrete hepatic lesions. The gallbladder is distended with gallbladder wall thickening and minimal amount of associated pericholecystic fluid. These findings are associated with mild centralized intrahepatic biliary ductal dilatation and suspected periportal edema. No definite radiopaque gallstones. No ascites. Pancreas: Normal appearance of the pancreas. No pancreatic duct dilatation. No peripancreatic stranding. Spleen: Normal appearance of the spleen. No parent splenic stranding. Adrenals/Urinary Tract: There is symmetric enhancement and excretion of the bilateral kidneys. No definite renal stones this postcontrast examination. No discrete renal lesions. No urinary obstruction or perinephric stranding. Normal appearance of the bilateral adrenal glands. Normal appearance of the urinary bladder given degree distention. Stomach/Bowel: Sequela of prior gastric bypass surgery. No evidence of enteric obstruction Vascular/Lymphatic:  Moderate amount of mixed calcified and noncalcified atherosclerotic plaque within a normal caliber abdominal aorta. The major branch vessels of the abdominal aorta appear patent on this non CTA examination. No bulky retroperitoneal, mesenteric, pelvic or inguinal lymphadenopathy on this noncontrast examination. Reproductive: Post hysterectomy. No discrete adnexal lesion. No free fluid in the pelvic cul-de-sac. Other: Regional soft tissues appear normal. Musculoskeletal: Post right total hip replacement. Note is made of an approximately 6.9 x 6.1 x 5.6 cm fluid collection about the anterior aspect of the right total hip prosthesis extending all the right iliacus musculature (representative axial image 86 and 72, series 21, coronal image 51, series 203). No discrete areas of osteolysis. IMPRESSION: 1. Findings worrisome for acute cholecystitis. Further evaluation nuclear medicine HIDA scan could be performed as clinically indicated. 2. Sequela of prior gastric bypass surgery without evidence of enteric obstruction. 3. Post right total hip replacement with a indeterminate fluid collection about the anterior aspect of the right total hip replacement extending to involve the right iliacus musculature. Clinical correlation is advised. 4. Sequela of prior gastric bypass surgery without evidence of enteric obstruction. Electronically Signed   By: Simonne Come M.D.   On: 10/15/2015 22:53  US Abdomen Limited Ruq  Result Date: 10/15/2015 CLINICAL DATA:  65 year old female with generalized abdominal pain and fever. EXAM: US ABDOMEN LIMITED - RIGHT UPPER QUADRANT COMPARISON:  None. FINDINGS: Gallbladder: There is sludge within the gallbladder. No significant gallbladder wall thickening or pericholecystic fluid. There is slight indistinctness of the gallbladder wall which may be technical. Mild edema is not excluded. Negative sonographic Murphy's sign. Common bile duct: Diameter: 6 mm Liver: There is mild diffuse increased  hepatic echogenicity likely fatty infiltration. IMPRESSION: Gallbladder sludge. No definite sonographic evidence of acute cholecystitis. A hepatobiliary scintigraphy may provide better evaluation of the gallbladder if an acute cholecystitis is clinically suspected. Fatty liver. Electronically Signed   By: Elgie Collard M.D.   On: 10/15/2015 21:01   EKG: Independently reviewed.   Assessment/Plan  1. Sepsis secondary to acute cholecystitis vs cholangitis - Presents with fever, leukocytosis, tachycardia, elevated LFT's, and imaging suggestive of acute cholecystitis  - Surgery is consulting and much appreciated; there is concern for acute cholangitis   - 30 cc/kg NS bolus has been given, will continue NS at 100 cc/hr for now - Blood and urine cultures are incubating, will  follow and tailor abx as indicated  - Started on empiric Zosyn, will continue with this monotherapy  - Initial lactate 2.89, normalized to 1.85 after fluid bolus  - Keep NPO  - Optimize for potential surgery, hold ASA, type & screen  2. Hyponatremia   - Serum sodium 133 on admission in setting of dehydration  - Anticipate will normalize following the fluid-resuscitation  - CMP to be repeated in am    3. GERD  - Stable, managed with Protonix BID at home - Continue Protonix q12h, converted to IV while NPO    4. Hypothyroidism  - Appears to be stable  - Resume Synthroid once appropriate for PO intake    5. Arthritis with chronic pain  - Stable, managed with APAP and prn Norco at home  - Currently controlling abd pain with fentanyl injections    6. Hyperglycemia  - No A1c on file  - Serum glucose 235 on admission, possibly secondary to sepsis; no hx of DM  - Check CBG q4h for now; low-intensity SSI correctional prn  - Check A1c, pending    DVT prophylaxis: sq heparin, SCD's  Code Status: Full  Family Communication: Husband updated at bedside  Disposition Plan: Admit to stepdown  Consults called:  Surgery Admission status: Inpatient    Briscoe Deutscher, MD [facility] Pager [phone]  If 7PM-7AM, please contact night-coverage [email or web address] Password [email or web address]  10/16/2015, 1:04 AM

## 2015-10-16 NOTE — Progress Notes (Addendum)
Subjective: Still with RUQ pain, bloating Nausea improved  Total bilirubin increasing  Objective: Vital signs in last 24 hours: Temp:  [98.2 F (36.8 C)-105.3 F (40.7 C)] 99.6 F (37.6 C) (07/24 0756) Pulse Rate:  [94-142] 102 (07/24 0756) Resp:  [18-35] 18 (07/24 0756) BP: (108-179)/(47-91) 125/81 (07/24 0756) SpO2:  [90 %-100 %] 100 % (07/24 0756) Weight:  [104.3 kg (230 lb)-109.5 kg (241 lb 4.8 oz)] 109.5 kg (241 lb 4.8 oz) (07/24 0150)    Intake/Output from previous day: 07/23 0701 - 07/24 0700 In: 3398.3 [I.V.:3348.3; IV Piggyback:50] Out: 325 [Urine:325] Intake/Output this shift: No intake/output data recorded.  General appearance: alert, cooperative and no distress Eyes: conjunctivae/corneas clear. PERRL, EOM's intact. Fundi benign., no scleral icterus Resp: clear to auscultation bilaterally Cardio: regular rate and rhythm, S1, S2 normal, no murmur, click, rub or gallop GI: mildly distended; tender in RUQ  Lab Results:   Recent Labs  10/15/15 1832 10/16/15 0634  WBC 13.2* 10.0  HGB 14.2 12.8  HCT 43.6 41.2  PLT 210 188   BMET  Recent Labs  10/15/15 1832 10/16/15 0634  NA 133* 138  K 3.6 3.4*  CL 99* 107  CO2 24 24  GLUCOSE 235* 130*  BUN 9 <5*  CREATININE 0.88 0.89  CALCIUM 8.8* 7.9*   Hepatic Function Latest Ref Rng & Units 10/16/2015 10/15/2015 05/06/2007  Total Protein 6.5 - 8.1 g/dL 6.0(L) 7.2 7.2  Albumin 3.5 - 5.0 g/dL 2.7(L) 3.4(L) 3.4(L)  AST 15 - 41 U/L 107(H) 147(H) 39(H)  ALT 14 - 54 U/L 85(H) 106(H) 49(H)  Alk Phosphatase 38 - 126 U/L 274(H) 286(H) 119(H)  Total Bilirubin 0.3 - 1.2 mg/dL 2.6(H) 1.6(H) 0.4  Bilirubin, Direct - - - 0.2    PT/INR  Recent Labs  10/16/15 0201  LABPROT 16.3*  INR 1.30   ABG No results for input(s): PHART, HCO3 in the last 72 hours.  Invalid input(s): PCO2, PO2  Studies/Results: Dg Chest 2 View  Result Date: 10/15/2015 CLINICAL DATA:  Fever, shortness of breath and chest pain for 1 day.  EXAM: CHEST  2 VIEW COMPARISON:  CT chest and PA and lateral chest 01/30/2010. FINDINGS: The lungs are clear. Heart size is normal. No pneumothorax or pleural effusion. No focal bony abnormality. Surgical clips left upper quadrant of the abdomen are noted. IMPRESSION: No acute disease. Electronically Signed   By: Inge Rise M.D.   On: 10/15/2015 19:09  Ct Abdomen Pelvis W Contrast  Result Date: 10/15/2015 CLINICAL DATA:  Generalized abdominal pain and nausea. EXAM: CT ABDOMEN AND PELVIS WITH CONTRAST TECHNIQUE: Multidetector CT imaging of the abdomen and pelvis was performed using the standard protocol following bolus administration of intravenous contrast. CONTRAST:  145m ISOVUE-300 IOPAMIDOL (ISOVUE-300) INJECTION 61% COMPARISON:  None. FINDINGS: Lower chest: Limited visualization of the lower thorax demonstrates minimal dependent subpleural ground-glass atelectasis, right greater than left. No discrete focal airspace opacities. No pleural effusion. Normal heart size.  No pericardial effusion. Hepatobiliary: Normal hepatic contour. No discrete hepatic lesions. The gallbladder is distended with gallbladder wall thickening and minimal amount of associated pericholecystic fluid. These findings are associated with mild centralized intrahepatic biliary ductal dilatation and suspected periportal edema. No definite radiopaque gallstones. No ascites. Pancreas: Normal appearance of the pancreas. No pancreatic duct dilatation. No peripancreatic stranding. Spleen: Normal appearance of the spleen. No parent splenic stranding. Adrenals/Urinary Tract: There is symmetric enhancement and excretion of the bilateral kidneys. No definite renal stones this postcontrast examination. No discrete renal lesions.  No urinary obstruction or perinephric stranding. Normal appearance of the bilateral adrenal glands. Normal appearance of the urinary bladder given degree distention. Stomach/Bowel: Sequela of prior gastric bypass  surgery. No evidence of enteric obstruction Vascular/Lymphatic: Moderate amount of mixed calcified and noncalcified atherosclerotic plaque within a normal caliber abdominal aorta. The major branch vessels of the abdominal aorta appear patent on this non CTA examination. No bulky retroperitoneal, mesenteric, pelvic or inguinal lymphadenopathy on this noncontrast examination. Reproductive: Post hysterectomy. No discrete adnexal lesion. No free fluid in the pelvic cul-de-sac. Other: Regional soft tissues appear normal. Musculoskeletal: Post right total hip replacement. Note is made of an approximately 6.9 x 6.1 x 5.6 cm fluid collection about the anterior aspect of the right total hip prosthesis extending all the right iliacus musculature (representative axial image 86 and 72, series 21, coronal image 51, series 203). No discrete areas of osteolysis. IMPRESSION: 1. Findings worrisome for acute cholecystitis. Further evaluation nuclear medicine HIDA scan could be performed as clinically indicated. 2. Sequela of prior gastric bypass surgery without evidence of enteric obstruction. 3. Post right total hip replacement with a indeterminate fluid collection about the anterior aspect of the right total hip replacement extending to involve the right iliacus musculature. Clinical correlation is advised. 4. Sequela of prior gastric bypass surgery without evidence of enteric obstruction. Electronically Signed   By: Sandi Mariscal M.D.   On: 10/15/2015 22:53  US Abdomen Limited Ruq  Result Date: 10/15/2015 CLINICAL DATA:  65 year old female with generalized abdominal pain and fever. EXAM: US ABDOMEN LIMITED - RIGHT UPPER QUADRANT COMPARISON:  None. FINDINGS: Gallbladder: There is sludge within the gallbladder. No significant gallbladder wall thickening or pericholecystic fluid. There is slight indistinctness of the gallbladder wall which may be technical. Mild edema is not excluded. Negative sonographic Murphy's sign. Common bile  duct: Diameter: 6 mm Liver: There is mild diffuse increased hepatic echogenicity likely fatty infiltration. IMPRESSION: Gallbladder sludge. No definite sonographic evidence of acute cholecystitis. A hepatobiliary scintigraphy may provide better evaluation of the gallbladder if an acute cholecystitis is clinically suspected. Fatty liver. Electronically Signed   By: Anner Crete M.D.   On: 10/15/2015 21:01   Anti-infectives: Anti-infectives    Start     Dose/Rate Route Frequency Ordered Stop   10/16/15 0400  piperacillin-tazobactam (ZOSYN) IVPB 3.375 g    Comments:  Zosyn 3.375 g IV q8h for CrCl > 20 mL/min   3.375 g 12.5 mL/hr over 240 Minutes Intravenous Every 8 hours 10/16/15 0105     10/15/15 2100  piperacillin-tazobactam (ZOSYN) IVPB 3.375 g     3.375 g 100 mL/hr over 30 Minutes Intravenous  Once 10/15/15 2056 10/15/15 2131      Assessment/Plan: Acute cholecystitis - possible common bile duct obstruction with increasing bilirubin Will need laparoscopic cholecystectomy, but may need pre-op ERCP I have called Dr. Collene Mares to consider ERCP since she has performed colonoscopy on this patient before.  I would be agreeable to ERCP/ lap chole under same anesthetic if felt appropriate by GI.  LOS: 0 days    Olan Kurek K. 10/16/2015

## 2015-10-17 ENCOUNTER — Inpatient Hospital Stay (HOSPITAL_COMMUNITY): Payer: Managed Care, Other (non HMO)

## 2015-10-17 ENCOUNTER — Encounter (HOSPITAL_COMMUNITY)
Admission: EM | Disposition: A | Discharge status: Home / Self Care | Payer: Self-pay | Source: Home / Self Care | Attending: Internal Medicine

## 2015-10-17 ENCOUNTER — Inpatient Hospital Stay (HOSPITAL_COMMUNITY): Payer: Managed Care, Other (non HMO) | Admitting: Anesthesiology

## 2015-10-17 ENCOUNTER — Encounter (HOSPITAL_COMMUNITY): Payer: Self-pay | Admitting: Anesthesiology

## 2015-10-17 DIAGNOSIS — R7989 Other specified abnormal findings of blood chemistry: Secondary | ICD-10-CM

## 2015-10-17 DIAGNOSIS — R945 Abnormal results of liver function studies: Secondary | ICD-10-CM

## 2015-10-17 DIAGNOSIS — Z419 Encounter for procedure for purposes other than remedying health state, unspecified: Secondary | ICD-10-CM

## 2015-10-17 HISTORY — PX: CHOLECYSTECTOMY: SHX55

## 2015-10-17 LAB — GLUCOSE, CAPILLARY
GLUCOSE-CAPILLARY: 109 mg/dL — AB (ref 65–99)
GLUCOSE-CAPILLARY: 130 mg/dL — AB (ref 65–99)
GLUCOSE-CAPILLARY: 111 mg/dL — AB (ref 65–99)
GLUCOSE-CAPILLARY: 120 mg/dL — AB (ref 65–99)
GLUCOSE-CAPILLARY: 149 mg/dL — AB (ref 65–99)
GLUCOSE-CAPILLARY: 101 mg/dL — AB (ref 65–99)
Glucose-Capillary: 106 mg/dL — ABNORMAL HIGH (ref 65–99)

## 2015-10-17 LAB — COMPREHENSIVE METABOLIC PANEL
ALBUMIN: 2.2 g/dL — AB (ref 3.5–5.0)
ALT: 55 U/L — AB (ref 14–54)
AST: 56 U/L — AB (ref 15–41)
Alkaline Phosphatase: 211 U/L — ABNORMAL HIGH (ref 38–126)
Anion gap: 5 (ref 5–15)
BUN: 6 mg/dL (ref 6–20)
CHLORIDE: 108 mmol/L (ref 101–111)
CO2: 24 mmol/L (ref 22–32)
CREATININE: 0.75 mg/dL (ref 0.44–1.00)
Calcium: 7.6 mg/dL — ABNORMAL LOW (ref 8.9–10.3)
GFR calc Af Amer: >60 mL/min (ref >60)
GFR calc non Af Amer: >60 mL/min (ref >60)
GLUCOSE: 128 mg/dL — AB (ref 65–99)
POTASSIUM: 3 mmol/L — AB (ref 3.5–5.1)
Sodium: 137 mmol/L (ref 135–145)
Total Bilirubin: 1.2 mg/dL (ref 0.3–1.2)
Total Protein: 5.3 g/dL — ABNORMAL LOW (ref 6.5–8.1)

## 2015-10-17 LAB — CBC
HCT: 33.3 % — ABNORMAL LOW (ref 36.0–46.0)
Hemoglobin: 10.3 g/dL — ABNORMAL LOW (ref 12.0–15.0)
MCH: 28.5 pg (ref 26.0–34.0)
MCHC: 30.9 g/dL (ref 30.0–36.0)
MCV: 92.2 fL (ref 78.0–100.0)
PLATELETS: 164 10*3/uL (ref 150–400)
RBC: 3.61 MIL/uL — AB (ref 3.87–5.11)
RDW: 14 % (ref 11.5–15.5)
WBC: 7.4 10*3/uL (ref 4.0–10.5)

## 2015-10-17 LAB — URINE CULTURE: CULTURE: NO GROWTH

## 2015-10-17 LAB — HEMOGLOBIN A1C
Hgb A1c MFr Bld: 5.5 % (ref 4.8–5.6)
MEAN PLASMA GLUCOSE: 111 mg/dL

## 2015-10-17 SURGERY — LAPAROSCOPIC CHOLECYSTECTOMY WITH INTRAOPERATIVE CHOLANGIOGRAM
Anesthesia: General | Site: Abdomen

## 2015-10-17 MED ORDER — GABAPENTIN 300 MG PO CAPS: 300.0000 mg | ORAL_CAPSULE | ORAL | Status: DC

## 2015-10-17 MED ORDER — SUGAMMADEX SODIUM 500 MG/5ML IV SOLN
INTRAVENOUS | Status: DC | PRN
  Administered 2015-10-17: 447.6 mg via INTRAVENOUS

## 2015-10-17 MED ORDER — ONDANSETRON HCL 4 MG/2ML IJ SOLN
INTRAMUSCULAR | Status: DC | PRN
  Administered 2015-10-17: 4 mg via INTRAVENOUS

## 2015-10-17 MED ORDER — HYDROMORPHONE HCL 1 MG/ML IJ SOLN
0.2500 mg | INTRAMUSCULAR | Status: DC | PRN
  Administered 2015-10-17 (×2): 0.5 mg via INTRAVENOUS

## 2015-10-17 MED ORDER — CHLORHEXIDINE GLUCONATE CLOTH 2 % EX PADS
6.0000 | MEDICATED_PAD | Freq: Once | CUTANEOUS | Status: AC
  Administered 2015-10-17: 6 via TOPICAL

## 2015-10-17 MED ORDER — PROPOFOL 10 MG/ML IV BOLUS
INTRAVENOUS | Status: AC
  Filled 2015-10-17: qty 20

## 2015-10-17 MED ORDER — PROMETHAZINE HCL 25 MG/ML IJ SOLN: 6.2500 mg | INTRAMUSCULAR | Status: DC | PRN

## 2015-10-17 MED ORDER — FENTANYL CITRATE (PF) 100 MCG/2ML IJ SOLN
INTRAMUSCULAR | Status: DC | PRN
  Administered 2015-10-17: 50 ug via INTRAVENOUS
  Administered 2015-10-17: 100 ug via INTRAVENOUS
  Administered 2015-10-17 (×2): 50 ug via INTRAVENOUS

## 2015-10-17 MED ORDER — ACETAMINOPHEN 500 MG PO TABS: 1000.0000 mg | ORAL_TABLET | ORAL | Status: DC

## 2015-10-17 MED ORDER — BUPIVACAINE-EPINEPHRINE (PF) 0.25% -1:200000 IJ SOLN
INTRAMUSCULAR | Status: AC
  Filled 2015-10-17: qty 30

## 2015-10-17 MED ORDER — ENOXAPARIN SODIUM 40 MG/0.4ML ~~LOC~~ SOLN: 40.0000 mg | SUBCUTANEOUS | Status: DC

## 2015-10-17 MED ORDER — SODIUM CHLORIDE 0.9 % IV SOLN
INTRAVENOUS | Status: DC | PRN
  Administered 2015-10-17: 10 mL

## 2015-10-17 MED ORDER — LACTATED RINGERS IV SOLN
INTRAVENOUS | Status: DC | PRN
  Administered 2015-10-17: 10:00:00 via INTRAVENOUS

## 2015-10-17 MED ORDER — LIDOCAINE HCL (CARDIAC) 20 MG/ML IV SOLN
INTRAVENOUS | Status: DC | PRN
  Administered 2015-10-17: 70 mg via INTRAVENOUS

## 2015-10-17 MED ORDER — HEMOSTATIC AGENTS (NO CHARGE) OPTIME
TOPICAL | Status: DC | PRN
  Administered 2015-10-17: 1 via TOPICAL

## 2015-10-17 MED ORDER — MIDAZOLAM HCL 2 MG/2ML IJ SOLN
INTRAMUSCULAR | Status: AC
  Filled 2015-10-17: qty 2

## 2015-10-17 MED ORDER — PANTOPRAZOLE SODIUM 40 MG PO TBEC
40.0000 mg | DELAYED_RELEASE_TABLET | Freq: Two times a day (BID) | ORAL | Status: DC
  Administered 2015-10-17 – 2015-10-24 (×12): 40 mg via ORAL
  Filled 2015-10-17 (×12): qty 1

## 2015-10-17 MED ORDER — BUPIVACAINE-EPINEPHRINE 0.25% -1:200000 IJ SOLN
INTRAMUSCULAR | Status: DC | PRN
  Administered 2015-10-17: 8 mL

## 2015-10-17 MED ORDER — IOPAMIDOL (ISOVUE-300) INJECTION 61%
INTRAVENOUS | Status: AC
  Filled 2015-10-17: qty 50

## 2015-10-17 MED ORDER — FENTANYL CITRATE (PF) 250 MCG/5ML IJ SOLN
INTRAMUSCULAR | Status: AC
  Filled 2015-10-17: qty 5

## 2015-10-17 MED ORDER — ROCURONIUM BROMIDE 100 MG/10ML IV SOLN
INTRAVENOUS | Status: DC | PRN
  Administered 2015-10-17: 35 mg via INTRAVENOUS
  Administered 2015-10-17: 15 mg via INTRAVENOUS

## 2015-10-17 MED ORDER — SODIUM CHLORIDE 0.9 % IR SOLN
Status: DC | PRN
  Administered 2015-10-17 (×2): 1000 mL

## 2015-10-17 MED ORDER — CELECOXIB 400 MG PO CAPS: 400.0000 mg | ORAL_CAPSULE | ORAL | Status: DC

## 2015-10-17 MED ORDER — 0.9 % SODIUM CHLORIDE (POUR BTL) OPTIME
TOPICAL | Status: DC | PRN
  Administered 2015-10-17: 1000 mL

## 2015-10-17 MED ORDER — SUCCINYLCHOLINE 20MG/ML (10ML) SYRINGE FOR MEDFUSION PUMP - OPTIME
INTRAMUSCULAR | Status: DC | PRN
  Administered 2015-10-17: 100 mg via INTRAVENOUS

## 2015-10-17 MED ORDER — PROPOFOL 10 MG/ML IV BOLUS
INTRAVENOUS | Status: DC | PRN
  Administered 2015-10-17: 60 mg via INTRAVENOUS
  Administered 2015-10-17: 140 mg via INTRAVENOUS

## 2015-10-17 MED ORDER — HYDROMORPHONE HCL 1 MG/ML IJ SOLN
INTRAMUSCULAR | Status: AC
  Filled 2015-10-17: qty 1

## 2015-10-17 MED ORDER — MIDAZOLAM HCL 5 MG/5ML IJ SOLN
INTRAMUSCULAR | Status: DC | PRN
  Administered 2015-10-17: 2 mg via INTRAVENOUS

## 2015-10-17 SURGICAL SUPPLY — 50 items
APL SKNCLS STERI-STRIP NONHPOA (GAUZE/BANDAGES/DRESSINGS) ×1
APPLIER CLIP ROT 10 11.4 M/L (STAPLE) ×3
APR CLP MED LRG 11.4X10 (STAPLE) ×1
BAG SPEC RTRVL LRG 6X4 10 (ENDOMECHANICALS) ×1
BENZOIN TINCTURE PRP APPL 2/3 (GAUZE/BANDAGES/DRESSINGS) ×3 IMPLANT
CANISTER SUCTION 2500CC (MISCELLANEOUS) ×3 IMPLANT
CHLORAPREP W/TINT 26ML (MISCELLANEOUS) ×3 IMPLANT
CLIP APPLIE ROT 10 11.4 M/L (STAPLE) ×1 IMPLANT
CLOSURE WOUND 1/2 X4 (GAUZE/BANDAGES/DRESSINGS) ×1
COVER MAYO STAND STRL (DRAPES) ×3 IMPLANT
COVER SURGICAL LIGHT HANDLE (MISCELLANEOUS) ×6 IMPLANT
DRAPE C-ARM 42X72 X-RAY (DRAPES) ×3 IMPLANT
DRSG TEGADERM 2-3/8X2-3/4 SM (GAUZE/BANDAGES/DRESSINGS) ×9 IMPLANT
DRSG TEGADERM 4X4.75 (GAUZE/BANDAGES/DRESSINGS) ×3 IMPLANT
ELECT REM PT RETURN 9FT ADLT (ELECTROSURGICAL) ×3
ELECTRODE REM PT RTRN 9FT ADLT (ELECTROSURGICAL) ×1 IMPLANT
FILTER SMOKE EVAC LAPAROSHD (FILTER) ×3 IMPLANT
GAUZE SPONGE 2X2 8PLY STRL LF (GAUZE/BANDAGES/DRESSINGS) ×1 IMPLANT
GLOVE BIO SURGEON STRL SZ7 (GLOVE) ×3 IMPLANT
GLOVE BIOGEL PI IND STRL 7.5 (GLOVE) ×1 IMPLANT
GLOVE BIOGEL PI IND STRL 8.5 (GLOVE) IMPLANT
GLOVE BIOGEL PI INDICATOR 7.5 (GLOVE) ×4
GLOVE BIOGEL PI INDICATOR 8.5 (GLOVE) ×2
GLOVE ECLIPSE 7.5 STRL STRAW (GLOVE) ×2 IMPLANT
GLOVE SURG SS PI 8.0 STRL IVOR (GLOVE) ×3 IMPLANT
GOWN STRL REUS W/ TWL LRG LVL3 (GOWN DISPOSABLE) ×3 IMPLANT
GOWN STRL REUS W/ TWL XL LVL3 (GOWN DISPOSABLE) IMPLANT
GOWN STRL REUS W/TWL LRG LVL3 (GOWN DISPOSABLE) ×6
GOWN STRL REUS W/TWL XL LVL3 (GOWN DISPOSABLE) ×3
KIT BASIN OR (CUSTOM PROCEDURE TRAY) ×3 IMPLANT
KIT ROOM TURNOVER OR (KITS) ×3 IMPLANT
NS IRRIG 1000ML POUR BTL (IV SOLUTION) ×3 IMPLANT
PAD ARMBOARD 7.5X6 YLW CONV (MISCELLANEOUS) ×3 IMPLANT
POUCH SPECIMEN RETRIEVAL 10MM (ENDOMECHANICALS) ×3 IMPLANT
SCISSORS LAP 5X35 DISP (ENDOMECHANICALS) ×3 IMPLANT
SET CHOLANGIOGRAPH 5 50 .035 (SET/KITS/TRAYS/PACK) ×3 IMPLANT
SET IRRIG TUBING LAPAROSCOPIC (IRRIGATION / IRRIGATOR) ×3 IMPLANT
SLEEVE ENDOPATH XCEL 5M (ENDOMECHANICALS) ×3 IMPLANT
SPECIMEN JAR SMALL (MISCELLANEOUS) ×3 IMPLANT
SPONGE GAUZE 2X2 STER 10/PKG (GAUZE/BANDAGES/DRESSINGS) ×2
STRIP CLOSURE SKIN 1/2X4 (GAUZE/BANDAGES/DRESSINGS) ×2 IMPLANT
SUT MNCRL AB 4-0 PS2 18 (SUTURE) ×3 IMPLANT
SUT MON AB 4-0 PC3 18 (SUTURE) ×2 IMPLANT
TOWEL OR 17X24 6PK STRL BLUE (TOWEL DISPOSABLE) ×3 IMPLANT
TOWEL OR 17X26 10 PK STRL BLUE (TOWEL DISPOSABLE) ×3 IMPLANT
TRAY LAPAROSCOPIC MC (CUSTOM PROCEDURE TRAY) ×3 IMPLANT
TROCAR XCEL BLUNT TIP 100MML (ENDOMECHANICALS) ×3 IMPLANT
TROCAR XCEL NON-BLD 11X100MML (ENDOMECHANICALS) ×3 IMPLANT
TROCAR XCEL NON-BLD 5MMX100MML (ENDOMECHANICALS) ×3 IMPLANT
TUBING INSUFFLATION (TUBING) ×3 IMPLANT

## 2015-10-17 NOTE — Anesthesia Preprocedure Evaluation (Signed)
Anesthesia Evaluation  Patient identified by MRN, date of birth, ID band Patient awake    Reviewed: Allergy & Precautions, H&P , NPO status , Patient's Chart, lab work & pertinent test results  History of Anesthesia Complications (+) PONV  Airway Mallampati: II  TM Distance: >3 FB Neck ROM: full    Dental no notable dental hx. (+) Dental Advisory Given, Teeth Intact   Pulmonary shortness of breath and with exertion, former smoker,    Pulmonary exam normal breath sounds clear to auscultation       Cardiovascular Exercise Tolerance: Good negative cardio ROS Normal cardiovascular exam Rhythm:regular Rate:Normal  History of CP but normal stress scan in 2014   Neuro/Psych negative neurological ROS  negative psych ROS   GI/Hepatic negative GI ROS, Neg liver ROS, GERD  Medicated and Controlled,  Endo/Other  Hypothyroidism Morbid obesity  Renal/GU negative Renal ROS  negative genitourinary   Musculoskeletal  (+) Arthritis , Fibromyalgia -  Abdominal   Peds  Hematology negative hematology ROS (+)   Anesthesia Other Findings   Reproductive/Obstetrics negative OB ROS                             Anesthesia Physical  Anesthesia Plan  ASA: III  Anesthesia Plan: General   Post-op Pain Management:    Induction: Intravenous  Airway Management Planned: Oral ETT  Additional Equipment:   Intra-op Plan:   Post-operative Plan: Extubation in OR  Informed Consent: I have reviewed the patients History and Physical, chart, labs and discussed the procedure including the risks, benefits and alternatives for the proposed anesthesia with the patient or authorized representative who has indicated his/her understanding and acceptance.   Dental Advisory Given  Plan Discussed with: CRNA and Surgeon  Anesthesia Plan Comments:         Anesthesia Quick Evaluation

## 2015-10-17 NOTE — Anesthesia Procedure Notes (Signed)
Procedure Name: Intubation Date/Time: 10/17/2015 10:39 AM Performed by: Carmela Rima Pre-anesthesia Checklist: Patient being monitored, Timeout performed, Suction available, Patient identified and Emergency Drugs available Patient Re-evaluated:Patient Re-evaluated prior to inductionOxygen Delivery Method: Circle system utilized Preoxygenation: Pre-oxygenation with 100% oxygen Intubation Type: IV induction and Rapid sequence Ventilation: Mask ventilation without difficulty Laryngoscope Size: Mac and 3 Grade View: Grade I Tube type: Oral Tube size: 7.0 mm Number of attempts: 1 Placement Confirmation: breath sounds checked- equal and bilateral,  positive ETCO2 and ETT inserted through vocal cords under direct vision Secured at: 21 cm Tube secured with: Tape Dental Injury: Teeth and Oropharynx as per pre-operative assessment

## 2015-10-17 NOTE — Anesthesia Postprocedure Evaluation (Signed)
Anesthesia Post Note  Patient: JOZIE PADELFORD  Procedure(s) Performed: Procedure(s) (LRB): LAPAROSCOPIC CHOLECYSTECTOMY WITH INTRAOPERATIVE CHOLANGIOGRAM (N/A)  Patient location during evaluation: PACU Anesthesia Type: General Level of consciousness: awake and alert Pain management: pain level controlled Vital Signs Assessment: post-procedure vital signs reviewed and stable Respiratory status: spontaneous breathing, nonlabored ventilation, respiratory function stable and patient connected to nasal cannula oxygen Cardiovascular status: blood pressure returned to baseline and stable Postop Assessment: no signs of nausea or vomiting Anesthetic complications: no    Last Vitals:  Vitals:   10/17/15 1330 10/17/15 1358  BP: (!) 151/82 (!) 141/67  Pulse: 86 85  Resp: (!) 28 18  Temp: 36.8 C 36.6 C    Last Pain:  Vitals:   10/17/15 1427  TempSrc:   PainSc: 5                  Reino Kent

## 2015-10-17 NOTE — Progress Notes (Signed)
Subjective: Still with RUQ pain but less Nausea improved Denies jaundice  Objective: Vital signs in last 24 hours: Temp:  [98.7 F (37.1 C)-102 F (38.9 C)] 98.7 F (37.1 C) (07/25 0733) Pulse Rate:  [54-106] 93 (07/25 0733) Resp:  [17-24] 17 (07/25 0733) BP: (121-141)/(63-76) 134/76 (07/25 0733) SpO2:  [87 %-97 %] 93 % (07/25 0733) Weight:  [111.9 kg (246 lb 11.2 oz)] 111.9 kg (246 lb 11.2 oz) (07/25 0500)    Intake/Output from previous day: 07/24 0701 - 07/25 0700 In: 546.8 [I.V.:496.8; IV Piggyback:50] Out: 350 [Urine:350] Intake/Output this shift: Total I/O In: -  Out: 200 [Urine:200]  General appearance: alert, cooperative and no distress Eyes: conjunctivae/corneas clear. PERRL, EOM's intact. Fundi benign., no scleral icterus Resp: clear to auscultation bilaterally Cardio: regular rate and rhythm, S1, S2 normal, no murmur, click, rub or gallop GI: mildly distended; tender in RUQ - mild  Lab Results:   Recent Labs  10/16/15 0634 10/17/15 0456  WBC 10.0 7.4  HGB 12.8 10.3*  HCT 41.2 33.3*  PLT 188 164   BMET  Recent Labs  10/16/15 0634 10/17/15 0456  NA 138 137  K 3.4* 3.0*  CL 107 108  CO2 24 24  GLUCOSE 130* 128*  BUN <5* 6  CREATININE 0.89 0.75  CALCIUM 7.9* 7.6*   Hepatic Function Latest Ref Rng & Units 10/17/2015 10/16/2015 10/15/2015  Total Protein 6.5 - 8.1 g/dL 5.3(L) 6.0(L) 7.2  Albumin 3.5 - 5.0 g/dL 2.2(L) 2.7(L) 3.4(L)  AST 15 - 41 U/L 56(H) 107(H) 147(H)  ALT 14 - 54 U/L 55(H) 85(H) 106(H)  Alk Phosphatase 38 - 126 U/L 211(H) 274(H) 286(H)  Total Bilirubin 0.3 - 1.2 mg/dL 1.2 2.6(H) 1.6(H)  Bilirubin, Direct - - - -    PT/INR  Recent Labs  10/16/15 0201  LABPROT 16.3*  INR 1.30   ABG No results for input(s): PHART, HCO3 in the last 72 hours.  Invalid input(s): PCO2, PO2  Studies/Results: Dg Chest 2 View  Result Date: 10/15/2015 CLINICAL DATA:  Fever, shortness of breath and chest pain for 1 day. EXAM: CHEST  2 VIEW  COMPARISON:  CT chest and PA and lateral chest 01/30/2010. FINDINGS: The lungs are clear. Heart size is normal. No pneumothorax or pleural effusion. No focal bony abnormality. Surgical clips left upper quadrant of the abdomen are noted. IMPRESSION: No acute disease. Electronically Signed   By: Inge Rise M.D.   On: 10/15/2015 19:09  Ct Abdomen Pelvis W Contrast  Result Date: 10/15/2015 CLINICAL DATA:  Generalized abdominal pain and nausea. EXAM: CT ABDOMEN AND PELVIS WITH CONTRAST TECHNIQUE: Multidetector CT imaging of the abdomen and pelvis was performed using the standard protocol following bolus administration of intravenous contrast. CONTRAST:  165m ISOVUE-300 IOPAMIDOL (ISOVUE-300) INJECTION 61% COMPARISON:  None. FINDINGS: Lower chest: Limited visualization of the lower thorax demonstrates minimal dependent subpleural ground-glass atelectasis, right greater than left. No discrete focal airspace opacities. No pleural effusion. Normal heart size.  No pericardial effusion. Hepatobiliary: Normal hepatic contour. No discrete hepatic lesions. The gallbladder is distended with gallbladder wall thickening and minimal amount of associated pericholecystic fluid. These findings are associated with mild centralized intrahepatic biliary ductal dilatation and suspected periportal edema. No definite radiopaque gallstones. No ascites. Pancreas: Normal appearance of the pancreas. No pancreatic duct dilatation. No peripancreatic stranding. Spleen: Normal appearance of the spleen. No parent splenic stranding. Adrenals/Urinary Tract: There is symmetric enhancement and excretion of the bilateral kidneys. No definite renal stones this postcontrast examination. No discrete  renal lesions. No urinary obstruction or perinephric stranding. Normal appearance of the bilateral adrenal glands. Normal appearance of the urinary bladder given degree distention. Stomach/Bowel: Sequela of prior gastric bypass surgery. No evidence of  enteric obstruction Vascular/Lymphatic: Moderate amount of mixed calcified and noncalcified atherosclerotic plaque within a normal caliber abdominal aorta. The major branch vessels of the abdominal aorta appear patent on this non CTA examination. No bulky retroperitoneal, mesenteric, pelvic or inguinal lymphadenopathy on this noncontrast examination. Reproductive: Post hysterectomy. No discrete adnexal lesion. No free fluid in the pelvic cul-de-sac. Other: Regional soft tissues appear normal. Musculoskeletal: Post right total hip replacement. Note is made of an approximately 6.9 x 6.1 x 5.6 cm fluid collection about the anterior aspect of the right total hip prosthesis extending all the right iliacus musculature (representative axial image 86 and 72, series 21, coronal image 51, series 203). No discrete areas of osteolysis. IMPRESSION: 1. Findings worrisome for acute cholecystitis. Further evaluation nuclear medicine HIDA scan could be performed as clinically indicated. 2. Sequela of prior gastric bypass surgery without evidence of enteric obstruction. 3. Post right total hip replacement with a indeterminate fluid collection about the anterior aspect of the right total hip replacement extending to involve the right iliacus musculature. Clinical correlation is advised. 4. Sequela of prior gastric bypass surgery without evidence of enteric obstruction. Electronically Signed   By: Sandi Mariscal M.D.   On: 10/15/2015 22:53  Mr Abdomen Mrcp Wo Cm  Result Date: 10/16/2015 CLINICAL DATA:  Four-day history of worsening abdominal pain common nausea, vomiting and fever. Abnormal CT scan. EXAM: MRI ABDOMEN WITHOUT CONTRAST  (INCLUDING MRCP) TECHNIQUE: Multiplanar multisequence MR imaging of the abdomen was performed. Heavily T2-weighted images of the biliary and pancreatic ducts were obtained, and three-dimensional MRCP images were rendered by post processing. COMPARISON:  CT scan 10/15/2015 FINDINGS: Examination is quite  limited due to respiratory motion. In particular, the MRCP images are nondiagnostic. Lower chest: Bibasilar atelectasis. No pleural effusions. The heart is normal in size. No pericardial effusion. Hepatobiliary: Fatty infiltration of the liver is noted. No focal hepatic lesions. Moderate periportal edema is noted. No intrahepatic biliary dilatation. No common bile duct dilatation is identified. The gallbladder demonstrates a markedly thickened wall measuring up to 11 mm. There is layering debris dependently in the gallbladder which is probably a combination of sludge and tiny stones. Moderate pericholecystic inflammatory changes and fluid highly suggestive of acute cholecystitis. No obvious common bile duct stones. Pancreas: No mass, inflammation or ductal dilatation. Spleen: Normal size.  No focal lesions. Adrenals/Urinary Tract: The adrenal glands and kidneys are unremarkable. Stomach/Bowel: The stomach demonstrates surgical changes from bypass surgery. The visualized small bowel and colon are grossly normal. Vascular/Lymphatic: No mesenteric or retroperitoneal mass or adenopathy. The aorta is normal in caliber. Moderate distal atherosclerotic calcifications. Other: Small volume of fluid around the liver and in the right pericolic gutter. Musculoskeletal: No significant bony findings. IMPRESSION: 1. Very limited MR examination due to patient motion. The MRCP images are quite limited. 2. MR findings consistent with acute cholecystitis. No obvious common bile duct dilatation or common bile duct stones. 3. Normal appearance of the pancreas in normal caliber pancreatic duct. 4. 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 Electronically Signed   By: Marijo Sanes M.D.   On: 10/16/2015 20:35  Mr 3d Recon At Scanner  Result Date: 10/16/2015 CLINICAL DATA:  Four-day history of worsening abdominal pain common nausea, vomiting and fever. Abnormal CT scan. EXAM: MRI ABDOMEN WITHOUT CONTRAST  (INCLUDING MRCP)  TECHNIQUE:  Multiplanar multisequence MR imaging of the abdomen was performed. Heavily T2-weighted images of the biliary and pancreatic ducts were obtained, and three-dimensional MRCP images were rendered by post processing. COMPARISON:  CT scan 10/15/2015 FINDINGS: Examination is quite limited due to respiratory motion. In particular, the MRCP images are nondiagnostic. Lower chest: Bibasilar atelectasis. No pleural effusions. The heart is normal in size. No pericardial effusion. Hepatobiliary: Fatty infiltration of the liver is noted. No focal hepatic lesions. Moderate periportal edema is noted. No intrahepatic biliary dilatation. No common bile duct dilatation is identified. The gallbladder demonstrates a markedly thickened wall measuring up to 11 mm. There is layering debris dependently in the gallbladder which is probably a combination of sludge and tiny stones. Moderate pericholecystic inflammatory changes and fluid highly suggestive of acute cholecystitis. No obvious common bile duct stones. Pancreas: No mass, inflammation or ductal dilatation. Spleen: Normal size.  No focal lesions. Adrenals/Urinary Tract: The adrenal glands and kidneys are unremarkable. Stomach/Bowel: The stomach demonstrates surgical changes from bypass surgery. The visualized small bowel and colon are grossly normal. Vascular/Lymphatic: No mesenteric or retroperitoneal mass or adenopathy. The aorta is normal in caliber. Moderate distal atherosclerotic calcifications. Other: Small volume of fluid around the liver and in the right pericolic gutter. Musculoskeletal: No significant bony findings. IMPRESSION: 1. Very limited MR examination due to patient motion. The MRCP images are quite limited. 2. MR findings consistent with acute cholecystitis. No obvious common bile duct dilatation or common bile duct stones. 3. Normal appearance of the pancreas in normal caliber pancreatic duct. 4. 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 Electronically Signed    By: Marijo Sanes M.D.   On: 10/16/2015 20:35  US Abdomen Limited Ruq  Result Date: 10/15/2015 CLINICAL DATA:  65 year old female with generalized abdominal pain and fever. EXAM: US ABDOMEN LIMITED - RIGHT UPPER QUADRANT COMPARISON:  None. FINDINGS: Gallbladder: There is sludge within the gallbladder. No significant gallbladder wall thickening or pericholecystic fluid. There is slight indistinctness of the gallbladder wall which may be technical. Mild edema is not excluded. Negative sonographic Murphy's sign. Common bile duct: Diameter: 6 mm Liver: There is mild diffuse increased hepatic echogenicity likely fatty infiltration. IMPRESSION: Gallbladder sludge. No definite sonographic evidence of acute cholecystitis. A hepatobiliary scintigraphy may provide better evaluation of the gallbladder if an acute cholecystitis is clinically suspected. Fatty liver. Electronically Signed   By: Anner Crete M.D.   On: 10/15/2015 21:01   Anti-infectives: Anti-infectives    Start     Dose/Rate Route Frequency Ordered Stop   10/16/15 0400  piperacillin-tazobactam (ZOSYN) IVPB 3.375 g    Comments:  Zosyn 3.375 g IV q8h for CrCl > 20 mL/min   3.375 g 12.5 mL/hr over 240 Minutes Intravenous Every 8 hours 10/16/15 0105     10/15/15 2100  piperacillin-tazobactam (ZOSYN) IVPB 3.375 g     3.375 g 100 mL/hr over 30 Minutes Intravenous  Once 10/15/15 2056 10/15/15 2131      Assessment/Plan: Acute cholecystitis - LFTs better & MRCP w/o strong evid of CBD stone = lap chole with IOC 1st.  Dr Georgette Dover to do later today vs tomorrow  The anatomy & physiology of hepatobiliary & pancreatic function was discussed.  The pathophysiology of gallbladder dysfunction was discussed.  Natural history risks without surgery was discussed.   I feel the risks of no intervention will lead to serious problems that outweigh the operative risks; therefore, I recommended cholecystectomy to remove the pathology.  I explained laparoscopic  techniques with possible  need for an open approach.  Probable cholangiogram to evaluate the bilary tract was explained as well.    Risks such as bleeding, infection, abscess, leak, injury to other organs, need for repair of tissues / organs, need for further treatment, stroke, heart attack, death, and other risks were discussed.  I noted a good likelihood this will help address the problem.  Possibility that this will not correct all abdominal symptoms was explained.  Goals of post-operative recovery were discussed as well.  We will work to minimize complications.  An educational handout further explaining the pathology and treatment options was given as well.  Questions were answered.  The patient expresses understanding & wishes to proceed with surgery.    LOS: 1 day    GROSS,STEVEN C. 10/17/2015

## 2015-10-17 NOTE — Transfer of Care (Signed)
Immediate Anesthesia Transfer of Care Note  Patient: ALEESE ELIZONDO  Procedure(s) Performed: Procedure(s): LAPAROSCOPIC CHOLECYSTECTOMY WITH INTRAOPERATIVE CHOLANGIOGRAM (N/A)  Patient Location: PACU  Anesthesia Type:General  Level of Consciousness: awake, alert  and oriented  Airway & Oxygen Therapy: Patient Spontanous Breathing and Patient connected to nasal cannula oxygen  Post-op Assessment: Report given to RN, Post -op Vital signs reviewed and stable and Patient moving all extremities X 4  Post vital signs: Reviewed and stable  Last Vitals:  Vitals:   10/17/15 0310 10/17/15 0733  BP: (!) 141/76 134/76  Pulse: 97 93  Resp: 20 17  Temp: 37.8 C 37.1 C    Last Pain:  Vitals:   10/17/15 0928  TempSrc:   PainSc: 0-No pain      Patients Stated Pain Goal: 0 (10/17/15 0830)  Complications: No apparent anesthesia complications

## 2015-10-17 NOTE — Progress Notes (Signed)
Daily Rounding Note  10/17/2015, 9:48 AM  LOS: 1 day   SUBJECTIVE:   Chief complaint: abdominal pain.      Abdominal pain and nausea much improved  OBJECTIVE:         Vital signs in last 24 hours:    Temp:  [98.7 F (37.1 C)-102 F (38.9 C)] 98.7 F (37.1 C) (07/25 0733) Pulse Rate:  [54-105] 93 (07/25 0733) Resp:  [17-24] 17 (07/25 0733) BP: (121-141)/(63-76) 134/76 (07/25 0733) SpO2:  [87 %-96 %] 93 % (07/25 0733) Weight:  [111.9 kg (246 lb 11.2 oz)] 111.9 kg (246 lb 11.2 oz) (07/25 0500) Last BM Date: 10/16/15 Filed Weights   10/15/15 1821 10/16/15 0150 10/17/15 0500  Weight: 104.3 kg (230 lb) 109.5 kg (241 lb 4.8 oz) 111.9 kg (246 lb 11.2 oz)   General: comfortable, not ill looking.  Alert.    Heart: RRR Chest: no labored breathing Abdomen: did not examine  Extremities: no CCE Neuro/Psych:  Pleasant, calm.  Alert, oriented x 3.    Intake/Output from previous day: 07/24 0701 - 07/25 0700 In: 2111.8 [I.V.:2011.8; IV Piggyback:100] Out: 350 [Urine:350]  Intake/Output this shift: Total I/O In: 300 [I.V.:300] Out: 200 [Urine:200]  Lab Results:  Recent Labs  10/15/15 1832 10/16/15 0634 10/17/15 0456  WBC 13.2* 10.0 7.4  HGB 14.2 12.8 10.3*  HCT 43.6 41.2 33.3*  PLT 210 188 164   BMET  Recent Labs  10/15/15 1832 10/16/15 0634 10/17/15 0456  NA 133* 138 137  K 3.6 3.4* 3.0*  CL 99* 107 108  CO2 24 24 24   GLUCOSE 235* 130* 128*  BUN 9 <5* 6  CREATININE 0.88 0.89 0.75  CALCIUM 8.8* 7.9* 7.6*   LFT  Recent Labs  10/15/15 1832 10/16/15 0634 10/17/15 0456  PROT 7.2 6.0* 5.3*  ALBUMIN 3.4* 2.7* 2.2*  AST 147* 107* 56*  ALT 106* 85* 55*  ALKPHOS 286* 274* 211*  BILITOT 1.6* 2.6* 1.2   PT/INR  Recent Labs  10/16/15 0201  LABPROT 16.3*  INR 1.30   Hepatitis Panel No results for input(s): HEPBSAG, HCVAB, HEPAIGM, HEPBIGM in the last 72 hours.  Studies/Results: Dg Chest 2  View  Result Date: 10/15/2015 CLINICAL DATA:  Fever, shortness of breath and chest pain for 1 day. EXAM: CHEST  2 VIEW COMPARISON:  CT chest and PA and lateral chest 01/30/2010. FINDINGS: The lungs are clear. Heart size is normal. No pneumothorax or pleural effusion. No focal bony abnormality. Surgical clips left upper quadrant of the abdomen are noted. IMPRESSION: No acute disease. Electronically Signed   By: Drusilla Kanner M.D.   On: 10/15/2015 19:09  Ct Abdomen Pelvis W Contrast  Result Date: 10/15/2015 CLINICAL DATA:  Generalized abdominal pain and nausea. EXAM: CT ABDOMEN AND PELVIS WITH CONTRAST TECHNIQUE: Multidetector CT imaging of the abdomen and pelvis was performed using the standard protocol following bolus administration of intravenous contrast. CONTRAST:  ISOVUE-300 IOPAMIDOL (ISOVUE-300) INJECTION 61% COMPARISON:  None. FINDINGS: Lower chest: Limited visualization of the lower thorax demonstrates minimal dependent subpleural ground-glass atelectasis, right greater than left. No discrete focal airspace opacities. No pleural effusion. Normal heart size.  No pericardial effusion. Hepatobiliary: Normal hepatic contour. No discrete hepatic lesions. The gallbladder is distended with gallbladder wall thickening and minimal amount of associated pericholecystic fluid. These findings are associated with mild centralized intrahepatic biliary ductal dilatation and suspected periportal edema. No definite radiopaque gallstones. No ascites. Pancreas: Normal appearance of  the pancreas. No pancreatic duct dilatation. No peripancreatic stranding. Spleen: Normal appearance of the spleen. No parent splenic stranding. Adrenals/Urinary Tract: There is symmetric enhancement and excretion of the bilateral kidneys. No definite renal stones this postcontrast examination. No discrete renal lesions. No urinary obstruction or perinephric stranding. Normal appearance of the bilateral adrenal glands. Normal appearance  of the urinary bladder given degree distention. Stomach/Bowel: Sequela of prior gastric bypass surgery. No evidence of enteric obstruction Vascular/Lymphatic: Moderate amount of mixed calcified and noncalcified atherosclerotic plaque within a normal caliber abdominal aorta. The major branch vessels of the abdominal aorta appear patent on this non CTA examination. No bulky retroperitoneal, mesenteric, pelvic or inguinal lymphadenopathy on this noncontrast examination. Reproductive: Post hysterectomy. No discrete adnexal lesion. No free fluid in the pelvic cul-de-sac. Other: Regional soft tissues appear normal. Musculoskeletal: Post right total hip replacement. Note is made of an approximately 6.9 x 6.1 x 5.6 cm fluid collection about the anterior aspect of the right total hip prosthesis extending all the right iliacus musculature (representative axial image 86 and 72, series 21, coronal image 51, series 203). No discrete areas of osteolysis. IMPRESSION: 1. Findings worrisome for acute cholecystitis. Further evaluation nuclear medicine HIDA scan could be performed as clinically indicated. 2. Sequela of prior gastric bypass surgery without evidence of enteric obstruction. 3. Post right total hip replacement with a indeterminate fluid collection about the anterior aspect of the right total hip replacement extending to involve the right iliacus musculature. Clinical correlation is advised. 4. Sequela of prior gastric bypass surgery without evidence of enteric obstruction. Electronically Signed   By: Simonne Come M.D.   On: 10/15/2015 22:53  Mr Abdomen Mrcp Wo Cm  Result Date: 10/16/2015 CLINICAL DATA:  Four-day history of worsening abdominal pain common nausea, vomiting and fever. Abnormal CT scan. EXAM: MRI ABDOMEN WITHOUT CONTRAST  (INCLUDING MRCP) TECHNIQUE: Multiplanar multisequence MR imaging of the abdomen was performed. Heavily T2-weighted images of the biliary and pancreatic ducts were obtained, and  three-dimensional MRCP images were rendered by post processing. COMPARISON:  CT scan 10/15/2015 FINDINGS: Examination is quite limited due to respiratory motion. In particular, the MRCP images are nondiagnostic. Lower chest: Bibasilar atelectasis. No pleural effusions. The heart is normal in size. No pericardial effusion. Hepatobiliary: Fatty infiltration of the liver is noted. No focal hepatic lesions. Moderate periportal edema is noted. No intrahepatic biliary dilatation. No common bile duct dilatation is identified. The gallbladder demonstrates a markedly thickened wall measuring up to 11 mm. There is layering debris dependently in the gallbladder which is probably a combination of sludge and tiny stones. Moderate pericholecystic inflammatory changes and fluid highly suggestive of acute cholecystitis. No obvious common bile duct stones. Pancreas: No mass, inflammation or ductal dilatation. Spleen: Normal size.  No focal lesions. Adrenals/Urinary Tract: The adrenal glands and kidneys are unremarkable. Stomach/Bowel: The stomach demonstrates surgical changes from bypass surgery. The visualized small bowel and colon are grossly normal. Vascular/Lymphatic: No mesenteric or retroperitoneal mass or adenopathy. The aorta is normal in caliber. Moderate distal atherosclerotic calcifications. Other: Small volume of fluid around the liver and in the right pericolic gutter. Musculoskeletal: No significant bony findings. IMPRESSION: 1. Very limited MR examination due to patient motion. The MRCP images are quite limited. 2. MR findings consistent with acute cholecystitis. No obvious common bile duct dilatation or common bile duct stones. 3. Normal appearance of the pancreas in normal caliber pancreatic duct. 4. 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 Electronically Signed   By: Orlene Plum.D.  On: 10/16/2015 20:35  Mr 3d Recon At Scanner  Result Date: 10/16/2015 CLINICAL DATA:  Four-day history of worsening  abdominal pain common nausea, vomiting and fever. Abnormal CT scan. EXAM: MRI ABDOMEN WITHOUT CONTRAST  (INCLUDING MRCP) TECHNIQUE: Multiplanar multisequence MR imaging of the abdomen was performed. Heavily T2-weighted images of the biliary and pancreatic ducts were obtained, and three-dimensional MRCP images were rendered by post processing. COMPARISON:  CT scan 10/15/2015 FINDINGS: Examination is quite limited due to respiratory motion. In particular, the MRCP images are nondiagnostic. Lower chest: Bibasilar atelectasis. No pleural effusions. The heart is normal in size. No pericardial effusion. Hepatobiliary: Fatty infiltration of the liver is noted. No focal hepatic lesions. Moderate periportal edema is noted. No intrahepatic biliary dilatation. No common bile duct dilatation is identified. The gallbladder demonstrates a markedly thickened wall measuring up to 11 mm. There is layering debris dependently in the gallbladder which is probably a combination of sludge and tiny stones. Moderate pericholecystic inflammatory changes and fluid highly suggestive of acute cholecystitis. No obvious common bile duct stones. Pancreas: No mass, inflammation or ductal dilatation. Spleen: Normal size.  No focal lesions. Adrenals/Urinary Tract: The adrenal glands and kidneys are unremarkable. Stomach/Bowel: The stomach demonstrates surgical changes from bypass surgery. The visualized small bowel and colon are grossly normal. Vascular/Lymphatic: No mesenteric or retroperitoneal mass or adenopathy. The aorta is normal in caliber. Moderate distal atherosclerotic calcifications. Other: Small volume of fluid around the liver and in the right pericolic gutter. Musculoskeletal: No significant bony findings. IMPRESSION: 1. Very limited MR examination due to patient motion. The MRCP images are quite limited. 2. MR findings consistent with acute cholecystitis. No obvious common bile duct dilatation or common bile duct stones. 3. Normal  appearance of the pancreas in normal caliber pancreatic duct. 4. 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 Electronically Signed   By: Rudie Meyer M.D.   On: 10/16/2015 20:35  US Abdomen Limited Ruq  Result Date: 10/15/2015 CLINICAL DATA:  65 year old female with generalized abdominal pain and fever. EXAM: US ABDOMEN LIMITED - RIGHT UPPER QUADRANT COMPARISON:  None. FINDINGS: Gallbladder: There is sludge within the gallbladder. No significant gallbladder wall thickening or pericholecystic fluid. There is slight indistinctness of the gallbladder wall which may be technical. Mild edema is not excluded. Negative sonographic Murphy's sign. Common bile duct: Diameter: 6 mm Liver: There is mild diffuse increased hepatic echogenicity likely fatty infiltration. IMPRESSION: Gallbladder sludge. No definite sonographic evidence of acute cholecystitis. A hepatobiliary scintigraphy may provide better evaluation of the gallbladder if an acute cholecystitis is clinically suspected. Fatty liver. Electronically Signed   By: Elgie Collard M.D.   On: 10/15/2015 21:01  Scheduled Meds: . acetaminophen  1,000 mg Oral To OR  . Chlorhexidine Gluconate Cloth  6 each Topical Once  . DULoxetine  60 mg Oral Daily  . estradiol  0.5 mg Oral Daily  . gabapentin  300 mg Oral To OR  . heparin  5,000 Units Subcutaneous Q8H  . insulin aspart  0-9 Units Subcutaneous Q4H  . levothyroxine  88 mcg Oral QAC breakfast  . montelukast  10 mg Oral QHS  . pantoprazole (PROTONIX) IV  40 mg Intravenous Q12H  . piperacillin-tazobactam (ZOSYN)  IV  3.375 g Intravenous Q8H  . rosuvastatin  10 mg Oral QPM  . sodium chloride flush  3 mL Intravenous Q12H   Continuous Infusions: . dextrose 5 % and 0.9% NaCl 1,000 mL (10/17/15 0316)   PRN Meds:.acetaminophen, bisacodyl, dextrose, fentaNYL (SUBLIMAZE) injection, HYDROcodone-acetaminophen, methocarbamol, ondansetron **  OR** ondansetron (ZOFRAN) IV, polyethylene glycol   ASSESMENT:   *   Cholelithiasis.   *  Elevated LFTs.  No evidence of choledocholithiasis or biliary ductal dilatation on ultrasound, MRCP.  LFTs steadily improving.    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    *  Normocytic anemia, chronic.  Hgb as low as 8.7 in 06/2015 (FOBT negative then).  Marland Kitchen    PLAN   *  Per d/w Dr Rhea Belton, no plans to perform ERCP.  Surgeon plans lap chole with IOC later this AM. GI available for ERCP in future if indicated, but presently no indication for the procedure.   *  Switch to oral BID Protonix (her home dose)  *  Future GI follow up with Dr Mann/Hung.       Haley Larson  10/17/2015, 9:48 AM Pager: [phone]

## 2015-10-17 NOTE — Op Note (Signed)
Laparoscopic Cholecystectomy with IOC Procedure Note  Indications: This patient presents with symptomatic gallbladder disease and will undergo laparoscopic cholecystectomy.  Pre-operative Diagnosis: Calculus of gallbladder with acute cholecystitis, without mention of obstruction  Post-operative Diagnosis: Same  Surgeon: Kensy Blizard K.   Assistants: none  Anesthesia: General endotracheal anesthesia  ASA Class: 2E  Procedure Details  The patient was seen again in the Holding Room. The risks, benefits, complications, treatment options, and expected outcomes were discussed with the patient. The possibilities of reaction to medication, pulmonary aspiration, perforation of viscus, bleeding, recurrent infection, finding a normal gallbladder, the need for additional procedures, failure to diagnose a condition, the possible need to convert to an open procedure, and creating a complication requiring transfusion or operation were discussed with the patient. The likelihood of improving the patient's symptoms with return to their baseline status is good.  The patient and/or family concurred with the proposed plan, giving informed consent. The site of surgery properly noted. The patient was taken to Operating Room, identified as SHAUNITA SENEY and the procedure verified as Laparoscopic Cholecystectomy with Intraoperative Cholangiogram. A Time Out was held and the above information confirmed.  Prior to the induction of general anesthesia, antibiotic prophylaxis was administered. General endotracheal anesthesia was then administered and tolerated well. After the induction, the abdomen was prepped with Chloraprep and draped in the sterile fashion. The patient was positioned in the supine position.  Local anesthetic agent was injected into the skin near the umbilicus and an incision made.  She has a previous upper midline incision, but we were able to stay to the right of the original scar.   We dissected  down to the abdominal fascia with blunt dissection.  The fascia was incised vertically and we entered the peritoneal cavity bluntly.  There are adhesions to the left of the fascial opening, but none to the right.  A pursestring suture of 0-Vicryl was placed around the fascial opening.  The Hasson cannula was inserted and secured with the stay suture.  Pneumoperitoneum was then created with CO2 and tolerated well without any adverse changes in the patient's vital signs. An 11-mm port was placed in the subxiphoid position.  Two 5-mm ports were placed in the right upper quadrant. All skin incisions were infiltrated with a local anesthetic agent before making the incision and placing the trocars.   We positioned the patient in reverse Trendelenburg, tilted slightly to the patient's left.  The gallbladder was identified and was quite erythematous and distended with a thickened wall.  We peeled the omentum away from the gallbladder.  The fundus was grasped and retracted cephalad. Adhesions were lysed bluntly and with the electrocautery where indicated, taking care not to injure any adjacent organs or viscus. The infundibulum was grasped and retracted laterally, exposing the peritoneum overlying the triangle of Calot. This was then divided and exposed in a blunt fashion. A critical view of the cystic duct and cystic artery was obtained.  The cystic duct was clearly identified and bluntly dissected circumferentially. The cystic duct was ligated with a clip distally.   An incision was made in the cystic duct and the [facility] cholangiogram catheter introduced. The catheter was secured using a clip. A cholangiogram was then obtained which showed good visualization of the distal and proximal biliary tree with no sign of filling defects or obstruction.  Contrast flowed easily into the duodenum. The catheter was then removed.   The cystic duct was then ligated with clips and divided. The cystic artery was  identified, dissected  free, ligated with clips and divided as well.   The gallbladder was dissected from the liver bed in retrograde fashion with the electrocautery. There was significant oozing because of the inflamed gallbladder wall.  The gallbladder was removed and placed in an Endocatch sac. The liver bed was irrigated and inspected. Hemostasis was achieved with the electrocautery and Surgicel SNOW. Copious irrigation was utilized and was repeatedly aspirated until clear.  The gallbladder and Endocatch sac were then removed through the umbilical port site.  The pursestring suture was used to close the umbilical fascia.    We again inspected the right upper quadrant for hemostasis.  Pneumoperitoneum was released as we removed the trocars.  4-0 Monocryl was used to close the skin.   Benzoin, steri-strips, and clean dressings were applied. The patient was then extubated and brought to the recovery room in stable condition. Instrument, sponge, and needle counts were correct at closure and at the conclusion of the case.   Findings: Cholecystitis with Cholelithiasis  Estimated Blood Loss: less than 100 mL         Drains: none         Specimens: Gallbladder           Complications: None; patient tolerated the procedure well.         Disposition: PACU - hemodynamically stable.         Condition: stable  Wilmon Arms. Corliss Skains, MD, [facility]  General/ Trauma Surgery  10/17/2015 12:09 PM

## 2015-10-17 NOTE — Progress Notes (Signed)
Report given to robin roberts rn as caregiver 

## 2015-10-17 NOTE — Progress Notes (Signed)
[facility] PROGRESS NOTE  Haley Larson ZOX:[id number] DOB: [birth year] DOA: 10/15/2015   PCP: WHITE, Bonnell Public, NP     Assessment/Plan: Principal Problem:   Cholelithiasis with acute cholangitis Active Problems:   Hyperlipidemia   Status post THR (total hip replacement)   History of gastric bypass   Hypothyroidism   GERD (gastroesophageal reflux disease)   Hyponatremia   Hyperglycemia   Sepsis (HCC)   Acute cholangitis   Elective surgical procedure   65 y.o.femalewith medical history significant for morbid obesity status post open gastric bypass in the 1970s, arthritis with chronic pain, hypothyroidism, and hyperlipidemia who presents the emergency department for evaluation of 4 days of worsening abdominal pain, nausea, and vomiting with development of fever. Now has Acute cholecystitis - possible common bile duct obstruction with increasing bilirubin  Assessment and plan Cholelithiasis, acute cholecystitis, increasing LFTs, LFTs better & MRCP w/o strong evid of CBD stone = lap chole with intraoperative cholangiogram today Continue Zosyn As per Dr Rhea Belton, no plans to perform ERCP Future GI follow up with Dr Mann/Hung   Sepsis-improving, likely secondary to #1 No growth so far on urine culture, blood culture Continue Zosyn    Hyponatremia   - Serum sodium 133 on admission in setting of dehydration , resolved    3. GERD  - Stable, managed with Protonix BID at home - Continue Protonix q12h, converted to IV while NPO    4. Hypothyroidism  - Appears to be stable  - Resume Synthroid once appropriate for PO intake    5. Arthritis with chronic pain  - Stable, managed with APAP and prn Norco at home  - Currently controlling abd pain with fentanyl injections    6. Hyperglycemia  Hemoglobin A1c 5.5 - Serum glucose 235 on admission, possibly secondary to sepsis; no hx of DM  - Check CBG q4h for now; low-intensity SSI correctional prn      DVT  prophylaxsis Lovenox  Code Status:  Full code     Family Communication: Discussed in detail with the patient, all imaging results, lab results explained to the patient   Disposition Plan:   In the next 1-2 days      Consultants:  GI  Gen. surgery    Procedures:  Laparoscopic cholecystectomy on 7/25  Antibiotics: Anti-infectives    Start     Dose/Rate Route Frequency Ordered Stop   10/16/15 0400  [MAR Hold]  piperacillin-tazobactam (ZOSYN) IVPB 3.375 g     (MAR Hold since 10/17/15 1010)  Comments:  Zosyn 3.375 g IV q8h for CrCl > 20 mL/min   3.375 g 12.5 mL/hr over 240 Minutes Intravenous Every 8 hours 10/16/15 0105         HPI/Subjective: Chief complaint: abdominal pain.      Abdominal pain and nausea much improved  Objective: Vitals:   10/16/15 2300 10/17/15 0310 10/17/15 0500 10/17/15 0733  BP: 127/63 (!) 141/76  134/76  Pulse: 93 97  93  Resp: 18 20  17   Temp: 99.1 F (37.3 C) 100.1 F (37.8 C)  98.7 F (37.1 C)  TempSrc: Oral Oral  Oral  SpO2: 92% 96%  93%  Weight:   111.9 kg (246 lb 11.2 oz)   Height:        Intake/Output Summary (Last 24 hours) at 10/17/15 1102 Last data filed at 10/17/15 1000  Gross per 24 hour  Intake          2361.75 ml  Output  550 ml  Net          1811.75 ml    Exam:  Examination:  General exam: Appears calm and comfortable  Respiratory system: Clear to auscultation. Respiratory effort normal. Cardiovascular system: S1 & S2 heard, RRR. No JVD, murmurs, rubs, gallops or clicks. No pedal edema. Gastrointestinal system: Tender in the right upper quadrant. No organomegaly or masses felt. Normal bowel sounds heard. Central nervous system: Alert and oriented. No focal neurological deficits. Extremities: Symmetric 5 x 5 power. Skin: No rashes, lesions or ulcers Psychiatry: Judgement and insight appear normal. Mood & affect appropriate.     Data Reviewed: I have personally reviewed following labs and  imaging studies  Micro Results Recent Results (from the past 240 hour(s))  Culture, blood (Routine x 2)     Status: None (Preliminary result)   Collection Time: 10/15/15  6:32 PM  Result Value Ref Range Status   Specimen Description RIGHT ANTECUBITAL  Final   Special Requests BOTTLES DRAWN AEROBIC AND ANAEROBIC 10CC  Final   Culture NO GROWTH < 24 HOURS  Final   Report Status PENDING  Incomplete  Culture, blood (Routine x 2)     Status: None (Preliminary result)   Collection Time: 10/15/15  7:03 PM  Result Value Ref Range Status   Specimen Description RIGHT ANTECUBITAL  Final   Special Requests BOTTLES DRAWN AEROBIC AND ANAEROBIC  Final   Culture NO GROWTH < 24 HOURS  Final   Report Status PENDING  Incomplete  Urine culture     Status: None   Collection Time: 10/15/15  9:49 PM  Result Value Ref Range Status   Specimen Description URINE, CATHETERIZED  Final   Special Requests NONE  Final   Culture NO GROWTH  Final   Report Status 10/17/2015 FINAL  Final  Surgical PCR screen     Status: None   Collection Time: 10/16/15  2:00 AM  Result Value Ref Range Status   MRSA, PCR NEGATIVE NEGATIVE Final   Staphylococcus aureus NEGATIVE NEGATIVE Final    Comment:        The Xpert SA Assay (FDA approved for NASAL specimens in patients over 58 years of age), is one component of a comprehensive surveillance program.  Test performance has been validated by [facility] for patients greater than or equal to 10 year old. It is not intended to diagnose infection nor to guide or monitor treatment.     Radiology Reports Dg Chest 2 View  Result Date: 10/15/2015 CLINICAL DATA:  Fever, shortness of breath and chest pain for 1 day. EXAM: CHEST  2 VIEW COMPARISON:  CT chest and PA and lateral chest 01/30/2010. FINDINGS: The lungs are clear. Heart size is normal. No pneumothorax or pleural effusion. No focal bony abnormality. Surgical clips left upper quadrant of the abdomen are noted.  IMPRESSION: No acute disease. Electronically Signed   By: Drusilla Kanner M.D.   On: 10/15/2015 19:09  Ct Abdomen Pelvis W Contrast  Result Date: 10/15/2015 CLINICAL DATA:  Generalized abdominal pain and nausea. EXAM: CT ABDOMEN AND PELVIS WITH CONTRAST TECHNIQUE: Multidetector CT imaging of the abdomen and pelvis was performed using the standard protocol following bolus administration of intravenous contrast. CONTRAST:  ISOVUE-300 IOPAMIDOL (ISOVUE-300) INJECTION 61% COMPARISON:  None. FINDINGS: Lower chest: Limited visualization of the lower thorax demonstrates minimal dependent subpleural ground-glass atelectasis, right greater than left. No discrete focal airspace opacities. No pleural effusion. Normal heart size.  No pericardial effusion. Hepatobiliary: Normal hepatic  contour. No discrete hepatic lesions. The gallbladder is distended with gallbladder wall thickening and minimal amount of associated pericholecystic fluid. These findings are associated with mild centralized intrahepatic biliary ductal dilatation and suspected periportal edema. No definite radiopaque gallstones. No ascites. Pancreas: Normal appearance of the pancreas. No pancreatic duct dilatation. No peripancreatic stranding. Spleen: Normal appearance of the spleen. No parent splenic stranding. Adrenals/Urinary Tract: There is symmetric enhancement and excretion of the bilateral kidneys. No definite renal stones this postcontrast examination. No discrete renal lesions. No urinary obstruction or perinephric stranding. Normal appearance of the bilateral adrenal glands. Normal appearance of the urinary bladder given degree distention. Stomach/Bowel: Sequela of prior gastric bypass surgery. No evidence of enteric obstruction Vascular/Lymphatic: Moderate amount of mixed calcified and noncalcified atherosclerotic plaque within a normal caliber abdominal aorta. The major branch vessels of the abdominal aorta appear patent on this non CTA  examination. No bulky retroperitoneal, mesenteric, pelvic or inguinal lymphadenopathy on this noncontrast examination. Reproductive: Post hysterectomy. No discrete adnexal lesion. No free fluid in the pelvic cul-de-sac. Other: Regional soft tissues appear normal. Musculoskeletal: Post right total hip replacement. Note is made of an approximately 6.9 x 6.1 x 5.6 cm fluid collection about the anterior aspect of the right total hip prosthesis extending all the right iliacus musculature (representative axial image 86 and 72, series 21, coronal image 51, series 203). No discrete areas of osteolysis. IMPRESSION: 1. Findings worrisome for acute cholecystitis. Further evaluation nuclear medicine HIDA scan could be performed as clinically indicated. 2. Sequela of prior gastric bypass surgery without evidence of enteric obstruction. 3. Post right total hip replacement with a indeterminate fluid collection about the anterior aspect of the right total hip replacement extending to involve the right iliacus musculature. Clinical correlation is advised. 4. Sequela of prior gastric bypass surgery without evidence of enteric obstruction. Electronically Signed   By: Simonne Come M.D.   On: 10/15/2015 22:53  Mr Abdomen Mrcp Wo Cm  Result Date: 10/16/2015 CLINICAL DATA:  Four-day history of worsening abdominal pain common nausea, vomiting and fever. Abnormal CT scan. EXAM: MRI ABDOMEN WITHOUT CONTRAST  (INCLUDING MRCP) TECHNIQUE: Multiplanar multisequence MR imaging of the abdomen was performed. Heavily T2-weighted images of the biliary and pancreatic ducts were obtained, and three-dimensional MRCP images were rendered by post processing. COMPARISON:  CT scan 10/15/2015 FINDINGS: Examination is quite limited due to respiratory motion. In particular, the MRCP images are nondiagnostic. Lower chest: Bibasilar atelectasis. No pleural effusions. The heart is normal in size. No pericardial effusion. Hepatobiliary: Fatty infiltration of  the liver is noted. No focal hepatic lesions. Moderate periportal edema is noted. No intrahepatic biliary dilatation. No common bile duct dilatation is identified. The gallbladder demonstrates a markedly thickened wall measuring up to 11 mm. There is layering debris dependently in the gallbladder which is probably a combination of sludge and tiny stones. Moderate pericholecystic inflammatory changes and fluid highly suggestive of acute cholecystitis. No obvious common bile duct stones. Pancreas: No mass, inflammation or ductal dilatation. Spleen: Normal size.  No focal lesions. Adrenals/Urinary Tract: The adrenal glands and kidneys are unremarkable. Stomach/Bowel: The stomach demonstrates surgical changes from bypass surgery. The visualized small bowel and colon are grossly normal. Vascular/Lymphatic: No mesenteric or retroperitoneal mass or adenopathy. The aorta is normal in caliber. Moderate distal atherosclerotic calcifications. Other: Small volume of fluid around the liver and in the right pericolic gutter. Musculoskeletal: No significant bony findings. IMPRESSION: 1. Very limited MR examination due to patient motion. The MRCP images are quite limited.  2. MR findings consistent with acute cholecystitis. No obvious common bile duct dilatation or common bile duct stones. 3. Normal appearance of the pancreas in normal caliber pancreatic duct. 4. 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 Electronically Signed   By: Rudie Meyer M.D.   On: 10/16/2015 20:35  Mr 3d Recon At Scanner  Result Date: 10/16/2015 CLINICAL DATA:  Four-day history of worsening abdominal pain common nausea, vomiting and fever. Abnormal CT scan. EXAM: MRI ABDOMEN WITHOUT CONTRAST  (INCLUDING MRCP) TECHNIQUE: Multiplanar multisequence MR imaging of the abdomen was performed. Heavily T2-weighted images of the biliary and pancreatic ducts were obtained, and three-dimensional MRCP images were rendered by post processing. COMPARISON:  CT scan  10/15/2015 FINDINGS: Examination is quite limited due to respiratory motion. In particular, the MRCP images are nondiagnostic. Lower chest: Bibasilar atelectasis. No pleural effusions. The heart is normal in size. No pericardial effusion. Hepatobiliary: Fatty infiltration of the liver is noted. No focal hepatic lesions. Moderate periportal edema is noted. No intrahepatic biliary dilatation. No common bile duct dilatation is identified. The gallbladder demonstrates a markedly thickened wall measuring up to 11 mm. There is layering debris dependently in the gallbladder which is probably a combination of sludge and tiny stones. Moderate pericholecystic inflammatory changes and fluid highly suggestive of acute cholecystitis. No obvious common bile duct stones. Pancreas: No mass, inflammation or ductal dilatation. Spleen: Normal size.  No focal lesions. Adrenals/Urinary Tract: The adrenal glands and kidneys are unremarkable. Stomach/Bowel: The stomach demonstrates surgical changes from bypass surgery. The visualized small bowel and colon are grossly normal. Vascular/Lymphatic: No mesenteric or retroperitoneal mass or adenopathy. The aorta is normal in caliber. Moderate distal atherosclerotic calcifications. Other: Small volume of fluid around the liver and in the right pericolic gutter. Musculoskeletal: No significant bony findings. IMPRESSION: 1. Very limited MR examination due to patient motion. The MRCP images are quite limited. 2. MR findings consistent with acute cholecystitis. No obvious common bile duct dilatation or common bile duct stones. 3. Normal appearance of the pancreas in normal caliber pancreatic duct. 4. 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 Electronically Signed   By: Rudie Meyer M.D.   On: 10/16/2015 20:35  US Abdomen Limited Ruq  Result Date: 10/15/2015 CLINICAL DATA:  65 year old female with generalized abdominal pain and fever. EXAM: US ABDOMEN LIMITED - RIGHT UPPER QUADRANT  COMPARISON:  None. FINDINGS: Gallbladder: There is sludge within the gallbladder. No significant gallbladder wall thickening or pericholecystic fluid. There is slight indistinctness of the gallbladder wall which may be technical. Mild edema is not excluded. Negative sonographic Murphy's sign. Common bile duct: Diameter: 6 mm Liver: There is mild diffuse increased hepatic echogenicity likely fatty infiltration. IMPRESSION: Gallbladder sludge. No definite sonographic evidence of acute cholecystitis. A hepatobiliary scintigraphy may provide better evaluation of the gallbladder if an acute cholecystitis is clinically suspected. Fatty liver. Electronically Signed   By: Elgie Collard M.D.   On: 10/15/2015 21:01    CBC  Recent Labs Lab 10/15/15 1832 10/16/15 0634 10/17/15 0456  WBC 13.2* 10.0 7.4  HGB 14.2 12.8 10.3*  HCT 43.6 41.2 33.3*  PLT 210 188 164  MCV 92.8 93.2 92.2  MCH 30.2 29.0 28.5  MCHC 32.6 31.1 30.9  RDW 13.4 13.6 14.0  LYMPHSABS 0.6* 0.9  --   MONOABS 0.9 0.6  --   EOSABS 0.0 0.0  --   BASOSABS 0.0 0.0  --     Chemistries   Recent Labs Lab 10/15/15 1832 10/16/15 0634 10/17/15 0456  NA  133* 138 137  K 3.6 3.4* 3.0*  CL 99* 107 108  CO2 24 24 24   GLUCOSE 235* 130* 128*  BUN 9 <5* 6  CREATININE 0.88 0.89 0.75  CALCIUM 8.8* 7.9* 7.6*  AST 147* 107* 56*  ALT 106* 85* 55*  ALKPHOS 286* 274* 211*  BILITOT 1.6* 2.6* 1.2   ------------------------------------------------------------------------------------------------------------------ estimated creatinine clearance is 88.2 mL/min (by C-G formula based on SCr of 0.8 mg/dL). ------------------------------------------------------------------------------------------------------------------  Recent Labs  10/16/15 0201  HGBA1C 5.5   ------------------------------------------------------------------------------------------------------------------ No results for input(s): CHOL, HDL, LDLCALC, TRIG, CHOLHDL, LDLDIRECT  in the last 72 hours. ------------------------------------------------------------------------------------------------------------------ No results for input(s): TSH, T4TOTAL, T3FREE, THYROIDAB in the last 72 hours.  Invalid input(s): FREET3 ------------------------------------------------------------------------------------------------------------------ No results for input(s): VITAMINB12, FOLATE, FERRITIN, TIBC, IRON, RETICCTPCT in the last 72 hours.  Coagulation profile  Recent Labs Lab 10/16/15 0201  INR 1.30    No results for input(s): DDIMER in the last 72 hours.  Cardiac Enzymes No results for input(s): CKMB, TROPONINI, MYOGLOBIN in the last 168 hours.  Invalid input(s): CK ------------------------------------------------------------------------------------------------------------------ Invalid input(s): POCBNP   CBG:  Recent Labs Lab 10/17/15 0227 10/17/15 0407 10/17/15 0555 10/17/15 0732 10/17/15 0953  GLUCAP 109* 106* 130* 111* 120*       Studies: Dg Chest 2 View  Result Date: 10/15/2015 CLINICAL DATA:  Fever, shortness of breath and chest pain for 1 day. EXAM: CHEST  2 VIEW COMPARISON:  CT chest and PA and lateral chest 01/30/2010. FINDINGS: The lungs are clear. Heart size is normal. No pneumothorax or pleural effusion. No focal bony abnormality. Surgical clips left upper quadrant of the abdomen are noted. IMPRESSION: No acute disease. Electronically Signed   By: Drusilla Kanner M.D.   On: 10/15/2015 19:09  Ct Abdomen Pelvis W Contrast  Result Date: 10/15/2015 CLINICAL DATA:  Generalized abdominal pain and nausea. EXAM: CT ABDOMEN AND PELVIS WITH CONTRAST TECHNIQUE: Multidetector CT imaging of the abdomen and pelvis was performed using the standard protocol following bolus administration of intravenous contrast. CONTRAST:  ISOVUE-300 IOPAMIDOL (ISOVUE-300) INJECTION 61% COMPARISON:  None. FINDINGS: Lower chest: Limited visualization of the lower  thorax demonstrates minimal dependent subpleural ground-glass atelectasis, right greater than left. No discrete focal airspace opacities. No pleural effusion. Normal heart size.  No pericardial effusion. Hepatobiliary: Normal hepatic contour. No discrete hepatic lesions. The gallbladder is distended with gallbladder wall thickening and minimal amount of associated pericholecystic fluid. These findings are associated with mild centralized intrahepatic biliary ductal dilatation and suspected periportal edema. No definite radiopaque gallstones. No ascites. Pancreas: Normal appearance of the pancreas. No pancreatic duct dilatation. No peripancreatic stranding. Spleen: Normal appearance of the spleen. No parent splenic stranding. Adrenals/Urinary Tract: There is symmetric enhancement and excretion of the bilateral kidneys. No definite renal stones this postcontrast examination. No discrete renal lesions. No urinary obstruction or perinephric stranding. Normal appearance of the bilateral adrenal glands. Normal appearance of the urinary bladder given degree distention. Stomach/Bowel: Sequela of prior gastric bypass surgery. No evidence of enteric obstruction Vascular/Lymphatic: Moderate amount of mixed calcified and noncalcified atherosclerotic plaque within a normal caliber abdominal aorta. The major branch vessels of the abdominal aorta appear patent on this non CTA examination. No bulky retroperitoneal, mesenteric, pelvic or inguinal lymphadenopathy on this noncontrast examination. Reproductive: Post hysterectomy. No discrete adnexal lesion. No free fluid in the pelvic cul-de-sac. Other: Regional soft tissues appear normal. Musculoskeletal: Post right total hip replacement. Note is made of an approximately 6.9 x 6.1 x 5.6 cm fluid collection about  the anterior aspect of the right total hip prosthesis extending all the right iliacus musculature (representative axial image 86 and 72, series 21, coronal image 51, series  203). No discrete areas of osteolysis. IMPRESSION: 1. Findings worrisome for acute cholecystitis. Further evaluation nuclear medicine HIDA scan could be performed as clinically indicated. 2. Sequela of prior gastric bypass surgery without evidence of enteric obstruction. 3. Post right total hip replacement with a indeterminate fluid collection about the anterior aspect of the right total hip replacement extending to involve the right iliacus musculature. Clinical correlation is advised. 4. Sequela of prior gastric bypass surgery without evidence of enteric obstruction. Electronically Signed   By: Simonne Come M.D.   On: 10/15/2015 22:53  Mr Abdomen Mrcp Wo Cm  Result Date: 10/16/2015 CLINICAL DATA:  Four-day history of worsening abdominal pain common nausea, vomiting and fever. Abnormal CT scan. EXAM: MRI ABDOMEN WITHOUT CONTRAST  (INCLUDING MRCP) TECHNIQUE: Multiplanar multisequence MR imaging of the abdomen was performed. Heavily T2-weighted images of the biliary and pancreatic ducts were obtained, and three-dimensional MRCP images were rendered by post processing. COMPARISON:  CT scan 10/15/2015 FINDINGS: Examination is quite limited due to respiratory motion. In particular, the MRCP images are nondiagnostic. Lower chest: Bibasilar atelectasis. No pleural effusions. The heart is normal in size. No pericardial effusion. Hepatobiliary: Fatty infiltration of the liver is noted. No focal hepatic lesions. Moderate periportal edema is noted. No intrahepatic biliary dilatation. No common bile duct dilatation is identified. The gallbladder demonstrates a markedly thickened wall measuring up to 11 mm. There is layering debris dependently in the gallbladder which is probably a combination of sludge and tiny stones. Moderate pericholecystic inflammatory changes and fluid highly suggestive of acute cholecystitis. No obvious common bile duct stones. Pancreas: No mass, inflammation or ductal dilatation. Spleen: Normal size.   No focal lesions. Adrenals/Urinary Tract: The adrenal glands and kidneys are unremarkable. Stomach/Bowel: The stomach demonstrates surgical changes from bypass surgery. The visualized small bowel and colon are grossly normal. Vascular/Lymphatic: No mesenteric or retroperitoneal mass or adenopathy. The aorta is normal in caliber. Moderate distal atherosclerotic calcifications. Other: Small volume of fluid around the liver and in the right pericolic gutter. Musculoskeletal: No significant bony findings. IMPRESSION: 1. Very limited MR examination due to patient motion. The MRCP images are quite limited. 2. MR findings consistent with acute cholecystitis. No obvious common bile duct dilatation or common bile duct stones. 3. Normal appearance of the pancreas in normal caliber pancreatic duct. 4. 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 Electronically Signed   By: Rudie Meyer M.D.   On: 10/16/2015 20:35  Mr 3d Recon At Scanner  Result Date: 10/16/2015 CLINICAL DATA:  Four-day history of worsening abdominal pain common nausea, vomiting and fever. Abnormal CT scan. EXAM: MRI ABDOMEN WITHOUT CONTRAST  (INCLUDING MRCP) TECHNIQUE: Multiplanar multisequence MR imaging of the abdomen was performed. Heavily T2-weighted images of the biliary and pancreatic ducts were obtained, and three-dimensional MRCP images were rendered by post processing. COMPARISON:  CT scan 10/15/2015 FINDINGS: Examination is quite limited due to respiratory motion. In particular, the MRCP images are nondiagnostic. Lower chest: Bibasilar atelectasis. No pleural effusions. The heart is normal in size. No pericardial effusion. Hepatobiliary: Fatty infiltration of the liver is noted. No focal hepatic lesions. Moderate periportal edema is noted. No intrahepatic biliary dilatation. No common bile duct dilatation is identified. The gallbladder demonstrates a markedly thickened wall measuring up to 11 mm. There is layering debris dependently in the  gallbladder which is probably a  combination of sludge and tiny stones. Moderate pericholecystic inflammatory changes and fluid highly suggestive of acute cholecystitis. No obvious common bile duct stones. Pancreas: No mass, inflammation or ductal dilatation. Spleen: Normal size.  No focal lesions. Adrenals/Urinary Tract: The adrenal glands and kidneys are unremarkable. Stomach/Bowel: The stomach demonstrates surgical changes from bypass surgery. The visualized small bowel and colon are grossly normal. Vascular/Lymphatic: No mesenteric or retroperitoneal mass or adenopathy. The aorta is normal in caliber. Moderate distal atherosclerotic calcifications. Other: Small volume of fluid around the liver and in the right pericolic gutter. Musculoskeletal: No significant bony findings. IMPRESSION: 1. Very limited MR examination due to patient motion. The MRCP images are quite limited. 2. MR findings consistent with acute cholecystitis. No obvious common bile duct dilatation or common bile duct stones. 3. Normal appearance of the pancreas in normal caliber pancreatic duct. 4. 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 Electronically Signed   By: Rudie Meyer M.D.   On: 10/16/2015 20:35  US Abdomen Limited Ruq  Result Date: 10/15/2015 CLINICAL DATA:  65 year old female with generalized abdominal pain and fever. EXAM: US ABDOMEN LIMITED - RIGHT UPPER QUADRANT COMPARISON:  None. FINDINGS: Gallbladder: There is sludge within the gallbladder. No significant gallbladder wall thickening or pericholecystic fluid. There is slight indistinctness of the gallbladder wall which may be technical. Mild edema is not excluded. Negative sonographic Murphy's sign. Common bile duct: Diameter: 6 mm Liver: There is mild diffuse increased hepatic echogenicity likely fatty infiltration. IMPRESSION: Gallbladder sludge. No definite sonographic evidence of acute cholecystitis. A hepatobiliary scintigraphy may provide better evaluation of the  gallbladder if an acute cholecystitis is clinically suspected. Fatty liver. Electronically Signed   By: Elgie Collard M.D.   On: 10/15/2015 21:01     Lab Results  Component Value Date   HGBA1C 5.5 10/16/2015   Lab Results  Component Value Date   CREATININE 0.75 10/17/2015       Scheduled Meds: . acetaminophen  1,000 mg Oral To OR  . [MAR Hold] DULoxetine  60 mg Oral Daily  . [MAR Hold] estradiol  0.5 mg Oral Daily  . gabapentin  300 mg Oral To OR  . [MAR Hold] heparin  5,000 Units Subcutaneous Q8H  . [MAR Hold] insulin aspart  0-9 Units Subcutaneous Q4H  . [MAR Hold] levothyroxine  88 mcg Oral QAC breakfast  . [MAR Hold] montelukast  10 mg Oral QHS  . [MAR Hold] pantoprazole  40 mg Oral BID  . [MAR Hold] piperacillin-tazobactam (ZOSYN)  IV  3.375 g Intravenous Q8H  . [MAR Hold] rosuvastatin  10 mg Oral QPM  . [MAR Hold] sodium chloride flush  3 mL Intravenous Q12H   Continuous Infusions: . dextrose 5 % and 0.9% NaCl 1,000 mL (10/17/15 0316)     LOS: 1 day    Time spent: >30 MINS    [facility]  [facility] Pager [phone]. If 7PM-7AM, please contact night-coverage at [email or web address], password [facility] 10/17/2015, 11:02 AM  LOS: 1 day

## 2015-10-18 ENCOUNTER — Encounter (HOSPITAL_COMMUNITY): Payer: Self-pay | Admitting: Surgery

## 2015-10-18 DIAGNOSIS — E876 Hypokalemia: Secondary | ICD-10-CM

## 2015-10-18 LAB — COMPREHENSIVE METABOLIC PANEL
ALBUMIN: 2.2 g/dL — AB (ref 3.5–5.0)
ALT: 48 U/L (ref 14–54)
ANION GAP: 7 (ref 5–15)
AST: 57 U/L — ABNORMAL HIGH (ref 15–41)
Alkaline Phosphatase: 197 U/L — ABNORMAL HIGH (ref 38–126)
BUN: <5 mg/dL — ABNORMAL LOW (ref 6–20)
CALCIUM: 8 mg/dL — AB (ref 8.9–10.3)
CHLORIDE: 104 mmol/L (ref 101–111)
CO2: 25 mmol/L (ref 22–32)
Creatinine, Ser: 0.83 mg/dL (ref 0.44–1.00)
GFR calc non Af Amer: >60 mL/min (ref >60)
GLUCOSE: 131 mg/dL — AB (ref 65–99)
POTASSIUM: 2.9 mmol/L — AB (ref 3.5–5.1)
SODIUM: 136 mmol/L (ref 135–145)
Total Bilirubin: 1.1 mg/dL (ref 0.3–1.2)
Total Protein: 5.3 g/dL — ABNORMAL LOW (ref 6.5–8.1)

## 2015-10-18 LAB — CBC
HCT: 37 % (ref 36.0–46.0)
HEMOGLOBIN: 11.5 g/dL — AB (ref 12.0–15.0)
MCH: 28.8 pg (ref 26.0–34.0)
MCHC: 31.1 g/dL (ref 30.0–36.0)
MCV: 92.5 fL (ref 78.0–100.0)
PLATELETS: 200 10*3/uL (ref 150–400)
RBC: 4 MIL/uL (ref 3.87–5.11)
RDW: 14.1 % (ref 11.5–15.5)
WBC: 9.6 10*3/uL (ref 4.0–10.5)

## 2015-10-18 LAB — GLUCOSE, CAPILLARY
GLUCOSE-CAPILLARY: 126 mg/dL — AB (ref 65–99)
GLUCOSE-CAPILLARY: 132 mg/dL — AB (ref 65–99)
GLUCOSE-CAPILLARY: 165 mg/dL — AB (ref 65–99)
Glucose-Capillary: 111 mg/dL — ABNORMAL HIGH (ref 65–99)
Glucose-Capillary: 133 mg/dL — ABNORMAL HIGH (ref 65–99)
Glucose-Capillary: 137 mg/dL — ABNORMAL HIGH (ref 65–99)

## 2015-10-18 LAB — LIPASE, BLOOD: LIPASE: 12 U/L (ref 11–51)

## 2015-10-18 LAB — MAGNESIUM: MAGNESIUM: 1.7 mg/dL (ref 1.7–2.4)

## 2015-10-18 MED ORDER — ALUM & MAG HYDROXIDE-SIMETH 200-200-20 MG/5ML PO SUSP
30.0000 mL | Freq: Four times a day (QID) | ORAL | Status: DC | PRN
  Administered 2015-10-18 – 2015-10-20 (×3): 30 mL via ORAL
  Filled 2015-10-18 (×3): qty 30

## 2015-10-18 MED ORDER — BLISTEX MEDICATED EX OINT
TOPICAL_OINTMENT | Freq: Two times a day (BID) | CUTANEOUS | Status: DC
  Administered 2015-10-18 – 2015-10-24 (×10): via TOPICAL
  Filled 2015-10-18: qty 6.3

## 2015-10-18 MED ORDER — METHOCARBAMOL 1000 MG/10ML IJ SOLN
1000.0000 mg | Freq: Four times a day (QID) | INTRAVENOUS | Status: DC | PRN
  Filled 2015-10-18 (×2): qty 10

## 2015-10-18 MED ORDER — POTASSIUM CHLORIDE CRYS ER 20 MEQ PO TBCR
40.0000 meq | EXTENDED_RELEASE_TABLET | Freq: Two times a day (BID) | ORAL | Status: AC
  Administered 2015-10-18 – 2015-10-20 (×5): 40 meq via ORAL
  Filled 2015-10-18 (×5): qty 2

## 2015-10-18 MED ORDER — MAGNESIUM SULFATE 2 GM/50ML IV SOLN
2.0000 g | Freq: Once | INTRAVENOUS | Status: AC
  Administered 2015-10-18: 2 g via INTRAVENOUS
  Filled 2015-10-18: qty 50

## 2015-10-18 MED ORDER — MAGIC MOUTHWASH: 15.0000 mL | Freq: Four times a day (QID) | ORAL | Status: DC | PRN

## 2015-10-18 MED ORDER — ACETAMINOPHEN 500 MG PO TABS
1000.0000 mg | ORAL_TABLET | Freq: Three times a day (TID) | ORAL | Status: DC
  Administered 2015-10-18 – 2015-10-24 (×14): 1000 mg via ORAL
  Filled 2015-10-18 (×16): qty 2

## 2015-10-18 MED ORDER — OXYCODONE HCL 5 MG PO TABS
5.0000 mg | ORAL_TABLET | ORAL | Status: DC | PRN
  Administered 2015-10-18 (×2): 10 mg via ORAL
  Filled 2015-10-18 (×2): qty 2

## 2015-10-18 MED ORDER — POTASSIUM CHLORIDE 10 MEQ/100ML IV SOLN
10.0000 meq | INTRAVENOUS | Status: AC
  Administered 2015-10-18 (×4): 10 meq via INTRAVENOUS
  Filled 2015-10-18: qty 100

## 2015-10-18 MED ORDER — LIP MEDEX EX OINT
1.0000 "application " | TOPICAL_OINTMENT | Freq: Two times a day (BID) | CUTANEOUS | Status: DC
  Filled 2015-10-18: qty 7

## 2015-10-18 MED ORDER — FENTANYL CITRATE (PF) 100 MCG/2ML IJ SOLN: 25.0000 ug | INTRAMUSCULAR | Status: DC | PRN

## 2015-10-18 MED ORDER — METOCLOPRAMIDE HCL 5 MG/ML IJ SOLN
5.0000 mg | Freq: Four times a day (QID) | INTRAMUSCULAR | Status: DC | PRN
  Administered 2015-10-18 – 2015-10-19 (×2): 10 mg via INTRAVENOUS
  Filled 2015-10-18 (×3): qty 2

## 2015-10-18 MED ORDER — PHENOL 1.4 % MT LIQD: 2.0000 | OROMUCOSAL | Status: DC | PRN

## 2015-10-18 MED ORDER — PROCHLORPERAZINE EDISYLATE 5 MG/ML IJ SOLN
5.0000 mg | INTRAMUSCULAR | Status: DC | PRN
  Filled 2015-10-18 (×2): qty 2

## 2015-10-18 MED ORDER — LACTATED RINGERS IV BOLUS (SEPSIS)
1000.0000 mL | Freq: Once | INTRAVENOUS | Status: AC
  Administered 2015-10-18: 1000 mL via INTRAVENOUS

## 2015-10-18 MED ORDER — HYDROCODONE-ACETAMINOPHEN 5-325 MG PO TABS
1.0000 | ORAL_TABLET | ORAL | Status: DC | PRN
  Administered 2015-10-18: 2 via ORAL
  Administered 2015-10-19: 1 via ORAL
  Administered 2015-10-19: 2 via ORAL
  Administered 2015-10-19 – 2015-10-20 (×2): 1 via ORAL
  Filled 2015-10-18 (×3): qty 1
  Filled 2015-10-18 (×2): qty 2
  Filled 2015-10-18 (×2): qty 1
  Filled 2015-10-18: qty 2

## 2015-10-18 MED ORDER — LACTATED RINGERS IV BOLUS (SEPSIS): 1000.0000 mL | Freq: Three times a day (TID) | INTRAVENOUS | Status: AC | PRN

## 2015-10-18 MED ORDER — HYDROMORPHONE HCL 1 MG/ML IJ SOLN: 0.5000 mg | INTRAMUSCULAR | Status: DC | PRN

## 2015-10-18 MED ORDER — MENTHOL 3 MG MT LOZG: 1.0000 | LOZENGE | OROMUCOSAL | Status: DC | PRN

## 2015-10-18 MED ORDER — POTASSIUM CHLORIDE IN NACL 40-0.9 MEQ/L-% IV SOLN
INTRAVENOUS | Status: DC
  Administered 2015-10-18 – 2015-10-21 (×4): 75 mL/h via INTRAVENOUS
  Administered 2015-10-22 (×2): 100 mL/h via INTRAVENOUS
  Filled 2015-10-18 (×9): qty 1000

## 2015-10-18 MED ORDER — POTASSIUM CHLORIDE 10 MEQ/100ML IV SOLN: 10.0000 meq | INTRAVENOUS | Status: DC

## 2015-10-18 MED ORDER — BISACODYL 10 MG RE SUPP: 10.0000 mg | Freq: Two times a day (BID) | RECTAL | Status: DC | PRN

## 2015-10-18 NOTE — Progress Notes (Signed)
[facility] PROGRESS NOTE  Haley Larson ZOX:[id number] DOB: 1951/02/21 DOA: 10/15/2015   PCP: April Manson, NP     Assessment/Plan: Principal Problem:   Acute cholecystitis s/p lap chole 10/17/2015 Active Problems:   Hyperlipidemia   Status post THR (total hip replacement)   History of gastric bypass   Hypothyroidism   GERD (gastroesophageal reflux disease)   Hyponatremia   Hyperglycemia   Sepsis (HCC)   Acute cholangitis   Elective surgical procedure   Elevated LFTs   Hypokalemia   64 y.o.femalewith medical history significant for morbid obesity status post open gastric bypass in the 1970s, arthritis with chronic pain, hypothyroidism, and hyperlipidemia who presents the emergency department for evaluation of 4 days of worsening abdominal pain, nausea, and vomiting with development of fever. Now has Acute cholecystitis - possible common bile duct obstruction with increasing bilirubin  Assessment and plan Cholelithiasis, acute cholecystitis, increasing LFTs, LFTs better & MRCP w/o strong evid of CBD stone = lap chole with intraoperative cholangiogram 7/25 Continue Zosyn As per Dr Rhea Belton, no plans to perform ERCP Nauseous after surgery not tolerating full liquids  Future GI follow up with Dr Mann/Hung  hypokalemia  Replete   Sepsis-improving, likely secondary to #1 No growth so far on urine culture, blood culture Continue Zosyn    Hyponatremia   - Serum sodium 133 on admission in setting of dehydration , resolved    3. GERD  - Stable, managed with Protonix BID at home - Continue Protonix q12h, converted to IV while NPO    4. Hypothyroidism  - Appears to be stable  - Resume Synthroid once appropriate for PO intake    5. Arthritis with chronic pain  - Stable, managed with APAP and prn Norco at home  - Currently controlling abd pain with fentanyl injections    6. Hyperglycemia  Hemoglobin A1c 5.5 - Serum glucose 235 on admission,  possibly secondary to sepsis; no hx of DM  - Check CBG q4h for now; low-intensity SSI correctional prn      DVT prophylaxsis Lovenox  Code Status:  Full code     Family Communication: Discussed in detail with the patient, all imaging results, lab results explained to the patient   Disposition Plan:   In the next 1-2 days      Consultants:  GI  Gen. surgery    Procedures:  Laparoscopic cholecystectomy on 7/25  Antibiotics: Anti-infectives    Start     Dose/Rate Route Frequency Ordered Stop   10/16/15 0400  [MAR Hold]  piperacillin-tazobactam (ZOSYN) IVPB 3.375 g     (MAR Hold since 10/17/15 1010)  Comments:  Zosyn 3.375 g IV q8h for CrCl > 20 mL/min   3.375 g 12.5 mL/hr over 240 Minutes Intravenous Every 8 hours 10/16/15 0105         HPI/Subjective: Chief complaint: abdominal pain.      Nauseous after surgery,not tolerating diet  Objective: Vitals:   10/18/15 0555 10/18/15 0755 10/18/15 1014 10/18/15 1432  BP: (!) 109/51 (!) 118/56 (!) 106/54 116/65  Pulse: 97 94 95 94  Resp: 19 18 18 18   Temp: 98.6 F (37 C) 99.1 F (37.3 C) 97.9 F (36.6 C) 98.4 F (36.9 C)  TempSrc: Oral Oral Oral Oral  SpO2: 95% 96% 95% 94%  Weight:      Height:        Intake/Output Summary (Last 24 hours) at 10/18/15 1512 Last data filed at 10/18/15 1357  Gross per 24 hour  Intake             1810 ml  Output                0 ml  Net             1810 ml    Exam:  Examination:  General exam: Appears calm and comfortable  Respiratory system: Clear to auscultation. Respiratory effort normal. Cardiovascular system: S1 & S2 heard, RRR. No JVD, murmurs, rubs, gallops or clicks. No pedal edema. Gastrointestinal system: Tender in the right upper quadrant. No organomegaly or masses felt. Normal bowel sounds heard. Central nervous system: Alert and oriented. No focal neurological deficits. Extremities: Symmetric 5 x 5 power. Skin: No rashes, lesions or ulcers Psychiatry:  Judgement and insight appear normal. Mood & affect appropriate.     Data Reviewed: I have personally reviewed following labs and imaging studies  Micro Results Recent Results (from the past 240 hour(s))  Culture, blood (Routine x 2)     Status: None (Preliminary result)   Collection Time: 10/15/15  6:32 PM  Result Value Ref Range Status   Specimen Description RIGHT ANTECUBITAL  Final   Special Requests BOTTLES DRAWN AEROBIC AND ANAEROBIC 10CC  Final   Culture NO GROWTH 2 DAYS  Final   Report Status PENDING  Incomplete  Culture, blood (Routine x 2)     Status: None (Preliminary result)   Collection Time: 10/15/15  7:03 PM  Result Value Ref Range Status   Specimen Description RIGHT ANTECUBITAL  Final   Special Requests BOTTLES DRAWN AEROBIC AND ANAEROBIC  Final   Culture NO GROWTH 2 DAYS  Final   Report Status PENDING  Incomplete  Urine culture     Status: None   Collection Time: 10/15/15  9:49 PM  Result Value Ref Range Status   Specimen Description URINE, CATHETERIZED  Final   Special Requests NONE  Final   Culture NO GROWTH  Final   Report Status 10/17/2015 FINAL  Final  Surgical PCR screen     Status: None   Collection Time: 10/16/15  2:00 AM  Result Value Ref Range Status   MRSA, PCR NEGATIVE NEGATIVE Final   Staphylococcus aureus NEGATIVE NEGATIVE Final    Comment:        The Xpert SA Assay (FDA approved for NASAL specimens in patients over 64 years of age), is one component of a comprehensive surveillance program.  Test performance has been validated by [facility] for patients greater than or equal to 62 year old. It is not intended to diagnose infection nor to guide or monitor treatment.     Radiology Reports Dg Chest 2 View  Result Date: 10/15/2015 CLINICAL DATA:  Fever, shortness of breath and chest pain for 1 day. EXAM: CHEST  2 VIEW COMPARISON:  CT chest and PA and lateral chest 01/30/2010. FINDINGS: The lungs are clear. Heart size is normal. No  pneumothorax or pleural effusion. No focal bony abnormality. Surgical clips left upper quadrant of the abdomen are noted. IMPRESSION: No acute disease. Electronically Signed   By: Drusilla Kanner M.D.   On: 10/15/2015 19:09  Dg Cholangiogram Operative  Result Date: 10/17/2015 CLINICAL DATA:  Acute cholecystitis EXAM: INTRAOPERATIVE CHOLANGIOGRAM TECHNIQUE: Cholangiographic images from the C-arm fluoroscopic device were submitted for interpretation post-operatively. Please see the procedural report for the amount of contrast and the fluoroscopy time utilized. COMPARISON:  10/16/2015 FINDINGS: The biliary confluence, common hepatic duct, residual cystic  duct, and common bile duct are all patent. Contrast drains into the duodenum. No filling defect or obstruction. IMPRESSION: Patent biliary system. Electronically Signed   By: Judie Petit.  Shick M.D.   On: 10/17/2015 11:37  Ct Abdomen Pelvis W Contrast  Result Date: 10/15/2015 CLINICAL DATA:  Generalized abdominal pain and nausea. EXAM: CT ABDOMEN AND PELVIS WITH CONTRAST TECHNIQUE: Multidetector CT imaging of the abdomen and pelvis was performed using the standard protocol following bolus administration of intravenous contrast. CONTRAST:  ISOVUE-300 IOPAMIDOL (ISOVUE-300) INJECTION 61% COMPARISON:  None. FINDINGS: Lower chest: Limited visualization of the lower thorax demonstrates minimal dependent subpleural ground-glass atelectasis, right greater than left. No discrete focal airspace opacities. No pleural effusion. Normal heart size.  No pericardial effusion. Hepatobiliary: Normal hepatic contour. No discrete hepatic lesions. The gallbladder is distended with gallbladder wall thickening and minimal amount of associated pericholecystic fluid. These findings are associated with mild centralized intrahepatic biliary ductal dilatation and suspected periportal edema. No definite radiopaque gallstones. No ascites. Pancreas: Normal appearance of the pancreas. No  pancreatic duct dilatation. No peripancreatic stranding. Spleen: Normal appearance of the spleen. No parent splenic stranding. Adrenals/Urinary Tract: There is symmetric enhancement and excretion of the bilateral kidneys. No definite renal stones this postcontrast examination. No discrete renal lesions. No urinary obstruction or perinephric stranding. Normal appearance of the bilateral adrenal glands. Normal appearance of the urinary bladder given degree distention. Stomach/Bowel: Sequela of prior gastric bypass surgery. No evidence of enteric obstruction Vascular/Lymphatic: Moderate amount of mixed calcified and noncalcified atherosclerotic plaque within a normal caliber abdominal aorta. The major branch vessels of the abdominal aorta appear patent on this non CTA examination. No bulky retroperitoneal, mesenteric, pelvic or inguinal lymphadenopathy on this noncontrast examination. Reproductive: Post hysterectomy. No discrete adnexal lesion. No free fluid in the pelvic cul-de-sac. Other: Regional soft tissues appear normal. Musculoskeletal: Post right total hip replacement. Note is made of an approximately 6.9 x 6.1 x 5.6 cm fluid collection about the anterior aspect of the right total hip prosthesis extending all the right iliacus musculature (representative axial image 86 and 72, series 21, coronal image 51, series 203). No discrete areas of osteolysis. IMPRESSION: 1. Findings worrisome for acute cholecystitis. Further evaluation nuclear medicine HIDA scan could be performed as clinically indicated. 2. Sequela of prior gastric bypass surgery without evidence of enteric obstruction. 3. Post right total hip replacement with a indeterminate fluid collection about the anterior aspect of the right total hip replacement extending to involve the right iliacus musculature. Clinical correlation is advised. 4. Sequela of prior gastric bypass surgery without evidence of enteric obstruction. Electronically Signed   By: Simonne Come M.D.   On: 10/15/2015 22:53  Mr Abdomen Mrcp Wo Cm  Result Date: 10/16/2015 CLINICAL DATA:  Four-day history of worsening abdominal pain common nausea, vomiting and fever. Abnormal CT scan. EXAM: MRI ABDOMEN WITHOUT CONTRAST  (INCLUDING MRCP) TECHNIQUE: Multiplanar multisequence MR imaging of the abdomen was performed. Heavily T2-weighted images of the biliary and pancreatic ducts were obtained, and three-dimensional MRCP images were rendered by post processing. COMPARISON:  CT scan 10/15/2015 FINDINGS: Examination is quite limited due to respiratory motion. In particular, the MRCP images are nondiagnostic. Lower chest: Bibasilar atelectasis. No pleural effusions. The heart is normal in size. No pericardial effusion. Hepatobiliary: Fatty infiltration of the liver is noted. No focal hepatic lesions. Moderate periportal edema is noted. No intrahepatic biliary dilatation. No common bile duct dilatation is identified. The gallbladder demonstrates a markedly thickened wall measuring up to 11  mm. There is layering debris dependently in the gallbladder which is probably a combination of sludge and tiny stones. Moderate pericholecystic inflammatory changes and fluid highly suggestive of acute cholecystitis. No obvious common bile duct stones. Pancreas: No mass, inflammation or ductal dilatation. Spleen: Normal size.  No focal lesions. Adrenals/Urinary Tract: The adrenal glands and kidneys are unremarkable. Stomach/Bowel: The stomach demonstrates surgical changes from bypass surgery. The visualized small bowel and colon are grossly normal. Vascular/Lymphatic: No mesenteric or retroperitoneal mass or adenopathy. The aorta is normal in caliber. Moderate distal atherosclerotic calcifications. Other: Small volume of fluid around the liver and in the right pericolic gutter. Musculoskeletal: No significant bony findings. IMPRESSION: 1. Very limited MR examination due to patient motion. The MRCP images are quite limited.  2. MR findings consistent with acute cholecystitis. No obvious common bile duct dilatation or common bile duct stones. 3. Normal appearance of the pancreas in normal caliber pancreatic duct. 4. 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 Electronically Signed   By: Rudie Meyer M.D.   On: 10/16/2015 20:35  Mr 3d Recon At Scanner  Result Date: 10/16/2015 CLINICAL DATA:  Four-day history of worsening abdominal pain common nausea, vomiting and fever. Abnormal CT scan. EXAM: MRI ABDOMEN WITHOUT CONTRAST  (INCLUDING MRCP) TECHNIQUE: Multiplanar multisequence MR imaging of the abdomen was performed. Heavily T2-weighted images of the biliary and pancreatic ducts were obtained, and three-dimensional MRCP images were rendered by post processing. COMPARISON:  CT scan 10/15/2015 FINDINGS: Examination is quite limited due to respiratory motion. In particular, the MRCP images are nondiagnostic. Lower chest: Bibasilar atelectasis. No pleural effusions. The heart is normal in size. No pericardial effusion. Hepatobiliary: Fatty infiltration of the liver is noted. No focal hepatic lesions. Moderate periportal edema is noted. No intrahepatic biliary dilatation. No common bile duct dilatation is identified. The gallbladder demonstrates a markedly thickened wall measuring up to 11 mm. There is layering debris dependently in the gallbladder which is probably a combination of sludge and tiny stones. Moderate pericholecystic inflammatory changes and fluid highly suggestive of acute cholecystitis. No obvious common bile duct stones. Pancreas: No mass, inflammation or ductal dilatation. Spleen: Normal size.  No focal lesions. Adrenals/Urinary Tract: The adrenal glands and kidneys are unremarkable. Stomach/Bowel: The stomach demonstrates surgical changes from bypass surgery. The visualized small bowel and colon are grossly normal. Vascular/Lymphatic: No mesenteric or retroperitoneal mass or adenopathy. The aorta is normal in caliber.  Moderate distal atherosclerotic calcifications. Other: Small volume of fluid around the liver and in the right pericolic gutter. Musculoskeletal: No significant bony findings. IMPRESSION: 1. Very limited MR examination due to patient motion. The MRCP images are quite limited. 2. MR findings consistent with acute cholecystitis. No obvious common bile duct dilatation or common bile duct stones. 3. Normal appearance of the pancreas in normal caliber pancreatic duct. 4. 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 Electronically Signed   By: Rudie Meyer M.D.   On: 10/16/2015 20:35  US Abdomen Limited Ruq  Result Date: 10/15/2015 CLINICAL DATA:  65 year old female with generalized abdominal pain and fever. EXAM: US ABDOMEN LIMITED - RIGHT UPPER QUADRANT COMPARISON:  None. FINDINGS: Gallbladder: There is sludge within the gallbladder. No significant gallbladder wall thickening or pericholecystic fluid. There is slight indistinctness of the gallbladder wall which may be technical. Mild edema is not excluded. Negative sonographic Murphy's sign. Common bile duct: Diameter: 6 mm Liver: There is mild diffuse increased hepatic echogenicity likely fatty infiltration. IMPRESSION: Gallbladder sludge. No definite sonographic evidence of acute cholecystitis. A hepatobiliary  scintigraphy may provide better evaluation of the gallbladder if an acute cholecystitis is clinically suspected. Fatty liver. Electronically Signed   By: Elgie Collard M.D.   On: 10/15/2015 21:01    CBC  Recent Labs Lab 10/15/15 1832 10/16/15 0634 10/17/15 0456 10/18/15 0534  WBC 13.2* 10.0 7.4 9.6  HGB 14.2 12.8 10.3* 11.5*  HCT 43.6 41.2 33.3* 37.0  PLT 210 188 164 200  MCV 92.8 93.2 92.2 92.5  MCH 30.2 29.0 28.5 28.8  MCHC 32.6 31.1 30.9 31.1  RDW 13.4 13.6 14.0 14.1  LYMPHSABS 0.6* 0.9  --   --   MONOABS 0.9 0.6  --   --   EOSABS 0.0 0.0  --   --   BASOSABS 0.0 0.0  --   --     Chemistries   Recent Labs Lab 10/15/15 1832  10/16/15 0634 10/17/15 0456 10/18/15 0534  NA 133* 138 137 136  K 3.6 3.4* 3.0* 2.9*  CL 99* 107 108 104  CO2 24 24 24 25   GLUCOSE 235* 130* 128* 131*  BUN 9 <5* 6 <5*  CREATININE 0.88 0.89 0.75 0.83  CALCIUM 8.8* 7.9* 7.6* 8.0*  MG  --   --   --  1.7  AST 147* 107* 56* 57*  ALT 106* 85* 55* 48  ALKPHOS 286* 274* 211* 197*  BILITOT 1.6* 2.6* 1.2 1.1   ------------------------------------------------------------------------------------------------------------------ estimated creatinine clearance is 86.4 mL/min (by C-G formula based on SCr of 0.83 mg/dL). ------------------------------------------------------------------------------------------------------------------  Recent Labs  10/16/15 0201  HGBA1C 5.5   ------------------------------------------------------------------------------------------------------------------ No results for input(s): CHOL, HDL, LDLCALC, TRIG, CHOLHDL, LDLDIRECT in the last 72 hours. ------------------------------------------------------------------------------------------------------------------ No results for input(s): TSH, T4TOTAL, T3FREE, THYROIDAB in the last 72 hours.  Invalid input(s): FREET3 ------------------------------------------------------------------------------------------------------------------ No results for input(s): VITAMINB12, FOLATE, FERRITIN, TIBC, IRON, RETICCTPCT in the last 72 hours.  Coagulation profile  Recent Labs Lab 10/16/15 0201  INR 1.30    No results for input(s): DDIMER in the last 72 hours.  Cardiac Enzymes No results for input(s): CKMB, TROPONINI, MYOGLOBIN in the last 168 hours.  Invalid input(s): CK ------------------------------------------------------------------------------------------------------------------ Invalid input(s): POCBNP   CBG:  Recent Labs Lab 10/17/15 2023 10/18/15 0019 10/18/15 0447 10/18/15 0749 10/18/15 1225  GLUCAP 101* 111* 126* 133* 132*       Studies: Dg  Cholangiogram Operative  Result Date: 10/17/2015 CLINICAL DATA:  Acute cholecystitis EXAM: INTRAOPERATIVE CHOLANGIOGRAM TECHNIQUE: Cholangiographic images from the C-arm fluoroscopic device were submitted for interpretation post-operatively. Please see the procedural report for the amount of contrast and the fluoroscopy time utilized. COMPARISON:  10/16/2015 FINDINGS: The biliary confluence, common hepatic duct, residual cystic duct, and common bile duct are all patent. Contrast drains into the duodenum. No filling defect or obstruction. IMPRESSION: Patent biliary system. Electronically Signed   By: Judie Petit.  Shick M.D.   On: 10/17/2015 11:37  Mr Abdomen Mrcp Wo Cm  Result Date: 10/16/2015 CLINICAL DATA:  Four-day history of worsening abdominal pain common nausea, vomiting and fever. Abnormal CT scan. EXAM: MRI ABDOMEN WITHOUT CONTRAST  (INCLUDING MRCP) TECHNIQUE: Multiplanar multisequence MR imaging of the abdomen was performed. Heavily T2-weighted images of the biliary and pancreatic ducts were obtained, and three-dimensional MRCP images were rendered by post processing. COMPARISON:  CT scan 10/15/2015 FINDINGS: Examination is quite limited due to respiratory motion. In particular, the MRCP images are nondiagnostic. Lower chest: Bibasilar atelectasis. No pleural effusions. The heart is normal in size. No pericardial effusion. Hepatobiliary: Fatty infiltration of the liver is noted. No focal  hepatic lesions. Moderate periportal edema is noted. No intrahepatic biliary dilatation. No common bile duct dilatation is identified. The gallbladder demonstrates a markedly thickened wall measuring up to 11 mm. There is layering debris dependently in the gallbladder which is probably a combination of sludge and tiny stones. Moderate pericholecystic inflammatory changes and fluid highly suggestive of acute cholecystitis. No obvious common bile duct stones. Pancreas: No mass, inflammation or ductal dilatation. Spleen: Normal  size.  No focal lesions. Adrenals/Urinary Tract: The adrenal glands and kidneys are unremarkable. Stomach/Bowel: The stomach demonstrates surgical changes from bypass surgery. The visualized small bowel and colon are grossly normal. Vascular/Lymphatic: No mesenteric or retroperitoneal mass or adenopathy. The aorta is normal in caliber. Moderate distal atherosclerotic calcifications. Other: Small volume of fluid around the liver and in the right pericolic gutter. Musculoskeletal: No significant bony findings. IMPRESSION: 1. Very limited MR examination due to patient motion. The MRCP images are quite limited. 2. MR findings consistent with acute cholecystitis. No obvious common bile duct dilatation or common bile duct stones. 3. Normal appearance of the pancreas in normal caliber pancreatic duct. 4. 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 Electronically Signed   By: Rudie Meyer M.D.   On: 10/16/2015 20:35  Mr 3d Recon At Scanner  Result Date: 10/16/2015 CLINICAL DATA:  Four-day history of worsening abdominal pain common nausea, vomiting and fever. Abnormal CT scan. EXAM: MRI ABDOMEN WITHOUT CONTRAST  (INCLUDING MRCP) TECHNIQUE: Multiplanar multisequence MR imaging of the abdomen was performed. Heavily T2-weighted images of the biliary and pancreatic ducts were obtained, and three-dimensional MRCP images were rendered by post processing. COMPARISON:  CT scan 10/15/2015 FINDINGS: Examination is quite limited due to respiratory motion. In particular, the MRCP images are nondiagnostic. Lower chest: Bibasilar atelectasis. No pleural effusions. The heart is normal in size. No pericardial effusion. Hepatobiliary: Fatty infiltration of the liver is noted. No focal hepatic lesions. Moderate periportal edema is noted. No intrahepatic biliary dilatation. No common bile duct dilatation is identified. The gallbladder demonstrates a markedly thickened wall measuring up to 11 mm. There is layering debris dependently in  the gallbladder which is probably a combination of sludge and tiny stones. Moderate pericholecystic inflammatory changes and fluid highly suggestive of acute cholecystitis. No obvious common bile duct stones. Pancreas: No mass, inflammation or ductal dilatation. Spleen: Normal size.  No focal lesions. Adrenals/Urinary Tract: The adrenal glands and kidneys are unremarkable. Stomach/Bowel: The stomach demonstrates surgical changes from bypass surgery. The visualized small bowel and colon are grossly normal. Vascular/Lymphatic: No mesenteric or retroperitoneal mass or adenopathy. The aorta is normal in caliber. Moderate distal atherosclerotic calcifications. Other: Small volume of fluid around the liver and in the right pericolic gutter. Musculoskeletal: No significant bony findings. IMPRESSION: 1. Very limited MR examination due to patient motion. The MRCP images are quite limited. 2. MR findings consistent with acute cholecystitis. No obvious common bile duct dilatation or common bile duct stones. 3. Normal appearance of the pancreas in normal caliber pancreatic duct. 4. 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 Electronically Signed   By: Rudie Meyer M.D.   On: 10/16/2015 20:35     Lab Results  Component Value Date   HGBA1C 5.5 10/16/2015   Lab Results  Component Value Date   CREATININE 0.83 10/18/2015       Scheduled Meds: . acetaminophen  1,000 mg Oral TID  . DULoxetine  60 mg Oral Daily  . estradiol  0.5 mg Oral Daily  . heparin  5,000 Units Subcutaneous Q8H  .  insulin aspart  0-9 Units Subcutaneous Q4H  . levothyroxine  88 mcg Oral QAC breakfast  . lip balm   Topical BID  . montelukast  10 mg Oral QHS  . pantoprazole  40 mg Oral BID  . piperacillin-tazobactam (ZOSYN)  IV  3.375 g Intravenous Q8H  . potassium chloride  40 mEq Oral BID  . rosuvastatin  10 mg Oral QPM  . sodium chloride flush  3 mL Intravenous Q12H   Continuous Infusions: . dextrose 5 % and 0.9% NaCl 50 mL/hr  at 10/17/15 1423     LOS: 2 days    Time spent: >30 MINS    [facility]  [facility] Pager [phone]. If 7PM-7AM, please contact night-coverage at [email or web address], password [facility] 10/18/2015, 3:12 PM  LOS: 2 days

## 2015-10-18 NOTE — Progress Notes (Signed)
1 Day Post-Op  Subjective: Nauseated Vomited Sore - medicines help Up to BR only   Objective: Vital signs in last 24 hours: Temp:  [97 F (36.1 C)-100.2 F (37.9 C)] 99.1 F (37.3 C) (07/26 0755) Pulse Rate:  [80-103] 94 (07/26 0755) Resp:  [14-28] 18 (07/26 0755) BP: (109-151)/(51-91) 118/56 (07/26 0755) SpO2:  [91 %-96 %] 96 % (07/26 0755) Weight:  [115.2 kg (254 lb)] 115.2 kg (254 lb) (07/25 1358) Last BM Date: 10/16/15  Intake/Output from previous day: 07/25 0701 - 07/26 0700 In: 3095 [P.O.:480; I.V.:2565; IV Piggyback:50] Out: 220 [Urine:200; Blood:20] Intake/Output this shift: No intake/output data recorded.  General: Pt awake/alert/oriented x4 in mild acute distress Eyes: PERRL, normal EOM. Sclera nonicteric Neuro: CN II-XII intact w/o focal sensory/motor deficits. Lymph: No head/neck/groin lymphadenopathy Psych:  No delerium/psychosis/paranoia HENT: Normocephalic, Mucus membranes moist.  No thrush Neck: Supple, No tracheal deviation Chest: No pain.  Good respiratory excursion. CV:  Pulses intact.  Regular rhythm MS: Normal AROM mjr joints.  No obvious deformity Abdomen: Soft, Nondistended.  TTP at incisions.  No incarcerated hernias. Ext:  SCDs BLE.  No significant edema.  No cyanosis Skin: No petechiae / purpura   Lab Results:   Recent Labs  10/17/15 0456 10/18/15 0534  WBC 7.4 9.6  HGB 10.3* 11.5*  HCT 33.3* 37.0  PLT 164 200   BMET  Recent Labs  10/17/15 0456 10/18/15 0534  NA 137 136  K 3.0* 2.9*  CL 108 104  CO2 24 25  GLUCOSE 128* 131*  BUN 6 <5*  CREATININE 0.75 0.83  CALCIUM 7.6* 8.0*   Hepatic Function Latest Ref Rng & Units 10/18/2015 10/17/2015 10/16/2015  Total Protein 6.5 - 8.1 g/dL 5.3(L) 5.3(L) 6.0(L)  Albumin 3.5 - 5.0 g/dL 2.2(L) 2.2(L) 2.7(L)  AST 15 - 41 U/L 57(H) 56(H) 107(H)  ALT 14 - 54 U/L 48 55(H) 85(H)  Alk Phosphatase 38 - 126 U/L 197(H) 211(H) 274(H)  Total Bilirubin 0.3 - 1.2 mg/dL 1.1 1.2 2.6(H)  Bilirubin,  Direct - - - -    PT/INR  Recent Labs  10/16/15 0201  LABPROT 16.3*  INR 1.30   ABG No results for input(s): PHART, HCO3 in the last 72 hours.  Invalid input(s): PCO2, PO2  Studies/Results: Dg Cholangiogram Operative  Result Date: 10/17/2015 CLINICAL DATA:  Acute cholecystitis EXAM: INTRAOPERATIVE CHOLANGIOGRAM TECHNIQUE: Cholangiographic images from the C-arm fluoroscopic device were submitted for interpretation post-operatively. Please see the procedural report for the amount of contrast and the fluoroscopy time utilized. COMPARISON:  10/16/2015 FINDINGS: The biliary confluence, common hepatic duct, residual cystic duct, and common bile duct are all patent. Contrast drains into the duodenum. No filling defect or obstruction. IMPRESSION: Patent biliary system. Electronically Signed   By: Jerilynn Mages.  Shick M.D.   On: 10/17/2015 11:37  Mr Abdomen Mrcp Wo Cm  Result Date: 10/16/2015 CLINICAL DATA:  Four-day history of worsening abdominal pain common nausea, vomiting and fever. Abnormal CT scan. EXAM: MRI ABDOMEN WITHOUT CONTRAST  (INCLUDING MRCP) TECHNIQUE: Multiplanar multisequence MR imaging of the abdomen was performed. Heavily T2-weighted images of the biliary and pancreatic ducts were obtained, and three-dimensional MRCP images were rendered by post processing. COMPARISON:  CT scan 10/15/2015 FINDINGS: Examination is quite limited due to respiratory motion. In particular, the MRCP images are nondiagnostic. Lower chest: Bibasilar atelectasis. No pleural effusions. The heart is normal in size. No pericardial effusion. Hepatobiliary: Fatty infiltration of the liver is noted. No focal hepatic lesions. Moderate periportal edema is noted. No  intrahepatic biliary dilatation. No common bile duct dilatation is identified. The gallbladder demonstrates a markedly thickened wall measuring up to 11 mm. There is layering debris dependently in the gallbladder which is probably a combination of sludge and tiny  stones. Moderate pericholecystic inflammatory changes and fluid highly suggestive of acute cholecystitis. No obvious common bile duct stones. Pancreas: No mass, inflammation or ductal dilatation. Spleen: Normal size.  No focal lesions. Adrenals/Urinary Tract: The adrenal glands and kidneys are unremarkable. Stomach/Bowel: The stomach demonstrates surgical changes from bypass surgery. The visualized small bowel and colon are grossly normal. Vascular/Lymphatic: No mesenteric or retroperitoneal mass or adenopathy. The aorta is normal in caliber. Moderate distal atherosclerotic calcifications. Other: Small volume of fluid around the liver and in the right pericolic gutter. Musculoskeletal: No significant bony findings. IMPRESSION: 1. Very limited MR examination due to patient motion. The MRCP images are quite limited. 2. MR findings consistent with acute cholecystitis. No obvious common bile duct dilatation or common bile duct stones. 3. Normal appearance of the pancreas in normal caliber pancreatic duct. 4. 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 Electronically Signed   By: Marijo Sanes M.D.   On: 10/16/2015 20:35  Mr 3d Recon At Scanner  Result Date: 10/16/2015 CLINICAL DATA:  Four-day history of worsening abdominal pain common nausea, vomiting and fever. Abnormal CT scan. EXAM: MRI ABDOMEN WITHOUT CONTRAST  (INCLUDING MRCP) TECHNIQUE: Multiplanar multisequence MR imaging of the abdomen was performed. Heavily T2-weighted images of the biliary and pancreatic ducts were obtained, and three-dimensional MRCP images were rendered by post processing. COMPARISON:  CT scan 10/15/2015 FINDINGS: Examination is quite limited due to respiratory motion. In particular, the MRCP images are nondiagnostic. Lower chest: Bibasilar atelectasis. No pleural effusions. The heart is normal in size. No pericardial effusion. Hepatobiliary: Fatty infiltration of the liver is noted. No focal hepatic lesions. Moderate periportal edema  is noted. No intrahepatic biliary dilatation. No common bile duct dilatation is identified. The gallbladder demonstrates a markedly thickened wall measuring up to 11 mm. There is layering debris dependently in the gallbladder which is probably a combination of sludge and tiny stones. Moderate pericholecystic inflammatory changes and fluid highly suggestive of acute cholecystitis. No obvious common bile duct stones. Pancreas: No mass, inflammation or ductal dilatation. Spleen: Normal size.  No focal lesions. Adrenals/Urinary Tract: The adrenal glands and kidneys are unremarkable. Stomach/Bowel: The stomach demonstrates surgical changes from bypass surgery. The visualized small bowel and colon are grossly normal. Vascular/Lymphatic: No mesenteric or retroperitoneal mass or adenopathy. The aorta is normal in caliber. Moderate distal atherosclerotic calcifications. Other: Small volume of fluid around the liver and in the right pericolic gutter. Musculoskeletal: No significant bony findings. IMPRESSION: 1. Very limited MR examination due to patient motion. The MRCP images are quite limited. 2. MR findings consistent with acute cholecystitis. No obvious common bile duct dilatation or common bile duct stones. 3. Normal appearance of the pancreas in normal caliber pancreatic duct. 4. 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 Electronically Signed   By: Marijo Sanes M.D.   On: 10/16/2015 20:35   Anti-infectives: Anti-infectives    Start     Dose/Rate Route Frequency Ordered Stop   10/16/15 0400  piperacillin-tazobactam (ZOSYN) IVPB 3.375 g    Comments:  Zosyn 3.375 g IV q8h for CrCl > 20 mL/min   3.375 g 12.5 mL/hr over 240 Minutes Intravenous Every 8 hours 10/16/15 0105     10/15/15 2100  piperacillin-tazobactam (ZOSYN) IVPB 3.375 g     3.375 g 100  mL/hr over 30 Minutes Intravenous  Once 10/15/15 2056 10/15/15 2131      Assessment/Plan: Acute cholecystitis -s/p lap chole  Control nausea - change  narcotics.  Labs OK  Improve pain control  Cont ABx  Correct low K.  Check Mg  MOBILIZE  Consider d/c telemetry - defer to [facility]  SSI - consider stopping with glc <150 last `  D/C patient from hospital when patient meets criteria (anticipate in 1-2 day(s)):  Tolerating oral intake well Ambulating well Adequate pain control without IV medications Urinating  Having flatus Disposition planning in place     LOS: 2 days    Haley Larson C. 10/18/2015

## 2015-10-19 ENCOUNTER — Inpatient Hospital Stay (HOSPITAL_COMMUNITY): Payer: Managed Care, Other (non HMO)

## 2015-10-19 DIAGNOSIS — K8309 Other cholangitis: Secondary | ICD-10-CM

## 2015-10-19 DIAGNOSIS — K812 Acute cholecystitis with chronic cholecystitis: Secondary | ICD-10-CM

## 2015-10-19 DIAGNOSIS — K8020 Calculus of gallbladder without cholecystitis without obstruction: Secondary | ICD-10-CM

## 2015-10-19 DIAGNOSIS — R140 Abdominal distension (gaseous): Secondary | ICD-10-CM

## 2015-10-19 LAB — COMPREHENSIVE METABOLIC PANEL
ALBUMIN: 2.3 g/dL — AB (ref 3.5–5.0)
ALK PHOS: 178 U/L — AB (ref 38–126)
ALT: 42 U/L (ref 14–54)
ANION GAP: 7 (ref 5–15)
AST: 43 U/L — AB (ref 15–41)
BILIRUBIN TOTAL: 0.9 mg/dL (ref 0.3–1.2)
CALCIUM: 8.4 mg/dL — AB (ref 8.9–10.3)
CO2: 26 mmol/L (ref 22–32)
Chloride: 103 mmol/L (ref 101–111)
Creatinine, Ser: 0.74 mg/dL (ref 0.44–1.00)
GFR calc Af Amer: >60 mL/min (ref >60)
GFR calc non Af Amer: >60 mL/min (ref >60)
GLUCOSE: 111 mg/dL — AB (ref 65–99)
POTASSIUM: 3.4 mmol/L — AB (ref 3.5–5.1)
Sodium: 136 mmol/L (ref 135–145)
TOTAL PROTEIN: 5.9 g/dL — AB (ref 6.5–8.1)

## 2015-10-19 LAB — GLUCOSE, CAPILLARY
GLUCOSE-CAPILLARY: 112 mg/dL — AB (ref 65–99)
GLUCOSE-CAPILLARY: 131 mg/dL — AB (ref 65–99)
Glucose-Capillary: 123 mg/dL — ABNORMAL HIGH (ref 65–99)
Glucose-Capillary: 119 mg/dL — ABNORMAL HIGH (ref 65–99)
Glucose-Capillary: 130 mg/dL — ABNORMAL HIGH (ref 65–99)
Glucose-Capillary: 124 mg/dL — ABNORMAL HIGH (ref 65–99)

## 2015-10-19 LAB — TROPONIN I

## 2015-10-19 MED ORDER — BISACODYL 10 MG RE SUPP
10.0000 mg | Freq: Once | RECTAL | Status: AC
  Administered 2015-10-19: 10 mg via RECTAL
  Filled 2015-10-19: qty 1

## 2015-10-19 MED ORDER — PROMETHAZINE HCL 25 MG/ML IJ SOLN
12.5000 mg | Freq: Four times a day (QID) | INTRAMUSCULAR | Status: DC | PRN
  Administered 2015-10-19 (×2): 12.5 mg via INTRAVENOUS
  Filled 2015-10-19 (×2): qty 1

## 2015-10-19 MED ORDER — POLYETHYLENE GLYCOL 3350 17 G PO PACK
17.0000 g | PACK | Freq: Two times a day (BID) | ORAL | Status: DC
  Administered 2015-10-19 – 2015-10-22 (×4): 17 g via ORAL
  Filled 2015-10-19 (×5): qty 1

## 2015-10-19 MED ORDER — POTASSIUM CHLORIDE 10 MEQ/100ML IV SOLN
10.0000 meq | INTRAVENOUS | Status: AC
  Administered 2015-10-19 (×4): 10 meq via INTRAVENOUS
  Filled 2015-10-19 (×4): qty 100

## 2015-10-19 MED ORDER — TRAMADOL HCL 50 MG PO TABS: 50.0000 mg | ORAL_TABLET | Freq: Four times a day (QID) | ORAL | Status: DC

## 2015-10-19 MED ORDER — MORPHINE SULFATE (PF) 2 MG/ML IV SOLN
1.0000 mg | INTRAVENOUS | Status: DC | PRN
  Administered 2015-10-19 (×3): 1 mg via INTRAVENOUS
  Filled 2015-10-19 (×3): qty 1

## 2015-10-19 NOTE — Progress Notes (Signed)
[facility] PROGRESS NOTE  ELESE RANE ZOX:[id number] DOB: 08/04/1950 DOA: 10/15/2015   PCP: April Manson, NP     Assessment/Plan: Principal Problem:   Acute cholecystitis s/p lap chole 10/17/2015 Active Problems:   Hyperlipidemia   Status post THR (total hip replacement)   History of gastric bypass   Hypothyroidism   GERD (gastroesophageal reflux disease)   Hyponatremia   Hyperglycemia   Sepsis (HCC)   Acute cholangitis   Elective surgical procedure   Elevated LFTs   Hypokalemia   65 y.o.femalewith medical history significant for morbid obesity status post open gastric bypass in the 1970s, arthritis with chronic pain, hypothyroidism, and hyperlipidemia who presents the emergency department for evaluation of 4 days of worsening abdominal pain, nausea, and vomiting with development of fever. Now has Acute cholecystitis - possible common bile duct obstruction with increasing bilirubin. Patient status post laparoscopic cholecystectomy on 7/25 now with postoperative ileus  Assessment and plan Cholelithiasis, acute cholecystitis,    MRCP  without   strong evid of CBD stone = lap chole with intraoperative cholangiogram 7/25 Continue Zosyn Now patient has developed postoperative ileus with multiple episodes of nausea and vomiting Patient seen by GI   Dr Rhea Belton, no plans to perform ERCP Nauseous after surgery not tolerating full liquids  Future GI follow up with Dr Mann/Hung Trial of MiraLAX and Dulcolax, KUB  hypokalemia  Replete aggressively  Sepsis-improving, likely secondary to #1 No growth so far on urine culture, blood culture Continue Zosyn    Hyponatremia   - Serum sodium 133 on admission in setting of dehydration , resolved    3. GERD  - Stable, managed with Protonix BID at home - Continue Protonix q12h, converted to IV while NPO    4. Hypothyroidism  - Appears to be stable  - Resume Synthroid once appropriate for PO intake    5.  Arthritis with chronic pain  - Stable, managed with APAP and prn Norco at home  - Currently controlling abd pain with fentanyl injections    6. Hyperglycemia  Hemoglobin A1c 5.5 - - Check CBG, sliding scale insulin     DVT prophylaxsis Lovenox  Code Status:  Full code     Family Communication: Discussed in detail with the patient, all imaging results, lab results explained to the patient   Disposition Plan:   In the next 1-2 days      Consultants:  GI  Gen. surgery    Procedures:  Laparoscopic cholecystectomy on 7/25  Antibiotics: Anti-infectives    Start     Dose/Rate Route Frequency Ordered Stop   10/16/15 0400  [MAR Hold]  piperacillin-tazobactam (ZOSYN) IVPB 3.375 g     (MAR Hold since 10/17/15 1010)  Comments:  Zosyn 3.375 g IV q8h for CrCl > 20 mL/min   3.375 g 12.5 mL/hr over 240 Minutes Intravenous Every 8 hours 10/16/15 0105         HPI/Subjective:  Not tolerating full liquids, has vomiting. 9/10 abdominal pain  Objective: Vitals:   10/18/15 1014 10/18/15 1432 10/18/15 2028 10/19/15 0440  BP: (!) 106/54 116/65 (!) 108/51 130/87  Pulse: 95 94 89 94  Resp: 18 18 18 16   Temp: 97.9 F (36.6 C) 98.4 F (36.9 C) 97.8 F (36.6 C) 98 F (36.7 C)  TempSrc: Oral Oral Oral Oral  SpO2: 95% 94% 95% 94%  Weight:    118 kg (260 lb 2.3 oz)  Height:  Intake/Output Summary (Last 24 hours) at 10/19/15 1043 Last data filed at 10/19/15 1610  Gross per 24 hour  Intake          1582.08 ml  Output              280 ml  Net          1302.08 ml    Exam:  Examination:  General exam: Appears calm and comfortable  Respiratory system: Clear to auscultation. Respiratory effort normal. Cardiovascular system: S1 & S2 heard, RRR. No JVD, murmurs, rubs, gallops or clicks. No pedal edema. Gastrointestinal system: Tender in the right upper quadrant. No organomegaly or masses felt. Normal bowel sounds heard. Central nervous system: Alert and oriented. No  focal neurological deficits. Extremities: Symmetric 5 x 5 power. Skin: No rashes, lesions or ulcers Psychiatry: Judgement and insight appear normal. Mood & affect appropriate.     Data Reviewed: I have personally reviewed following labs and imaging studies  Micro Results Recent Results (from the past 240 hour(s))  Culture, blood (Routine x 2)     Status: None (Preliminary result)   Collection Time: 10/15/15  6:32 PM  Result Value Ref Range Status   Specimen Description RIGHT ANTECUBITAL  Final   Special Requests BOTTLES DRAWN AEROBIC AND ANAEROBIC 10CC  Final   Culture NO GROWTH 3 DAYS  Final   Report Status PENDING  Incomplete  Culture, blood (Routine x 2)     Status: None (Preliminary result)   Collection Time: 10/15/15  7:03 PM  Result Value Ref Range Status   Specimen Description RIGHT ANTECUBITAL  Final   Special Requests BOTTLES DRAWN AEROBIC AND ANAEROBIC  Final   Culture NO GROWTH 3 DAYS  Final   Report Status PENDING  Incomplete  Urine culture     Status: None   Collection Time: 10/15/15  9:49 PM  Result Value Ref Range Status   Specimen Description URINE, CATHETERIZED  Final   Special Requests NONE  Final   Culture NO GROWTH  Final   Report Status 10/17/2015 FINAL  Final  Surgical PCR screen     Status: None   Collection Time: 10/16/15  2:00 AM  Result Value Ref Range Status   MRSA, PCR NEGATIVE NEGATIVE Final   Staphylococcus aureus NEGATIVE NEGATIVE Final    Comment:        The Xpert SA Assay (FDA approved for NASAL specimens in patients over 40 years of age), is one component of a comprehensive surveillance program.  Test performance has been validated by [facility] for patients greater than or equal to 40 year old. It is not intended to diagnose infection nor to guide or monitor treatment.     Radiology Reports Dg Chest 2 View  Result Date: 10/15/2015 CLINICAL DATA:  Fever, shortness of breath and chest pain for 1 day. EXAM: CHEST  2 VIEW  COMPARISON:  CT chest and PA and lateral chest 01/30/2010. FINDINGS: The lungs are clear. Heart size is normal. No pneumothorax or pleural effusion. No focal bony abnormality. Surgical clips left upper quadrant of the abdomen are noted. IMPRESSION: No acute disease. Electronically Signed   By: Drusilla Kanner M.D.   On: 10/15/2015 19:09  Dg Cholangiogram Operative  Result Date: 10/17/2015 CLINICAL DATA:  Acute cholecystitis EXAM: INTRAOPERATIVE CHOLANGIOGRAM TECHNIQUE: Cholangiographic images from the C-arm fluoroscopic device were submitted for interpretation post-operatively. Please see the procedural report for the amount of contrast and the fluoroscopy time utilized. COMPARISON:  10/16/2015 FINDINGS: The  biliary confluence, common hepatic duct, residual cystic duct, and common bile duct are all patent. Contrast drains into the duodenum. No filling defect or obstruction. IMPRESSION: Patent biliary system. Electronically Signed   By: Judie Petit.  Shick M.D.   On: 10/17/2015 11:37  Ct Abdomen Pelvis W Contrast  Result Date: 10/15/2015 CLINICAL DATA:  Generalized abdominal pain and nausea. EXAM: CT ABDOMEN AND PELVIS WITH CONTRAST TECHNIQUE: Multidetector CT imaging of the abdomen and pelvis was performed using the standard protocol following bolus administration of intravenous contrast. CONTRAST:  ISOVUE-300 IOPAMIDOL (ISOVUE-300) INJECTION 61% COMPARISON:  None. FINDINGS: Lower chest: Limited visualization of the lower thorax demonstrates minimal dependent subpleural ground-glass atelectasis, right greater than left. No discrete focal airspace opacities. No pleural effusion. Normal heart size.  No pericardial effusion. Hepatobiliary: Normal hepatic contour. No discrete hepatic lesions. The gallbladder is distended with gallbladder wall thickening and minimal amount of associated pericholecystic fluid. These findings are associated with mild centralized intrahepatic biliary ductal dilatation and suspected  periportal edema. No definite radiopaque gallstones. No ascites. Pancreas: Normal appearance of the pancreas. No pancreatic duct dilatation. No peripancreatic stranding. Spleen: Normal appearance of the spleen. No parent splenic stranding. Adrenals/Urinary Tract: There is symmetric enhancement and excretion of the bilateral kidneys. No definite renal stones this postcontrast examination. No discrete renal lesions. No urinary obstruction or perinephric stranding. Normal appearance of the bilateral adrenal glands. Normal appearance of the urinary bladder given degree distention. Stomach/Bowel: Sequela of prior gastric bypass surgery. No evidence of enteric obstruction Vascular/Lymphatic: Moderate amount of mixed calcified and noncalcified atherosclerotic plaque within a normal caliber abdominal aorta. The major branch vessels of the abdominal aorta appear patent on this non CTA examination. No bulky retroperitoneal, mesenteric, pelvic or inguinal lymphadenopathy on this noncontrast examination. Reproductive: Post hysterectomy. No discrete adnexal lesion. No free fluid in the pelvic cul-de-sac. Other: Regional soft tissues appear normal. Musculoskeletal: Post right total hip replacement. Note is made of an approximately 6.9 x 6.1 x 5.6 cm fluid collection about the anterior aspect of the right total hip prosthesis extending all the right iliacus musculature (representative axial image 86 and 72, series 21, coronal image 51, series 203). No discrete areas of osteolysis. IMPRESSION: 1. Findings worrisome for acute cholecystitis. Further evaluation nuclear medicine HIDA scan could be performed as clinically indicated. 2. Sequela of prior gastric bypass surgery without evidence of enteric obstruction. 3. Post right total hip replacement with a indeterminate fluid collection about the anterior aspect of the right total hip replacement extending to involve the right iliacus musculature. Clinical correlation is advised. 4.  Sequela of prior gastric bypass surgery without evidence of enteric obstruction. Electronically Signed   By: Simonne Come M.D.   On: 10/15/2015 22:53  Mr Abdomen Mrcp Wo Cm  Result Date: 10/16/2015 CLINICAL DATA:  Four-day history of worsening abdominal pain common nausea, vomiting and fever. Abnormal CT scan. EXAM: MRI ABDOMEN WITHOUT CONTRAST  (INCLUDING MRCP) TECHNIQUE: Multiplanar multisequence MR imaging of the abdomen was performed. Heavily T2-weighted images of the biliary and pancreatic ducts were obtained, and three-dimensional MRCP images were rendered by post processing. COMPARISON:  CT scan 10/15/2015 FINDINGS: Examination is quite limited due to respiratory motion. In particular, the MRCP images are nondiagnostic. Lower chest: Bibasilar atelectasis. No pleural effusions. The heart is normal in size. No pericardial effusion. Hepatobiliary: Fatty infiltration of the liver is noted. No focal hepatic lesions. Moderate periportal edema is noted. No intrahepatic biliary dilatation. No common bile duct dilatation is identified. The gallbladder demonstrates a  markedly thickened wall measuring up to 11 mm. There is layering debris dependently in the gallbladder which is probably a combination of sludge and tiny stones. Moderate pericholecystic inflammatory changes and fluid highly suggestive of acute cholecystitis. No obvious common bile duct stones. Pancreas: No mass, inflammation or ductal dilatation. Spleen: Normal size.  No focal lesions. Adrenals/Urinary Tract: The adrenal glands and kidneys are unremarkable. Stomach/Bowel: The stomach demonstrates surgical changes from bypass surgery. The visualized small bowel and colon are grossly normal. Vascular/Lymphatic: No mesenteric or retroperitoneal mass or adenopathy. The aorta is normal in caliber. Moderate distal atherosclerotic calcifications. Other: Small volume of fluid around the liver and in the right pericolic gutter. Musculoskeletal: No significant  bony findings. IMPRESSION: 1. Very limited MR examination due to patient motion. The MRCP images are quite limited. 2. MR findings consistent with acute cholecystitis. No obvious common bile duct dilatation or common bile duct stones. 3. Normal appearance of the pancreas in normal caliber pancreatic duct. 4. 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 Electronically Signed   By: Rudie Meyer M.D.   On: 10/16/2015 20:35  Mr 3d Recon At Scanner  Result Date: 10/16/2015 CLINICAL DATA:  Four-day history of worsening abdominal pain common nausea, vomiting and fever. Abnormal CT scan. EXAM: MRI ABDOMEN WITHOUT CONTRAST  (INCLUDING MRCP) TECHNIQUE: Multiplanar multisequence MR imaging of the abdomen was performed. Heavily T2-weighted images of the biliary and pancreatic ducts were obtained, and three-dimensional MRCP images were rendered by post processing. COMPARISON:  CT scan 10/15/2015 FINDINGS: Examination is quite limited due to respiratory motion. In particular, the MRCP images are nondiagnostic. Lower chest: Bibasilar atelectasis. No pleural effusions. The heart is normal in size. No pericardial effusion. Hepatobiliary: Fatty infiltration of the liver is noted. No focal hepatic lesions. Moderate periportal edema is noted. No intrahepatic biliary dilatation. No common bile duct dilatation is identified. The gallbladder demonstrates a markedly thickened wall measuring up to 11 mm. There is layering debris dependently in the gallbladder which is probably a combination of sludge and tiny stones. Moderate pericholecystic inflammatory changes and fluid highly suggestive of acute cholecystitis. No obvious common bile duct stones. Pancreas: No mass, inflammation or ductal dilatation. Spleen: Normal size.  No focal lesions. Adrenals/Urinary Tract: The adrenal glands and kidneys are unremarkable. Stomach/Bowel: The stomach demonstrates surgical changes from bypass surgery. The visualized small bowel and colon are  grossly normal. Vascular/Lymphatic: No mesenteric or retroperitoneal mass or adenopathy. The aorta is normal in caliber. Moderate distal atherosclerotic calcifications. Other: Small volume of fluid around the liver and in the right pericolic gutter. Musculoskeletal: No significant bony findings. IMPRESSION: 1. Very limited MR examination due to patient motion. The MRCP images are quite limited. 2. MR findings consistent with acute cholecystitis. No obvious common bile duct dilatation or common bile duct stones. 3. Normal appearance of the pancreas in normal caliber pancreatic duct. 4. 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 Electronically Signed   By: Rudie Meyer M.D.   On: 10/16/2015 20:35  Dg Abd Portable 1v  Result Date: 10/19/2015 CLINICAL DATA:  Nausea and vomiting before and post cholecystectomy 10/17/2015. EXAM: PORTABLE ABDOMEN - 1 VIEW COMPARISON:  CT 10/15/2015 FINDINGS: Examination demonstrates air throughout the colon. There are several air-filled dilated small bowel loops measuring 4.1 cm in diameter over the left abdomen. No definite free peritoneal air. Multiple surgical clips over the left abdomen as well as right upper quadrant. Surgical suture line over the stomach. There are degenerative changes of the spine and left hip. Right hip  arthroplasty is present. IMPRESSION: Air-filled dilated small bowel loops in the left abdomen measuring 4.1 cm in diameter with air throughout the colon. Findings likely due to postoperative ileus, however small bowel obstructive process not excluded. Recommend serial follow-up abdominal films as clinically indicated. Electronically Signed   By: Elberta Fortis M.D.   On: 10/19/2015 10:18  US Abdomen Limited Ruq  Result Date: 10/15/2015 CLINICAL DATA:  65 year old female with generalized abdominal pain and fever. EXAM: US ABDOMEN LIMITED - RIGHT UPPER QUADRANT COMPARISON:  None. FINDINGS: Gallbladder: There is sludge within the gallbladder. No significant  gallbladder wall thickening or pericholecystic fluid. There is slight indistinctness of the gallbladder wall which may be technical. Mild edema is not excluded. Negative sonographic Murphy's sign. Common bile duct: Diameter: 6 mm Liver: There is mild diffuse increased hepatic echogenicity likely fatty infiltration. IMPRESSION: Gallbladder sludge. No definite sonographic evidence of acute cholecystitis. A hepatobiliary scintigraphy may provide better evaluation of the gallbladder if an acute cholecystitis is clinically suspected. Fatty liver. Electronically Signed   By: Elgie Collard M.D.   On: 10/15/2015 21:01    CBC  Recent Labs Lab 10/15/15 1832 10/16/15 0634 10/17/15 0456 10/18/15 0534  WBC 13.2* 10.0 7.4 9.6  HGB 14.2 12.8 10.3* 11.5*  HCT 43.6 41.2 33.3* 37.0  PLT 210 188 164 200  MCV 92.8 93.2 92.2 92.5  MCH 30.2 29.0 28.5 28.8  MCHC 32.6 31.1 30.9 31.1  RDW 13.4 13.6 14.0 14.1  LYMPHSABS 0.6* 0.9  --   --   MONOABS 0.9 0.6  --   --   EOSABS 0.0 0.0  --   --   BASOSABS 0.0 0.0  --   --     Chemistries   Recent Labs Lab 10/15/15 1832 10/16/15 0634 10/17/15 0456 10/18/15 0534 10/19/15 0308  NA 133* 138 137 136 136  K 3.6 3.4* 3.0* 2.9* 3.4*  CL 99* 107 108 104 103  CO2 24 24 24 25 26   GLUCOSE 235* 130* 128* 131* 111*  BUN 9 <5* 6 <5* <5*  CREATININE 0.88 0.89 0.75 0.83 0.74  CALCIUM 8.8* 7.9* 7.6* 8.0* 8.4*  MG  --   --   --  1.7  --   AST 147* 107* 56* 57* 43*  ALT 106* 85* 55* 48 42  ALKPHOS 286* 274* 211* 197* 178*  BILITOT 1.6* 2.6* 1.2 1.1 0.9   ------------------------------------------------------------------------------------------------------------------ estimated creatinine clearance is 90.9 mL/min (by C-G formula based on SCr of 0.8 mg/dL). ------------------------------------------------------------------------------------------------------------------ No results for input(s): HGBA1C in the last 72  hours. ------------------------------------------------------------------------------------------------------------------ No results for input(s): CHOL, HDL, LDLCALC, TRIG, CHOLHDL, LDLDIRECT in the last 72 hours. ------------------------------------------------------------------------------------------------------------------ No results for input(s): TSH, T4TOTAL, T3FREE, THYROIDAB in the last 72 hours.  Invalid input(s): FREET3 ------------------------------------------------------------------------------------------------------------------ No results for input(s): VITAMINB12, FOLATE, FERRITIN, TIBC, IRON, RETICCTPCT in the last 72 hours.  Coagulation profile  Recent Labs Lab 10/16/15 0201  INR 1.30    No results for input(s): DDIMER in the last 72 hours.  Cardiac Enzymes No results for input(s): CKMB, TROPONINI, MYOGLOBIN in the last 168 hours.  Invalid input(s): CK ------------------------------------------------------------------------------------------------------------------ Invalid input(s): POCBNP   CBG:  Recent Labs Lab 10/18/15 1612 10/18/15 2026 10/19/15 0004 10/19/15 0337 10/19/15 0811  GLUCAP 165* 137* 112* 123* 131*       Studies: Dg Cholangiogram Operative  Result Date: 10/17/2015 CLINICAL DATA:  Acute cholecystitis EXAM: INTRAOPERATIVE CHOLANGIOGRAM TECHNIQUE: Cholangiographic images from the C-arm fluoroscopic device were submitted for interpretation post-operatively. Please see the procedural  report for the amount of contrast and the fluoroscopy time utilized. COMPARISON:  10/16/2015 FINDINGS: The biliary confluence, common hepatic duct, residual cystic duct, and common bile duct are all patent. Contrast drains into the duodenum. No filling defect or obstruction. IMPRESSION: Patent biliary system. Electronically Signed   By: Judie Petit.  Shick M.D.   On: 10/17/2015 11:37  Dg Abd Portable 1v  Result Date: 10/19/2015 CLINICAL DATA:  Nausea and vomiting before  and post cholecystectomy 10/17/2015. EXAM: PORTABLE ABDOMEN - 1 VIEW COMPARISON:  CT 10/15/2015 FINDINGS: Examination demonstrates air throughout the colon. There are several air-filled dilated small bowel loops measuring 4.1 cm in diameter over the left abdomen. No definite free peritoneal air. Multiple surgical clips over the left abdomen as well as right upper quadrant. Surgical suture line over the stomach. 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 IMPRESSION: Air-filled dilated small bowel loops in the left abdomen measuring 4.1 cm in diameter with air throughout the colon. Findings likely due to postoperative ileus, however small bowel obstructive process not excluded. Recommend serial follow-up abdominal films as clinically indicated. Electronically Signed   By: Elberta Fortis M.D.   On: 10/19/2015 10:18     Lab Results  Component Value Date   HGBA1C 5.5 10/16/2015   Lab Results  Component Value Date   CREATININE 0.74 10/19/2015       Scheduled Meds: . acetaminophen  1,000 mg Oral TID  . bisacodyl  10 mg Rectal Once  . DULoxetine  60 mg Oral Daily  . estradiol  0.5 mg Oral Daily  . heparin  5,000 Units Subcutaneous Q8H  . insulin aspart  0-9 Units Subcutaneous Q4H  . levothyroxine  88 mcg Oral QAC breakfast  . lip balm   Topical BID  . montelukast  10 mg Oral QHS  . pantoprazole  40 mg Oral BID  . piperacillin-tazobactam (ZOSYN)  IV  3.375 g Intravenous Q8H  . polyethylene glycol  17 g Oral BID  . potassium chloride  10 mEq Intravenous Q1 Hr x 4  . potassium chloride  40 mEq Oral BID  . rosuvastatin  10 mg Oral QPM  . sodium chloride flush  3 mL Intravenous Q12H  . traMADol  50 mg Oral Q6H   Continuous Infusions: . 0.9 % NaCl with KCl 40 mEq / L 75 mL/hr (10/18/15 1654)     LOS: 3 days    Time spent: >30 MINS    [facility]  [facility] Pager [phone]. If 7PM-7AM, please contact night-coverage at  [email or web address], password [facility] 10/19/2015, 10:43 AM  LOS: 3 days

## 2015-10-19 NOTE — Progress Notes (Signed)
2 Days Post-Op  Subjective: POD#1 Nauseated. Not tolerating full liquids, has vomiting. 9/10 abdominal pain. No flatus. No bowel movement. Did not sleep well. Ambulating some. Does not feel ready to go home. VSS. WBC 9.6.  Objective: Vital signs in last 24 hours: Temp:  [97.8 F (36.6 C)-98.4 F (36.9 C)] 98 F (36.7 C) (07/27 0440) Pulse Rate:  [89-95] 94 (07/27 0440) Resp:  [16-18] 16 (07/27 0440) BP: (106-130)/(51-87) 130/87 (07/27 0440) SpO2:  [94 %-95 %] 94 % (07/27 0440) Weight:  [260 lb 2.3 oz (118 kg)] 260 lb 2.3 oz (118 kg) (07/27 0440) Last BM Date: 10/16/15  Intake/Output from previous day: 07/26 0701 - 07/27 0700 In: 2332.1 [P.O.:240; I.V.:1842.1; IV Piggyback:250] Out: 280 [Urine:200; Emesis/NG output:80] Intake/Output this shift: No intake/output data recorded.  General appearance  Supine. Awake. Appears uncomfortable. Head: Normocephalic/atraumatic. PERRL bilaterally. Extraocular eye muscles in tact.  Neck: FROM Cardiovascular: Regular rate and rhythm without gallops, murmurs or rubs Respiratory: Lung sounds vesicular. No wheezes, rales or crackles auscultated Abdomen: Soft. Tender to light palpation in RUQ. Mild distension. No masses or rigidity. +BS.  Lab Results:   Recent Labs  10/17/15 0456 10/18/15 0534  WBC 7.4 9.6  HGB 10.3* 11.5*  HCT 33.3* 37.0  PLT 164 200   BMET  Recent Labs  10/18/15 0534 10/19/15 0308  NA 136 136  K 2.9* 3.4*  CL 104 103  CO2 25 26  GLUCOSE 131* 111*  BUN <5* <5*  CREATININE 0.83 0.74  CALCIUM 8.0* 8.4*   PT/INR No results for input(s): LABPROT, INR in the last 72 hours. ABG No results for input(s): PHART, HCO3 in the last 72 hours.  Invalid input(s): PCO2, PO2  Studies/Results: Dg Cholangiogram Operative  Result Date: 10/17/2015 CLINICAL DATA:  Acute cholecystitis EXAM: INTRAOPERATIVE CHOLANGIOGRAM TECHNIQUE: Cholangiographic images from the C-arm fluoroscopic device were submitted for interpretation  post-operatively. Please see the procedural report for the amount of contrast and the fluoroscopy time utilized. COMPARISON:  10/16/2015 FINDINGS: The biliary confluence, common hepatic duct, residual cystic duct, and common bile duct are all patent. Contrast drains into the duodenum. No filling defect or obstruction. IMPRESSION: Patent biliary system. Electronically Signed   By: Judie Petit.  Shick M.D.   On: 10/17/2015 11:37   Anti-infectives: Anti-infectives    Start     Dose/Rate Route Frequency Ordered Stop   10/16/15 0400  piperacillin-tazobactam (ZOSYN) IVPB 3.375 g    Comments:  Zosyn 3.375 g IV q8h for CrCl > 20 mL/min   3.375 g 12.5 mL/hr over 240 Minutes Intravenous Every 8 hours 10/16/15 0105     10/15/15 2100  piperacillin-tazobactam (ZOSYN) IVPB 3.375 g     3.375 g 100 mL/hr over 30 Minutes Intravenous  Once 10/15/15 2056 10/15/15 2131      Assessment/Plan: Acute Cholecystitis  S/p laparoscopic cholecystectomy by Dr. Manus Rudd on 10/17/2015 Need to control pain and nausea. Added tramadol and phenergan. FEN: Full liquids ID: Zosyn Day #3 VTE: Heparin, SCDs Dispo: Pain and nausea control. Possibly home tomorrow   Orvil Feil [facility]: Lambert Rhonda W 10/19/2015

## 2015-10-20 DIAGNOSIS — E876 Hypokalemia: Secondary | ICD-10-CM

## 2015-10-20 LAB — GLUCOSE, CAPILLARY
GLUCOSE-CAPILLARY: 132 mg/dL — AB (ref 65–99)
GLUCOSE-CAPILLARY: 106 mg/dL — AB (ref 65–99)
Glucose-Capillary: 103 mg/dL — ABNORMAL HIGH (ref 65–99)
Glucose-Capillary: 107 mg/dL — ABNORMAL HIGH (ref 65–99)
Glucose-Capillary: 113 mg/dL — ABNORMAL HIGH (ref 65–99)
Glucose-Capillary: 116 mg/dL — ABNORMAL HIGH (ref 65–99)
Glucose-Capillary: 141 mg/dL — ABNORMAL HIGH (ref 65–99)

## 2015-10-20 LAB — COMPREHENSIVE METABOLIC PANEL
ALBUMIN: 2.1 g/dL — AB (ref 3.5–5.0)
ALK PHOS: 153 U/L — AB (ref 38–126)
ALT: 29 U/L (ref 14–54)
AST: 24 U/L (ref 15–41)
Anion gap: 8 (ref 5–15)
CHLORIDE: 105 mmol/L (ref 101–111)
CO2: 25 mmol/L (ref 22–32)
CREATININE: 0.75 mg/dL (ref 0.44–1.00)
Calcium: 8.3 mg/dL — ABNORMAL LOW (ref 8.9–10.3)
GFR calc Af Amer: >60 mL/min (ref >60)
GFR calc non Af Amer: >60 mL/min (ref >60)
GLUCOSE: 115 mg/dL — AB (ref 65–99)
Potassium: 4.4 mmol/L (ref 3.5–5.1)
SODIUM: 138 mmol/L (ref 135–145)
Total Bilirubin: 0.8 mg/dL (ref 0.3–1.2)
Total Protein: 5.3 g/dL — ABNORMAL LOW (ref 6.5–8.1)

## 2015-10-20 LAB — CULTURE, BLOOD (ROUTINE X 2)
CULTURE: NO GROWTH
Culture: NO GROWTH

## 2015-10-20 LAB — CBC
HCT: 37 % (ref 36.0–46.0)
Hemoglobin: 12.2 g/dL (ref 12.0–15.0)
MCH: 29.6 pg (ref 26.0–34.0)
MCHC: 33 g/dL (ref 30.0–36.0)
MCV: 89.8 fL (ref 78.0–100.0)
PLATELETS: 302 10*3/uL (ref 150–400)
RBC: 4.12 MIL/uL (ref 3.87–5.11)
RDW: 14.4 % (ref 11.5–15.5)
WBC: 13.8 10*3/uL — ABNORMAL HIGH (ref 4.0–10.5)

## 2015-10-20 MED ORDER — SODIUM CHLORIDE 0.9 % IV BOLUS (SEPSIS)
1000.0000 mL | Freq: Once | INTRAVENOUS | Status: AC
  Administered 2015-10-20: 1000 mL via INTRAVENOUS

## 2015-10-20 MED ORDER — ENSURE ENLIVE PO LIQD
237.0000 mL | Freq: Two times a day (BID) | ORAL | Status: DC
  Administered 2015-10-20 – 2015-10-21 (×2): 237 mL via ORAL

## 2015-10-20 MED ORDER — ZOLPIDEM TARTRATE 5 MG PO TABS
5.0000 mg | ORAL_TABLET | Freq: Once | ORAL | Status: AC
  Administered 2015-10-21: 5 mg via ORAL
  Filled 2015-10-20: qty 1

## 2015-10-20 MED ORDER — POTASSIUM CHLORIDE 10 MEQ/100ML IV SOLN
10.0000 meq | INTRAVENOUS | Status: AC
  Administered 2015-10-20 (×2): 10 meq via INTRAVENOUS
  Filled 2015-10-20: qty 100

## 2015-10-20 NOTE — Progress Notes (Signed)
3 Days Post-Op  Subjective: Having nausea and intermittent vomiting but passing gas and had a BM  Objective: Vital signs in last 24 hours: Temp:  [98.1 F (36.7 C)-98.6 F (37 C)] 98.1 F (36.7 C) (07/27 2124) Pulse Rate:  [95-98] 95 (07/27 2124) BP: (143-159)/(58-67) 143/58 (07/27 2124) SpO2:  [94 %-97 %] 97 % (07/27 2124) Weight:  [116.3 kg (256 lb 6.4 oz)] 116.3 kg (256 lb 6.4 oz) (07/28 0500) Last BM Date: 10/20/15  Intake/Output from previous day: 07/27 0701 - 07/28 0700 In: 1115 [P.O.:240; I.V.:825; IV Piggyback:50] Out: 200 [Urine:200] Intake/Output this shift: Total I/O In: 230 [I.V.:130; IV Piggyback:100] Out: -   Abdomen soft, non distended, minimally tender  Lab Results:   Recent Labs  10/18/15 0534 10/20/15 0326  WBC 9.6 13.8*  HGB 11.5* 12.2  HCT 37.0 37.0  PLT 200 302   BMET  Recent Labs  10/19/15 0308 10/20/15 0326  NA 136 138  K 3.4* 4.4  CL 103 105  CO2 26 25  GLUCOSE 111* 115*  BUN <5* <5*  CREATININE 0.74 0.75  CALCIUM 8.4* 8.3*   PT/INR No results for input(s): LABPROT, INR in the last 72 hours. ABG No results for input(s): PHART, HCO3 in the last 72 hours.  Invalid input(s): PCO2, PO2  Studies/Results: Dg Abd Portable 1v  Result Date: 10/19/2015 CLINICAL DATA:  Nausea and vomiting before and post cholecystectomy 10/17/2015. EXAM: PORTABLE ABDOMEN - 1 VIEW COMPARISON:  CT 10/15/2015 FINDINGS: Examination demonstrates air throughout the colon. There are several air-filled dilated small bowel loops measuring 4.1 cm in diameter over the left abdomen. No definite free peritoneal air. Multiple surgical clips over the left abdomen as well as right upper quadrant. Surgical suture line over the stomach. 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 IMPRESSION: Air-filled dilated small bowel loops in the left abdomen measuring 4.1 cm in diameter with air throughout the colon. Findings likely due  to postoperative ileus, however small bowel obstructive process not excluded. Recommend serial follow-up abdominal films as clinically indicated. Electronically Signed   By: Elberta Fortis M.D.   On: 10/19/2015 10:18   Anti-infectives: Anti-infectives    Start     Dose/Rate Route Frequency Ordered Stop   10/16/15 0400  piperacillin-tazobactam (ZOSYN) IVPB 3.375 g    Comments:  Zosyn 3.375 g IV q8h for CrCl > 20 mL/min   3.375 g 12.5 mL/hr over 240 Minutes Intravenous Every 8 hours 10/16/15 0105     10/15/15 2100  piperacillin-tazobactam (ZOSYN) IVPB 3.375 g     3.375 g 100 mL/hr over 30 Minutes Intravenous  Once 10/15/15 2056 10/15/15 2131      Assessment/Plan: s/p Procedure(s): LAPAROSCOPIC CHOLECYSTECTOMY WITH INTRAOPERATIVE CHOLANGIOGRAM (N/A)  Ileus Bolus IVF Repeat labs in the morning Follow WBC  LOS: 4 days    Haley Larson A 10/20/2015

## 2015-10-20 NOTE — Progress Notes (Addendum)
Patient ID: DUHA ABAIR, female   DOB: [birth year], 65 y.o.   MRN: [id number]                                                                PROGRESS NOTE                                                                                                                                                                                                             Patient Demographics:    Haley Larson, is a 65 y.o. female, DOB - 07/09/1950, WUJ:[id number]  Admit date - 10/15/2015   Admitting Physician Briscoe Deutscher, MD  Outpatient Primary MD for the patient is WHITE, MARSHA L, NP  LOS - 5  Outpatient Specialists:   Chief Complaint  Patient presents with  . Emesis  . Nausea       Brief Narrative 65 y.o.femalewith medical history significant for morbid obesity status post open gastric bypass in the 1970s, arthritis with chronic pain, hypothyroidism, and hyperlipidemia who presents the emergency department for evaluation of 4 days of worsening abdominal pain, nausea, and vomiting with development of fever. Now has Acute cholecystitis - possible common bile duct obstruction with increasing bilirubin    Subjective:    Haley Larson today has begun having flatus.   No headache, No chest pain, No abdominal pain - No Nausea, No new weakness tingling or numbness, No Cough - SOB.    Assessment  & Plan :    Principal Problem:   Acute cholecystitis s/p lap chole 10/17/2015 Active Problems:   Hyperlipidemia   Status post THR (total hip replacement)   History of gastric bypass   Hypothyroidism   GERD (gastroesophageal reflux disease)   Hyponatremia   Hyperglycemia   Sepsis (HCC)   Acute cholangitis   Elective surgical procedure   Elevated LFTs   Hypokalemia   Abdominal distension   Cholelithiasis with acute cholangitis   Cholelithiasis, acute cholecystitis,  S/p Lap cholecystectomy 10/17/2015 Future GI follow up with Dr Mann/Hung, surgery  Ileus  Passing  flatus  Hypokalemia Replete Check cmp in am  Sepsis-improving, likely secondary to #1 No growth so far on urine culture, blood culture Continue Zosyn  Hyponatremia  - Serum sodium 133 on admission in setting of dehydration , resolved  GERD  Cont protoinx  Hypothyroidism  Appears to be stable  Cont Synthroid   Arthritis with chronic pain  - Stable, managed with APAP and prn Norco at home  - Currently controlling abd pain with fentanyl injections   Hyperglycemia  Hemoglobin A1c 5.5 - Serum glucose 235 on admission, possibly secondary to sepsis; no hx of DM  - Check CBG q4h for now; low-in  Protein calorie malnutrition Severe Start on ensure  Code Status : FULL CODE  Family Communication  :   Disposition Plan  :  home  Barriers For Discharge :   Consults  :  Surgery, GI  Procedures  : lap chole 10/17/2015  DVT Prophylaxis  :  Lovenox - SCDs   Lab Results  Component Value Date   PLT 302 10/20/2015    Antibiotics  :  zosyn  Anti-infectives    Start     Dose/Rate Route Frequency Ordered Stop   10/16/15 0400  piperacillin-tazobactam (ZOSYN) IVPB 3.375 g    Comments:  Zosyn 3.375 g IV q8h for CrCl > 20 mL/min   3.375 g 12.5 mL/hr over 240 Minutes Intravenous Every 8 hours 10/16/15 0105     10/15/15 2100  piperacillin-tazobactam (ZOSYN) IVPB 3.375 g     3.375 g 100 mL/hr over 30 Minutes Intravenous  Once 10/15/15 2056 10/15/15 2131        Objective:   Vitals:   10/19/15 0440 10/19/15 1421 10/19/15 2124 10/20/15 0500  BP: 130/87 (!) 159/67 (!) 143/58   Pulse: 94 98 95   Resp: 16     Temp: 98 F (36.7 C) 98.6 F (37 C) 98.1 F (36.7 C)   TempSrc: Oral Oral Oral   SpO2: 94% 94% 97%   Weight: 118 kg (260 lb 2.3 oz)   116.3 kg (256 lb 6.4 oz)  Height:        Wt Readings from Last 3 Encounters:  10/20/15 116.3 kg (256 lb 6.4 oz)  09/22/15 104.4 kg (230 lb 3.2 oz)  07/19/15 108.4 kg (239 lb)     Intake/Output Summary (Last 24 hours)  at 10/20/15 1191 Last data filed at 10/20/15 0747  Gross per 24 hour  Intake             1345 ml  Output              200 ml  Net             1145 ml     Physical Exam  Awake Alert, Oriented X 3, No new F.N deficits, Normal affect [place].AT,PERRAL Supple Neck,No JVD, No cervical lymphadenopathy appriciated.  Symmetrical Chest wall movement, Good air movement bilaterally, CTAB RRR,No Gallops,Rubs or new Murmurs, No Parasternal Heave +ve B.Sounds, Abd Soft, No tenderness, No organomegaly appriciated, No rebound - guarding or rigidity. No Cyanosis, Clubbing or edema, No new Rash or bruise     Data Review:    CBC  Recent Labs Lab 10/15/15 1832 10/16/15 0634 10/17/15 0456 10/18/15 0534 10/20/15 0326  WBC 13.2* 10.0 7.4 9.6 13.8*  HGB 14.2 12.8 10.3* 11.5* 12.2  HCT 43.6 41.2 33.3* 37.0 37.0  PLT 210 188 164 200 302  MCV 92.8 93.2 92.2 92.5 89.8  MCH 30.2 29.0 28.5 28.8 29.6  MCHC 32.6 31.1 30.9 31.1 33.0  RDW 13.4 13.6 14.0 14.1 14.4  LYMPHSABS 0.6* 0.9  --   --   --   MONOABS 0.9 0.6  --   --   --  EOSABS 0.0 0.0  --   --   --   BASOSABS 0.0 0.0  --   --   --     Chemistries   Recent Labs Lab 10/16/15 0634 10/17/15 0456 10/18/15 0534 10/19/15 0308 10/20/15 0326  NA 138 137 136 136 138  K 3.4* 3.0* 2.9* 3.4* 4.4  CL 107 108 104 103 105  CO2 24 24 25 26 25   GLUCOSE 130* 128* 131* 111* 115*  BUN <5* 6 <5* <5* <5*  CREATININE 0.89 0.75 0.83 0.74 0.75  CALCIUM 7.9* 7.6* 8.0* 8.4* 8.3*  MG  --   --  1.7  --   --   AST 107* 56* 57* 43* 24  ALT 85* 55* 48 42 29  ALKPHOS 274* 211* 197* 178* 153*  BILITOT 2.6* 1.2 1.1 0.9 0.8   ------------------------------------------------------------------------------------------------------------------ No results for input(s): CHOL, HDL, LDLCALC, TRIG, CHOLHDL, LDLDIRECT in the last 72 hours.  Lab Results  Component Value Date   HGBA1C 5.5 10/16/2015    ------------------------------------------------------------------------------------------------------------------ No results for input(s): TSH, T4TOTAL, T3FREE, THYROIDAB in the last 72 hours.  Invalid input(s): FREET3 ------------------------------------------------------------------------------------------------------------------ No results for input(s): VITAMINB12, FOLATE, FERRITIN, TIBC, IRON, RETICCTPCT in the last 72 hours.  Coagulation profile  Recent Labs Lab 10/16/15 0201  INR 1.30    No results for input(s): DDIMER in the last 72 hours.  Cardiac Enzymes  Recent Labs Lab 10/19/15 1051 10/19/15 1421  TROPONINI <0.03 <0.03   ------------------------------------------------------------------------------------------------------------------ No results found for: BNP  Inpatient Medications  Scheduled Meds: . acetaminophen  1,000 mg Oral TID  . DULoxetine  60 mg Oral Daily  . estradiol  0.5 mg Oral Daily  . heparin  5,000 Units Subcutaneous Q8H  . insulin aspart  0-9 Units Subcutaneous Q4H  . levothyroxine  88 mcg Oral QAC breakfast  . lip balm   Topical BID  . montelukast  10 mg Oral QHS  . pantoprazole  40 mg Oral BID  . piperacillin-tazobactam (ZOSYN)  IV  3.375 g Intravenous Q8H  . polyethylene glycol  17 g Oral BID  . potassium chloride  10 mEq Intravenous Q1 Hr x 2  . potassium chloride  40 mEq Oral BID  . rosuvastatin  10 mg Oral QPM  . sodium chloride  1,000 mL Intravenous Once  . sodium chloride flush  3 mL Intravenous Q12H   Continuous Infusions: . 0.9 % NaCl with KCl 40 mEq / L 75 mL/hr (10/18/15 1654)   PRN Meds:.alum & mag hydroxide-simeth, bisacodyl, dextrose, HYDROcodone-acetaminophen, magic mouthwash, menthol-cetylpyridinium, methocarbamol (ROBAXIN)  IV, methocarbamol, morphine injection, ondansetron **OR** ondansetron (ZOFRAN) IV, phenol, polyethylene glycol, promethazine  Micro Results Recent Results (from the past 240 hour(s))  Culture,  blood (Routine x 2)     Status: None (Preliminary result)   Collection Time: 10/15/15  6:32 PM  Result Value Ref Range Status   Specimen Description RIGHT ANTECUBITAL  Final   Special Requests BOTTLES DRAWN AEROBIC AND ANAEROBIC 10CC  Final   Culture NO GROWTH 4 DAYS  Final   Report Status PENDING  Incomplete  Culture, blood (Routine x 2)     Status: None (Preliminary result)   Collection Time: 10/15/15  7:03 PM  Result Value Ref Range Status   Specimen Description RIGHT ANTECUBITAL  Final   Special Requests BOTTLES DRAWN AEROBIC AND ANAEROBIC  Final   Culture NO GROWTH 4 DAYS  Final   Report Status PENDING  Incomplete  Urine culture     Status: None  Collection Time: 10/15/15  9:49 PM  Result Value Ref Range Status   Specimen Description URINE, CATHETERIZED  Final   Special Requests NONE  Final   Culture NO GROWTH  Final   Report Status 10/17/2015 FINAL  Final  Surgical PCR screen     Status: None   Collection Time: 10/16/15  2:00 AM  Result Value Ref Range Status   MRSA, PCR NEGATIVE NEGATIVE Final   Staphylococcus aureus NEGATIVE NEGATIVE Final    Comment:        The Xpert SA Assay (FDA approved for NASAL specimens in patients over 47 years of age), is one component of a comprehensive surveillance program.  Test performance has been validated by [facility] for patients greater than or equal to 79 year old. It is not intended to diagnose infection nor to guide or monitor treatment.     Radiology Reports Dg Chest 2 View  Result Date: 10/15/2015 CLINICAL DATA:  Fever, shortness of breath and chest pain for 1 day. EXAM: CHEST  2 VIEW COMPARISON:  CT chest and PA and lateral chest 01/30/2010. FINDINGS: The lungs are clear. Heart size is normal. No pneumothorax or pleural effusion. No focal bony abnormality. Surgical clips left upper quadrant of the abdomen are noted. IMPRESSION: No acute disease. Electronically Signed   By: Drusilla Kanner M.D.   On: 10/15/2015  19:09  Dg Cholangiogram Operative  Result Date: 10/17/2015 CLINICAL DATA:  Acute cholecystitis EXAM: INTRAOPERATIVE CHOLANGIOGRAM TECHNIQUE: Cholangiographic images from the C-arm fluoroscopic device were submitted for interpretation post-operatively. Please see the procedural report for the amount of contrast and the fluoroscopy time utilized. COMPARISON:  10/16/2015 FINDINGS: The biliary confluence, common hepatic duct, residual cystic duct, and common bile duct are all patent. Contrast drains into the duodenum. No filling defect or obstruction. IMPRESSION: Patent biliary system. Electronically Signed   By: Judie Petit.  Shick M.D.   On: 10/17/2015 11:37  Ct Abdomen Pelvis W Contrast  Result Date: 10/15/2015 CLINICAL DATA:  Generalized abdominal pain and nausea. EXAM: CT ABDOMEN AND PELVIS WITH CONTRAST TECHNIQUE: Multidetector CT imaging of the abdomen and pelvis was performed using the standard protocol following bolus administration of intravenous contrast. CONTRAST:  ISOVUE-300 IOPAMIDOL (ISOVUE-300) INJECTION 61% COMPARISON:  None. FINDINGS: Lower chest: Limited visualization of the lower thorax demonstrates minimal dependent subpleural ground-glass atelectasis, right greater than left. No discrete focal airspace opacities. No pleural effusion. Normal heart size.  No pericardial effusion. Hepatobiliary: Normal hepatic contour. No discrete hepatic lesions. The gallbladder is distended with gallbladder wall thickening and minimal amount of associated pericholecystic fluid. These findings are associated with mild centralized intrahepatic biliary ductal dilatation and suspected periportal edema. No definite radiopaque gallstones. No ascites. Pancreas: Normal appearance of the pancreas. No pancreatic duct dilatation. No peripancreatic stranding. Spleen: Normal appearance of the spleen. No parent splenic stranding. Adrenals/Urinary Tract: There is symmetric enhancement and excretion of the bilateral kidneys. No  definite renal stones this postcontrast examination. No discrete renal lesions. No urinary obstruction or perinephric stranding. Normal appearance of the bilateral adrenal glands. Normal appearance of the urinary bladder given degree distention. Stomach/Bowel: Sequela of prior gastric bypass surgery. No evidence of enteric obstruction Vascular/Lymphatic: Moderate amount of mixed calcified and noncalcified atherosclerotic plaque within a normal caliber abdominal aorta. The major branch vessels of the abdominal aorta appear patent on this non CTA examination. No bulky retroperitoneal, mesenteric, pelvic or inguinal lymphadenopathy on this noncontrast examination. Reproductive: Post hysterectomy. No discrete adnexal lesion. No free fluid in the pelvic  cul-de-sac. Other: Regional soft tissues appear normal. Musculoskeletal: Post right total hip replacement. Note is made of an approximately 6.9 x 6.1 x 5.6 cm fluid collection about the anterior aspect of the right total hip prosthesis extending all the right iliacus musculature (representative axial image 86 and 72, series 21, coronal image 51, series 203). No discrete areas of osteolysis. IMPRESSION: 1. Findings worrisome for acute cholecystitis. Further evaluation nuclear medicine HIDA scan could be performed as clinically indicated. 2. Sequela of prior gastric bypass surgery without evidence of enteric obstruction. 3. Post right total hip replacement with a indeterminate fluid collection about the anterior aspect of the right total hip replacement extending to involve the right iliacus musculature. Clinical correlation is advised. 4. Sequela of prior gastric bypass surgery without evidence of enteric obstruction. Electronically Signed   By: Simonne Come M.D.   On: 10/15/2015 22:53  Mr Abdomen Mrcp Wo Cm  Result Date: 10/16/2015 CLINICAL DATA:  Four-day history of worsening abdominal pain common nausea, vomiting and fever. Abnormal CT scan. EXAM: MRI ABDOMEN WITHOUT  CONTRAST  (INCLUDING MRCP) TECHNIQUE: Multiplanar multisequence MR imaging of the abdomen was performed. Heavily T2-weighted images of the biliary and pancreatic ducts were obtained, and three-dimensional MRCP images were rendered by post processing. COMPARISON:  CT scan 10/15/2015 FINDINGS: Examination is quite limited due to respiratory motion. In particular, the MRCP images are nondiagnostic. Lower chest: Bibasilar atelectasis. No pleural effusions. The heart is normal in size. No pericardial effusion. Hepatobiliary: Fatty infiltration of the liver is noted. No focal hepatic lesions. Moderate periportal edema is noted. No intrahepatic biliary dilatation. No common bile duct dilatation is identified. The gallbladder demonstrates a markedly thickened wall measuring up to 11 mm. There is layering debris dependently in the gallbladder which is probably a combination of sludge and tiny stones. Moderate pericholecystic inflammatory changes and fluid highly suggestive of acute cholecystitis. No obvious common bile duct stones. Pancreas: No mass, inflammation or ductal dilatation. Spleen: Normal size.  No focal lesions. Adrenals/Urinary Tract: The adrenal glands and kidneys are unremarkable. Stomach/Bowel: The stomach demonstrates surgical changes from bypass surgery. The visualized small bowel and colon are grossly normal. Vascular/Lymphatic: No mesenteric or retroperitoneal mass or adenopathy. The aorta is normal in caliber. Moderate distal atherosclerotic calcifications. Other: Small volume of fluid around the liver and in the right pericolic gutter. Musculoskeletal: No significant bony findings. IMPRESSION: 1. Very limited MR examination due to patient motion. The MRCP images are quite limited. 2. MR findings consistent with acute cholecystitis. No obvious common bile duct dilatation or common bile duct stones. 3. Normal appearance of the pancreas in normal caliber pancreatic duct. 4. Surgical changes from gastric  bypass surgery. Electronically Signed   By: Rudie Meyer M.D.   On: 10/16/2015 20:35  Mr 3d Recon At Scanner  Result Date: 10/16/2015 CLINICAL DATA:  Four-day history of worsening abdominal pain common nausea, vomiting and fever. Abnormal CT scan. EXAM: MRI ABDOMEN WITHOUT CONTRAST  (INCLUDING MRCP) TECHNIQUE: Multiplanar multisequence MR imaging of the abdomen was performed. Heavily T2-weighted images of the biliary and pancreatic ducts were obtained, and three-dimensional MRCP images were rendered by post processing. COMPARISON:  CT scan 10/15/2015 FINDINGS: Examination is quite limited due to respiratory motion. In particular, the MRCP images are nondiagnostic. Lower chest: Bibasilar atelectasis. No pleural effusions. The heart is normal in size. No pericardial effusion. Hepatobiliary: Fatty infiltration of the liver is noted. No focal hepatic lesions. Moderate periportal edema is noted. No intrahepatic biliary dilatation. No common bile duct  dilatation is identified. The gallbladder demonstrates a markedly thickened wall measuring up to 11 mm. There is layering debris dependently in the gallbladder which is probably a combination of sludge and tiny stones. Moderate pericholecystic inflammatory changes and fluid highly suggestive of acute cholecystitis. No obvious common bile duct stones. Pancreas: No mass, inflammation or ductal dilatation. Spleen: Normal size.  No focal lesions. Adrenals/Urinary Tract: The adrenal glands and kidneys are unremarkable. Stomach/Bowel: The stomach demonstrates surgical changes from bypass surgery. The visualized small bowel and colon are grossly normal. Vascular/Lymphatic: No mesenteric or retroperitoneal mass or adenopathy. The aorta is normal in caliber. Moderate distal atherosclerotic calcifications. Other: Small volume of fluid around the liver and in the right pericolic gutter. Musculoskeletal: No significant bony findings. IMPRESSION: 1. Very limited MR examination due  to patient motion. The MRCP images are quite limited. 2. MR findings consistent with acute cholecystitis. No obvious common bile duct dilatation or common bile duct stones. 3. Normal appearance of the pancreas in normal caliber pancreatic duct. 4. 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 Electronically Signed   By: Rudie Meyer M.D.   On: 10/16/2015 20:35  Dg Abd Portable 1v  Result Date: 10/19/2015 CLINICAL DATA:  Nausea and vomiting before and post cholecystectomy 10/17/2015. EXAM: PORTABLE ABDOMEN - 1 VIEW COMPARISON:  CT 10/15/2015 FINDINGS: Examination demonstrates air throughout the colon. There are several air-filled dilated small bowel loops measuring 4.1 cm in diameter over the left abdomen. No definite free peritoneal air. Multiple surgical clips over the left abdomen as well as right upper quadrant. Surgical suture line over the stomach. 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 IMPRESSION: Air-filled dilated small bowel loops in the left abdomen measuring 4.1 cm in diameter with air throughout the colon. Findings likely due to postoperative ileus, however small bowel obstructive process not excluded. Recommend serial follow-up abdominal films as clinically indicated. Electronically Signed   By: Elberta Fortis M.D.   On: 10/19/2015 10:18  US Abdomen Limited Ruq  Result Date: 10/15/2015 CLINICAL DATA:  65 year old female with generalized abdominal pain and fever. EXAM: US ABDOMEN LIMITED - RIGHT UPPER QUADRANT COMPARISON:  None. FINDINGS: Gallbladder: There is sludge within the gallbladder. No significant gallbladder wall thickening or pericholecystic fluid. There is slight indistinctness of the gallbladder wall which may be technical. Mild edema is not excluded. Negative sonographic Murphy's sign. Common bile duct: Diameter: 6 mm Liver: There is mild diffuse increased hepatic echogenicity likely fatty infiltration. IMPRESSION: Gallbladder  sludge. No definite sonographic evidence of acute cholecystitis. A hepatobiliary scintigraphy may provide better evaluation of the gallbladder if an acute cholecystitis is clinically suspected. Fatty liver. Electronically Signed   By: Elgie Collard M.D.   On: 10/15/2015 21:01   Time Spent in minutes  30   Pearson Grippe M.D on 10/20/2015 at 8:22 AM  Between 7am to 7pm - Pager - [phone]  After 7pm go to [email or web address] - password [facility]  [facility] -  Office  [phone]

## 2015-10-21 ENCOUNTER — Inpatient Hospital Stay (HOSPITAL_COMMUNITY): Payer: Managed Care, Other (non HMO)

## 2015-10-21 DIAGNOSIS — K567 Ileus, unspecified: Secondary | ICD-10-CM

## 2015-10-21 DIAGNOSIS — K56609 Unspecified intestinal obstruction, unspecified as to partial versus complete obstruction: Secondary | ICD-10-CM

## 2015-10-21 LAB — COMPREHENSIVE METABOLIC PANEL
ALBUMIN: 2.2 g/dL — AB (ref 3.5–5.0)
ALT: 33 U/L (ref 14–54)
ANION GAP: 8 (ref 5–15)
AST: 40 U/L (ref 15–41)
Alkaline Phosphatase: 173 U/L — ABNORMAL HIGH (ref 38–126)
BUN: 7 mg/dL (ref 6–20)
CO2: 26 mmol/L (ref 22–32)
Calcium: 8.5 mg/dL — ABNORMAL LOW (ref 8.9–10.3)
Chloride: 100 mmol/L — ABNORMAL LOW (ref 101–111)
Creatinine, Ser: 0.78 mg/dL (ref 0.44–1.00)
GFR calc non Af Amer: >60 mL/min (ref >60)
GLUCOSE: 88 mg/dL (ref 65–99)
POTASSIUM: 4.8 mmol/L (ref 3.5–5.1)
SODIUM: 134 mmol/L — AB (ref 135–145)
Total Bilirubin: 1 mg/dL (ref 0.3–1.2)
Total Protein: 5.5 g/dL — ABNORMAL LOW (ref 6.5–8.1)

## 2015-10-21 LAB — CBC
HCT: 38.1 % (ref 36.0–46.0)
Hemoglobin: 11.9 g/dL — ABNORMAL LOW (ref 12.0–15.0)
MCH: 28.8 pg (ref 26.0–34.0)
MCHC: 31.2 g/dL (ref 30.0–36.0)
MCV: 92.3 fL (ref 78.0–100.0)
PLATELETS: 370 10*3/uL (ref 150–400)
RBC: 4.13 MIL/uL (ref 3.87–5.11)
RDW: 14.7 % (ref 11.5–15.5)
WBC: 14.2 10*3/uL — ABNORMAL HIGH (ref 4.0–10.5)

## 2015-10-21 LAB — GLUCOSE, CAPILLARY
GLUCOSE-CAPILLARY: 119 mg/dL — AB (ref 65–99)
GLUCOSE-CAPILLARY: 103 mg/dL — AB (ref 65–99)
Glucose-Capillary: 90 mg/dL (ref 65–99)
Glucose-Capillary: 92 mg/dL (ref 65–99)
Glucose-Capillary: 91 mg/dL (ref 65–99)

## 2015-10-21 MED ORDER — DIPHENHYDRAMINE HCL 50 MG/ML IJ SOLN
25.0000 mg | Freq: Once | INTRAMUSCULAR | Status: AC
  Administered 2015-10-22: 25 mg via INTRAVENOUS
  Filled 2015-10-21: qty 1

## 2015-10-21 NOTE — Progress Notes (Signed)
Patient ID: Haley Larson, female   DOB: 03/21/1951, 65 y.o.   MRN: [id number] [facility] Progress Note:   4 Days Post-Op  Subjective: Mental status is clear.  Patient appears somewhat sluggish;  Has walked a few times today.   Objective: Vital signs in last 24 hours: Temp:  [97.7 F (36.5 C)-98.3 F (36.8 C)] 98.3 F (36.8 C) (07/29 0438) Pulse Rate:  [79-90] 86 (07/29 0438) Resp:  [16-17] 17 (07/29 0438) BP: (136-172)/(82-95) 172/85 (07/29 0438) SpO2:  [94 %-97 %] 94 % (07/29 0438) Weight:  [118.5 kg (261 lb 3.2 oz)] 118.5 kg (261 lb 3.2 oz) (07/29 0500)  Intake/Output from previous day: 07/28 0701 - 07/29 0700 In: 1519.3 [P.O.:340; I.V.:829.3; IV Piggyback:350] Out: -  Intake/Output this shift: No intake/output data recorded.  Physical Exam: Work of breathing is not labored.  Incisions covered ;  Mildly distended   Lab Results:  Results for orders placed or performed during the hospital encounter of 10/15/15 (from the past 48 hour(s))  Troponin I     Status: None   Collection Time: 10/19/15  2:21 PM  Result Value Ref Range   Troponin I <0.03 <0.03 ng/mL  Glucose, capillary     Status: Abnormal   Collection Time: 10/19/15  5:08 PM  Result Value Ref Range   Glucose-Capillary 130 (H) 65 - 99 mg/dL  Glucose, capillary     Status: Abnormal   Collection Time: 10/19/15  9:15 PM  Result Value Ref Range   Glucose-Capillary 124 (H) 65 - 99 mg/dL  Glucose, capillary     Status: Abnormal   Collection Time: 10/20/15  1:13 AM  Result Value Ref Range   Glucose-Capillary 116 (H) 65 - 99 mg/dL  Comprehensive metabolic panel     Status: Abnormal   Collection Time: 10/20/15  3:26 AM  Result Value Ref Range   Sodium 138 135 - 145 mmol/L   Potassium 4.4 3.5 - 5.1 mmol/L    Comment: DELTA CHECK NOTED NO VISIBLE HEMOLYSIS    Chloride 105 101 - 111 mmol/L   CO2 25 22 - 32 mmol/L   Glucose, Bld 115 (H) 65 - 99 mg/dL   BUN <5 (L) 6 - 20 mg/dL   Creatinine, Ser 0.75 0.44  - 1.00 mg/dL   Calcium 8.3 (L) 8.9 - 10.3 mg/dL   Total Protein 5.3 (L) 6.5 - 8.1 g/dL   Albumin 2.1 (L) 3.5 - 5.0 g/dL   AST 24 15 - 41 U/L   ALT 29 14 - 54 U/L   Alkaline Phosphatase 153 (H) 38 - 126 U/L   Total Bilirubin 0.8 0.3 - 1.2 mg/dL   GFR calc non Af Amer >60 >60 mL/min   GFR calc Af Amer >60 >60 mL/min    Comment: (NOTE) The eGFR has been calculated using the CKD EPI equation. This calculation has not been validated in all clinical situations. eGFR's persistently <60 mL/min signify possible Chronic Kidney Disease.    Anion gap 8 5 - 15  CBC     Status: Abnormal   Collection Time: 10/20/15  3:26 AM  Result Value Ref Range   WBC 13.8 (H) 4.0 - 10.5 K/uL   RBC 4.12 3.87 - 5.11 MIL/uL   Hemoglobin 12.2 12.0 - 15.0 g/dL   HCT 37.0 36.0 - 46.0 %   MCV 89.8 78.0 - 100.0 fL   MCH 29.6 26.0 - 34.0 pg   MCHC 33.0 30.0 - 36.0 g/dL   RDW 14.4 11.5 -  15.5 %   Platelets 302 150 - 400 K/uL  Glucose, capillary     Status: Abnormal   Collection Time: 10/20/15  4:37 AM  Result Value Ref Range   Glucose-Capillary 113 (H) 65 - 99 mg/dL  Glucose, capillary     Status: Abnormal   Collection Time: 10/20/15  8:06 AM  Result Value Ref Range   Glucose-Capillary 141 (H) 65 - 99 mg/dL  Glucose, capillary     Status: Abnormal   Collection Time: 10/20/15 12:08 PM  Result Value Ref Range   Glucose-Capillary 132 (H) 65 - 99 mg/dL  Glucose, capillary     Status: Abnormal   Collection Time: 10/20/15  5:03 PM  Result Value Ref Range   Glucose-Capillary 107 (H) 65 - 99 mg/dL  Glucose, capillary     Status: Abnormal   Collection Time: 10/20/15  8:08 PM  Result Value Ref Range   Glucose-Capillary 106 (H) 65 - 99 mg/dL  Glucose, capillary     Status: Abnormal   Collection Time: 10/20/15 11:48 PM  Result Value Ref Range   Glucose-Capillary 103 (H) 65 - 99 mg/dL  Glucose, capillary     Status: None   Collection Time: 10/21/15  4:31 AM  Result Value Ref Range   Glucose-Capillary 90 65 - 99  mg/dL  CBC     Status: Abnormal   Collection Time: 10/21/15  4:45 AM  Result Value Ref Range   WBC 14.2 (H) 4.0 - 10.5 K/uL   RBC 4.13 3.87 - 5.11 MIL/uL   Hemoglobin 11.9 (L) 12.0 - 15.0 g/dL   HCT 38.1 36.0 - 46.0 %   MCV 92.3 78.0 - 100.0 fL   MCH 28.8 26.0 - 34.0 pg   MCHC 31.2 30.0 - 36.0 g/dL   RDW 14.7 11.5 - 15.5 %   Platelets 370 150 - 400 K/uL  Comprehensive metabolic panel     Status: Abnormal   Collection Time: 10/21/15  4:45 AM  Result Value Ref Range   Sodium 134 (L) 135 - 145 mmol/L   Potassium 4.8 3.5 - 5.1 mmol/L   Chloride 100 (L) 101 - 111 mmol/L   CO2 26 22 - 32 mmol/L   Glucose, Bld 88 65 - 99 mg/dL   BUN 7 6 - 20 mg/dL   Creatinine, Ser 0.78 0.44 - 1.00 mg/dL   Calcium 8.5 (L) 8.9 - 10.3 mg/dL   Total Protein 5.5 (L) 6.5 - 8.1 g/dL   Albumin 2.2 (L) 3.5 - 5.0 g/dL   AST 40 15 - 41 U/L   ALT 33 14 - 54 U/L   Alkaline Phosphatase 173 (H) 38 - 126 U/L   Total Bilirubin 1.0 0.3 - 1.2 mg/dL   GFR calc non Af Amer >60 >60 mL/min   GFR calc Af Amer >60 >60 mL/min    Comment: (NOTE) The eGFR has been calculated using the CKD EPI equation. This calculation has not been validated in all clinical situations. eGFR's persistently <60 mL/min signify possible Chronic Kidney Disease.    Anion gap 8 5 - 15  Glucose, capillary     Status: None   Collection Time: 10/21/15  8:43 AM  Result Value Ref Range   Glucose-Capillary 92 65 - 99 mg/dL  Glucose, capillary     Status: Abnormal   Collection Time: 10/21/15 11:59 AM  Result Value Ref Range   Glucose-Capillary 119 (H) 65 - 99 mg/dL    Radiology/Results: No results found.  Anti-infectives: Anti-infectives  Start     Dose/Rate Route Frequency Ordered Stop   10/16/15 0400  piperacillin-tazobactam (ZOSYN) IVPB 3.375 g    Comments:  Zosyn 3.375 g IV q8h for CrCl > 20 mL/min   3.375 g 12.5 mL/hr over 240 Minutes Intravenous Every 8 hours 10/16/15 0105     10/15/15 2100  piperacillin-tazobactam (ZOSYN) IVPB  3.375 g     3.375 g 100 mL/hr over 30 Minutes Intravenous  Once 10/15/15 2056 10/15/15 2131      Assessment/Plan: Problem List: Patient Active Problem List   Diagnosis Date Noted  . Abdominal distension   . Cholelithiasis with acute cholangitis   . Hypokalemia 10/18/2015  . Elective surgical procedure   . Elevated LFTs   . Acute cholecystitis s/p lap chole 10/17/2015 10/16/2015  . Hyponatremia 10/16/2015  . Hyperglycemia 10/16/2015  . Sepsis ([place]) 10/16/2015  . Acute cholangitis 10/16/2015  . Symptomatic anemia 07/21/2015  . Acute kidney injury ([place]) 07/21/2015  . History of gastric bypass 07/21/2015  . Hypothyroidism 07/21/2015  . Systolic murmur 67/59/1638  . Leukocytosis 07/21/2015  . GERD (gastroesophageal reflux disease) 07/21/2015  . Obesity, morbid, BMI 40.0-49.9 ([place]) 07/21/2015  . Status post THR (total hip replacement) 07/19/2015  . Chest pain 10/14/2012  . Dyspnea on exertion 10/14/2012  . Hyperlipidemia 10/14/2012  . Morbid obesity ([place]) 10/14/2012    Post lap chole/IOC with ileus.   4 Days Post-Op    LOS: 5 days   Matt B. Hassell Done, MD, [facility] [phone] beeper [phone]  10/21/2015 12:20 PM

## 2015-10-21 NOTE — Progress Notes (Signed)
At 1630 patient vomited about 50 cc , noted Xray result in. Paged MD for the results and new order were given to insert NGT to low suction and to put patient on NPO

## 2015-10-21 NOTE — Progress Notes (Signed)
Patient ID: Haley Larson, female   DOB: 05-05-1950, 65 y.o.   MRN: [id number]                                                                PROGRESS NOTE                                                                                                                                                                                                             Patient Demographics:    Haley Larson, is a 65 y.o. female, DOB - [birth year], WUJ:[id number]  Admit date - 10/15/2015   Admitting Physician Briscoe Deutscher, MD  Outpatient Primary MD for the patient is WHITE, MARSHA L, NP  LOS - 5  Outpatient Specialists:    Chief Complaint  Patient presents with  . Emesis  . Nausea       Brief Narrative 65 y.o.femalewith medical history significant for morbid obesity status post open gastric bypass in the 1970s, arthritis with chronic pain, hypothyroidism, and hyperlipidemia who presents the emergency department for evaluation of 4 days of worsening abdominal pain, nausea, and vomiting with development of fever. Now has Acute cholecystitis - possible common bile duct obstruction with increasing bilirubin    Subjective:    Haley Larson today has not yet had bm,  Walking x3 today. , slight nausea.   No headache, No chest pain, No abdominal pain -No new weakness tingling or numbness, No Cough - SOB.   Assessment  & Plan :    Principal Problem:   Acute cholecystitis s/p lap chole 10/17/2015 Active Problems:   Hyperlipidemia   Status post THR (total hip replacement)   History of gastric bypass   Hypothyroidism   GERD (gastroesophageal reflux disease)   Hyponatremia   Hyperglycemia   Sepsis (HCC)   Acute cholangitis   Elective surgical procedure   Elevated LFTs   Hypokalemia   Abdominal distension   Cholelithiasis with acute cholangitis   Cholelithiasis, acute cholecystitis, S/p Lap cholecystectomy 10/17/2015 Future GI follow up with Dr Mann/Hung, surgery  Ileus  Passing  flatus Check abd xray today   Hypokalemia resolved Check cmp in am  Sepsis-improving, likely secondary to #1 No growth so far on urine culture, blood culture Continue Zosyn  Hyponatremia  improved  GERD  Cont protoinx  Hypothyroidism  Appears to be stable  Cont Synthroid   Arthritis with chronic pain  - Stable, managed with APAP and prn Norco at home  - Currently controlling abd pain with fentanyl injections   Hyperglycemia  Hemoglobin A1c 5.5 - Serum glucose 235 on admission, possibly secondary to sepsis; no hx of DM  - Check CBG q4h for now; low-in  Protein calorie malnutrition Severe cont on ensure     Code Status : FULL CODE  Family Communication  :  Disposition Plan  : home   Barriers For Discharge :   Consults  :  surgery  Procedures  : lap chole  DVT Prophylaxis  :  Lovenox -  SCDs   Lab Results  Component Value Date   PLT 370 10/21/2015    Antibiotics  :  zosyn  Anti-infectives    Start     Dose/Rate Route Frequency Ordered Stop   10/16/15 0400  piperacillin-tazobactam (ZOSYN) IVPB 3.375 g    Comments:  Zosyn 3.375 g IV q8h for CrCl > 20 mL/min   3.375 g 12.5 mL/hr over 240 Minutes Intravenous Every 8 hours 10/16/15 0105     10/15/15 2100  piperacillin-tazobactam (ZOSYN) IVPB 3.375 g     3.375 g 100 mL/hr over 30 Minutes Intravenous  Once 10/15/15 2056 10/15/15 2131        Objective:   Vitals:   10/20/15 2224 10/21/15 0255 10/21/15 0438 10/21/15 0500  BP: (!) 172/83 (!) 156/82 (!) 172/85   Pulse: 79 79 86   Resp: 17 16 17    Temp: 97.7 F (36.5 C) 98.2 F (36.8 C) 98.3 F (36.8 C)   TempSrc: Oral Oral Oral   SpO2: 95% 95% 94%   Weight:    118.5 kg (261 lb 3.2 oz)  Height:        Wt Readings from Last 3 Encounters:  10/21/15 118.5 kg (261 lb 3.2 oz)  09/22/15 104.4 kg (230 lb 3.2 oz)  07/19/15 108.4 kg (239 lb)     Intake/Output Summary (Last 24 hours) at 10/21/15 1220 Last data filed at 10/20/15 2153   Gross per 24 hour  Intake          1169.25 ml  Output                0 ml  Net          1169.25 ml     Physical Exam  Awake Alert, Oriented X 3, No new F.N deficits, Normal affect Jenkins.AT,PERRAL Supple Neck,No JVD, No cervical lymphadenopathy appriciated.  Symmetrical Chest wall movement, Good air movement bilaterally, CTAB RRR,No Gallops,Rubs or new Murmurs, No Parasternal Heave +ve B.Sounds, Abd Soft, No tenderness, No organomegaly appriciated, No rebound - guarding or rigidity.  Incision c/d/i No Cyanosis, Clubbing or edema, No new Rash or bruise     Data Review:    CBC  Recent Labs Lab 10/15/15 1832 10/16/15 0634 10/17/15 0456 10/18/15 0534 10/20/15 0326 10/21/15 0445  WBC 13.2* 10.0 7.4 9.6 13.8* 14.2*  HGB 14.2 12.8 10.3* 11.5* 12.2 11.9*  HCT 43.6 41.2 33.3* 37.0 37.0 38.1  PLT 210 188 164 200 302 370  MCV 92.8 93.2 92.2 92.5 89.8 92.3  MCH 30.2 29.0 28.5 28.8 29.6 28.8  MCHC 32.6 31.1 30.9 31.1 33.0 31.2  RDW 13.4 13.6 14.0 14.1 14.4 14.7  LYMPHSABS 0.6* 0.9  --   --   --   --   MONOABS 0.9 0.6  --   --   --   --  EOSABS 0.0 0.0  --   --   --   --   BASOSABS 0.0 0.0  --   --   --   --     Chemistries   Recent Labs Lab 10/17/15 0456 10/18/15 0534 10/19/15 0308 10/20/15 0326 10/21/15 0445  NA 137 136 136 138 134*  K 3.0* 2.9* 3.4* 4.4 4.8  CL 108 104 103 105 100*  CO2 24 25 26 25 26   GLUCOSE 128* 131* 111* 115* 88  BUN 6 <5* <5* <5* 7  CREATININE 0.75 0.83 0.74 0.75 0.78  CALCIUM 7.6* 8.0* 8.4* 8.3* 8.5*  MG  --  1.7  --   --   --   AST 56* 57* 43* 24 40  ALT 55* 48 42 29 33  ALKPHOS 211* 197* 178* 153* 173*  BILITOT 1.2 1.1 0.9 0.8 1.0   ------------------------------------------------------------------------------------------------------------------ No results for input(s): CHOL, HDL, LDLCALC, TRIG, CHOLHDL, LDLDIRECT in the last 72 hours.  Lab Results  Component Value Date   HGBA1C 5.5 10/16/2015    ------------------------------------------------------------------------------------------------------------------ No results for input(s): TSH, T4TOTAL, T3FREE, THYROIDAB in the last 72 hours.  Invalid input(s): FREET3 ------------------------------------------------------------------------------------------------------------------ No results for input(s): VITAMINB12, FOLATE, FERRITIN, TIBC, IRON, RETICCTPCT in the last 72 hours.  Coagulation profile  Recent Labs Lab 10/16/15 0201  INR 1.30    No results for input(s): DDIMER in the last 72 hours.  Cardiac Enzymes  Recent Labs Lab 10/19/15 1051 10/19/15 1421  TROPONINI <0.03 <0.03   ------------------------------------------------------------------------------------------------------------------ No results found for: BNP  Inpatient Medications  Scheduled Meds: . acetaminophen  1,000 mg Oral TID  . DULoxetine  60 mg Oral Daily  . estradiol  0.5 mg Oral Daily  . feeding supplement (ENSURE ENLIVE)  237 mL Oral BID BM  . heparin  5,000 Units Subcutaneous Q8H  . insulin aspart  0-9 Units Subcutaneous Q4H  . levothyroxine  88 mcg Oral QAC breakfast  . lip balm   Topical BID  . montelukast  10 mg Oral QHS  . pantoprazole  40 mg Oral BID  . piperacillin-tazobactam (ZOSYN)  IV  3.375 g Intravenous Q8H  . polyethylene glycol  17 g Oral BID  . rosuvastatin  10 mg Oral QPM  . sodium chloride flush  3 mL Intravenous Q12H   Continuous Infusions: . 0.9 % NaCl with KCl 40 mEq / L 75 mL/hr (10/21/15 0427)   PRN Meds:.alum & mag hydroxide-simeth, bisacodyl, dextrose, HYDROcodone-acetaminophen, magic mouthwash, menthol-cetylpyridinium, methocarbamol (ROBAXIN)  IV, methocarbamol, morphine injection, ondansetron **OR** ondansetron (ZOFRAN) IV, phenol, polyethylene glycol, promethazine  Micro Results Recent Results (from the past 240 hour(s))  Culture, blood (Routine x 2)     Status: None   Collection Time: 10/15/15  6:32 PM   Result Value Ref Range Status   Specimen Description RIGHT ANTECUBITAL  Final   Special Requests BOTTLES DRAWN AEROBIC AND ANAEROBIC 10CC  Final   Culture NO GROWTH 5 DAYS  Final   Report Status 10/20/2015 FINAL  Final  Culture, blood (Routine x 2)     Status: None   Collection Time: 10/15/15  7:03 PM  Result Value Ref Range Status   Specimen Description RIGHT ANTECUBITAL  Final   Special Requests BOTTLES DRAWN AEROBIC AND ANAEROBIC  Final   Culture NO GROWTH 5 DAYS  Final   Report Status 10/20/2015 FINAL  Final  Urine culture     Status: None   Collection Time: 10/15/15  9:49 PM  Result Value Ref Range Status  Specimen Description URINE, CATHETERIZED  Final   Special Requests NONE  Final   Culture NO GROWTH  Final   Report Status 10/17/2015 FINAL  Final  Surgical PCR screen     Status: None   Collection Time: 10/16/15  2:00 AM  Result Value Ref Range Status   MRSA, PCR NEGATIVE NEGATIVE Final   Staphylococcus aureus NEGATIVE NEGATIVE Final    Comment:        The Xpert SA Assay (FDA approved for NASAL specimens in patients over 8 years of age), is one component of a comprehensive surveillance program.  Test performance has been validated by [facility] for patients greater than or equal to 68 year old. It is not intended to diagnose infection nor to guide or monitor treatment.     Radiology Reports Dg Chest 2 View  Result Date: 10/15/2015 CLINICAL DATA:  Fever, shortness of breath and chest pain for 1 day. EXAM: CHEST  2 VIEW COMPARISON:  CT chest and PA and lateral chest 01/30/2010. FINDINGS: The lungs are clear. Heart size is normal. No pneumothorax or pleural effusion. No focal bony abnormality. Surgical clips left upper quadrant of the abdomen are noted. IMPRESSION: No acute disease. Electronically Signed   By: Drusilla Kanner M.D.   On: 10/15/2015 19:09  Dg Cholangiogram Operative  Result Date: 10/17/2015 CLINICAL DATA:  Acute cholecystitis EXAM:  INTRAOPERATIVE CHOLANGIOGRAM TECHNIQUE: Cholangiographic images from the C-arm fluoroscopic device were submitted for interpretation post-operatively. Please see the procedural report for the amount of contrast and the fluoroscopy time utilized. COMPARISON:  10/16/2015 FINDINGS: The biliary confluence, common hepatic duct, residual cystic duct, and common bile duct are all patent. Contrast drains into the duodenum. No filling defect or obstruction. IMPRESSION: Patent biliary system. Electronically Signed   By: Judie Petit.  Shick M.D.   On: 10/17/2015 11:37  Ct Abdomen Pelvis W Contrast  Result Date: 10/15/2015 CLINICAL DATA:  Generalized abdominal pain and nausea. EXAM: CT ABDOMEN AND PELVIS WITH CONTRAST TECHNIQUE: Multidetector CT imaging of the abdomen and pelvis was performed using the standard protocol following bolus administration of intravenous contrast. CONTRAST:  ISOVUE-300 IOPAMIDOL (ISOVUE-300) INJECTION 61% COMPARISON:  None. FINDINGS: Lower chest: Limited visualization of the lower thorax demonstrates minimal dependent subpleural ground-glass atelectasis, right greater than left. No discrete focal airspace opacities. No pleural effusion. Normal heart size.  No pericardial effusion. Hepatobiliary: Normal hepatic contour. No discrete hepatic lesions. The gallbladder is distended with gallbladder wall thickening and minimal amount of associated pericholecystic fluid. These findings are associated with mild centralized intrahepatic biliary ductal dilatation and suspected periportal edema. No definite radiopaque gallstones. No ascites. Pancreas: Normal appearance of the pancreas. No pancreatic duct dilatation. No peripancreatic stranding. Spleen: Normal appearance of the spleen. No parent splenic stranding. Adrenals/Urinary Tract: There is symmetric enhancement and excretion of the bilateral kidneys. No definite renal stones this postcontrast examination. No discrete renal lesions. No urinary obstruction  or perinephric stranding. Normal appearance of the bilateral adrenal glands. Normal appearance of the urinary bladder given degree distention. Stomach/Bowel: Sequela of prior gastric bypass surgery. No evidence of enteric obstruction Vascular/Lymphatic: Moderate amount of mixed calcified and noncalcified atherosclerotic plaque within a normal caliber abdominal aorta. The major branch vessels of the abdominal aorta appear patent on this non CTA examination. No bulky retroperitoneal, mesenteric, pelvic or inguinal lymphadenopathy on this noncontrast examination. Reproductive: Post hysterectomy. No discrete adnexal lesion. No free fluid in the pelvic cul-de-sac. Other: Regional soft tissues appear normal. Musculoskeletal: Post right total hip replacement. Note  is made of an approximately 6.9 x 6.1 x 5.6 cm fluid collection about the anterior aspect of the right total hip prosthesis extending all the right iliacus musculature (representative axial image 86 and 72, series 21, coronal image 51, series 203). No discrete areas of osteolysis. IMPRESSION: 1. Findings worrisome for acute cholecystitis. Further evaluation nuclear medicine HIDA scan could be performed as clinically indicated. 2. Sequela of prior gastric bypass surgery without evidence of enteric obstruction. 3. Post right total hip replacement with a indeterminate fluid collection about the anterior aspect of the right total hip replacement extending to involve the right iliacus musculature. Clinical correlation is advised. 4. Sequela of prior gastric bypass surgery without evidence of enteric obstruction. Electronically Signed   By: Simonne Come M.D.   On: 10/15/2015 22:53  Mr Abdomen Mrcp Wo Cm  Result Date: 10/16/2015 CLINICAL DATA:  Four-day history of worsening abdominal pain common nausea, vomiting and fever. Abnormal CT scan. EXAM: MRI ABDOMEN WITHOUT CONTRAST  (INCLUDING MRCP) TECHNIQUE: Multiplanar multisequence MR imaging of the abdomen was  performed. Heavily T2-weighted images of the biliary and pancreatic ducts were obtained, and three-dimensional MRCP images were rendered by post processing. COMPARISON:  CT scan 10/15/2015 FINDINGS: Examination is quite limited due to respiratory motion. In particular, the MRCP images are nondiagnostic. Lower chest: Bibasilar atelectasis. No pleural effusions. The heart is normal in size. No pericardial effusion. Hepatobiliary: Fatty infiltration of the liver is noted. No focal hepatic lesions. Moderate periportal edema is noted. No intrahepatic biliary dilatation. No common bile duct dilatation is identified. The gallbladder demonstrates a markedly thickened wall measuring up to 11 mm. There is layering debris dependently in the gallbladder which is probably a combination of sludge and tiny stones. Moderate pericholecystic inflammatory changes and fluid highly suggestive of acute cholecystitis. No obvious common bile duct stones. Pancreas: No mass, inflammation or ductal dilatation. Spleen: Normal size.  No focal lesions. Adrenals/Urinary Tract: The adrenal glands and kidneys are unremarkable. Stomach/Bowel: The stomach demonstrates surgical changes from bypass surgery. The visualized small bowel and colon are grossly normal. Vascular/Lymphatic: No mesenteric or retroperitoneal mass or adenopathy. The aorta is normal in caliber. Moderate distal atherosclerotic calcifications. Other: Small volume of fluid around the liver and in the right pericolic gutter. Musculoskeletal: No significant bony findings. IMPRESSION: 1. Very limited MR examination due to patient motion. The MRCP images are quite limited. 2. MR findings consistent with acute cholecystitis. No obvious common bile duct dilatation or common bile duct stones. 3. Normal appearance of the pancreas in normal caliber pancreatic duct. 4. 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 Electronically Signed   By: Rudie Meyer M.D.   On: 10/16/2015 20:35  Mr 3d  Recon At Scanner  Result Date: 10/16/2015 CLINICAL DATA:  Four-day history of worsening abdominal pain common nausea, vomiting and fever. Abnormal CT scan. EXAM: MRI ABDOMEN WITHOUT CONTRAST  (INCLUDING MRCP) TECHNIQUE: Multiplanar multisequence MR imaging of the abdomen was performed. Heavily T2-weighted images of the biliary and pancreatic ducts were obtained, and three-dimensional MRCP images were rendered by post processing. COMPARISON:  CT scan 10/15/2015 FINDINGS: Examination is quite limited due to respiratory motion. In particular, the MRCP images are nondiagnostic. Lower chest: Bibasilar atelectasis. No pleural effusions. The heart is normal in size. No pericardial effusion. Hepatobiliary: Fatty infiltration of the liver is noted. No focal hepatic lesions. Moderate periportal edema is noted. No intrahepatic biliary dilatation. No common bile duct dilatation is identified. The gallbladder demonstrates a markedly thickened wall measuring up to 11  mm. There is layering debris dependently in the gallbladder which is probably a combination of sludge and tiny stones. Moderate pericholecystic inflammatory changes and fluid highly suggestive of acute cholecystitis. No obvious common bile duct stones. Pancreas: No mass, inflammation or ductal dilatation. Spleen: Normal size.  No focal lesions. Adrenals/Urinary Tract: The adrenal glands and kidneys are unremarkable. Stomach/Bowel: The stomach demonstrates surgical changes from bypass surgery. The visualized small bowel and colon are grossly normal. Vascular/Lymphatic: No mesenteric or retroperitoneal mass or adenopathy. The aorta is normal in caliber. Moderate distal atherosclerotic calcifications. Other: Small volume of fluid around the liver and in the right pericolic gutter. Musculoskeletal: No significant bony findings. IMPRESSION: 1. Very limited MR examination due to patient motion. The MRCP images are quite limited. 2. MR findings consistent with acute  cholecystitis. No obvious common bile duct dilatation or common bile duct stones. 3. Normal appearance of the pancreas in normal caliber pancreatic duct. 4. 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 Electronically Signed   By: Rudie Meyer M.D.   On: 10/16/2015 20:35  Dg Abd Portable 1v  Result Date: 10/19/2015 CLINICAL DATA:  Nausea and vomiting before and post cholecystectomy 10/17/2015. EXAM: PORTABLE ABDOMEN - 1 VIEW COMPARISON:  CT 10/15/2015 FINDINGS: Examination demonstrates air throughout the colon. There are several air-filled dilated small bowel loops measuring 4.1 cm in diameter over the left abdomen. No definite free peritoneal air. Multiple surgical clips over the left abdomen as well as right upper quadrant. Surgical suture line over the stomach. 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 IMPRESSION: Air-filled dilated small bowel loops in the left abdomen measuring 4.1 cm in diameter with air throughout the colon. Findings likely due to postoperative ileus, however small bowel obstructive process not excluded. Recommend serial follow-up abdominal films as clinically indicated. Electronically Signed   By: Elberta Fortis M.D.   On: 10/19/2015 10:18  US Abdomen Limited Ruq  Result Date: 10/15/2015 CLINICAL DATA:  65 year old female with generalized abdominal pain and fever. EXAM: US ABDOMEN LIMITED - RIGHT UPPER QUADRANT COMPARISON:  None. FINDINGS: Gallbladder: There is sludge within the gallbladder. No significant gallbladder wall thickening or pericholecystic fluid. There is slight indistinctness of the gallbladder wall which may be technical. Mild edema is not excluded. Negative sonographic Murphy's sign. Common bile duct: Diameter: 6 mm Liver: There is mild diffuse increased hepatic echogenicity likely fatty infiltration. IMPRESSION: Gallbladder sludge. No definite sonographic evidence of acute cholecystitis. A hepatobiliary scintigraphy  may provide better evaluation of the gallbladder if an acute cholecystitis is clinically suspected. Fatty liver. Electronically Signed   By: Elgie Collard M.D.   On: 10/15/2015 21:01   Time Spent in minutes  30   Pearson Grippe M.D on 10/21/2015 at 12:20 PM  Between 7am to 7pm - Pager - [phone]  After 7pm go to [email or web address] - password [facility]  [facility] -  Office  [phone]

## 2015-10-22 DIAGNOSIS — K5660 Unspecified intestinal obstruction: Secondary | ICD-10-CM

## 2015-10-22 LAB — COMPREHENSIVE METABOLIC PANEL
ALT: 30 U/L (ref 14–54)
AST: 36 U/L (ref 15–41)
Albumin: 2.1 g/dL — ABNORMAL LOW (ref 3.5–5.0)
Alkaline Phosphatase: 168 U/L — ABNORMAL HIGH (ref 38–126)
Anion gap: 7 (ref 5–15)
BUN: 6 mg/dL (ref 6–20)
CHLORIDE: 101 mmol/L (ref 101–111)
CO2: 26 mmol/L (ref 22–32)
CREATININE: 0.81 mg/dL (ref 0.44–1.00)
Calcium: 8.2 mg/dL — ABNORMAL LOW (ref 8.9–10.3)
GFR calc non Af Amer: >60 mL/min (ref >60)
Glucose, Bld: 76 mg/dL (ref 65–99)
POTASSIUM: 3.8 mmol/L (ref 3.5–5.1)
SODIUM: 134 mmol/L — AB (ref 135–145)
Total Bilirubin: 0.8 mg/dL (ref 0.3–1.2)
Total Protein: 5.4 g/dL — ABNORMAL LOW (ref 6.5–8.1)

## 2015-10-22 LAB — CBC
HCT: 36.2 % (ref 36.0–46.0)
Hemoglobin: 11.5 g/dL — ABNORMAL LOW (ref 12.0–15.0)
MCH: 28.8 pg (ref 26.0–34.0)
MCHC: 31.8 g/dL (ref 30.0–36.0)
MCV: 90.7 fL (ref 78.0–100.0)
PLATELETS: 377 10*3/uL (ref 150–400)
RBC: 3.99 MIL/uL (ref 3.87–5.11)
RDW: 14.5 % (ref 11.5–15.5)
WBC: 12 10*3/uL — AB (ref 4.0–10.5)

## 2015-10-22 LAB — GLUCOSE, CAPILLARY
GLUCOSE-CAPILLARY: 80 mg/dL (ref 65–99)
GLUCOSE-CAPILLARY: 70 mg/dL (ref 65–99)
Glucose-Capillary: 84 mg/dL (ref 65–99)
Glucose-Capillary: 89 mg/dL (ref 65–99)

## 2015-10-22 MED ORDER — DIPHENHYDRAMINE HCL 25 MG PO CAPS
25.0000 mg | ORAL_CAPSULE | Freq: Once | ORAL | Status: AC
  Administered 2015-10-22: 25 mg via ORAL
  Filled 2015-10-22: qty 1

## 2015-10-22 NOTE — Progress Notes (Signed)
5 Days Post-Op  Subjective: Feels better with NGT, no flatus  Objective: Vital signs in last 24 hours: Temp:  [98.1 F (36.7 C)-98.8 F (37.1 C)] 98.1 F (36.7 C) (07/30 0510) Pulse Rate:  [82-84] 84 (07/30 0510) Resp:  [18-19] 19 (07/30 0510) BP: (153-177)/(66-86) 177/70 (07/30 0510) SpO2:  [95 %-100 %] 95 % (07/30 0510) Last BM Date: 10/20/15  Intake/Output from previous day: 07/29 0701 - 07/30 0700 In: -  Out: 1000 [Emesis/NG output:1000] Intake/Output this shift: No intake/output data recorded.  GI: distended but soft, a few BS, incisions CDI  Lab Results:   Recent Labs  10/21/15 0445 10/22/15 0353  WBC 14.2* 12.0*  HGB 11.9* 11.5*  HCT 38.1 36.2  PLT 370 377   BMET  Recent Labs  10/21/15 0445 10/22/15 0353  NA 134* 134*  K 4.8 3.8  CL 100* 101  CO2 26 26  GLUCOSE 88 76  BUN 7 6  CREATININE 0.78 0.81  CALCIUM 8.5* 8.2*   PT/INR No results for input(s): LABPROT, INR in the last 72 hours. ABG No results for input(s): PHART, HCO3 in the last 72 hours.  Invalid input(s): PCO2, PO2  Studies/Results: Dg Abd 2 Views  Result Date: 10/21/2015 CLINICAL DATA:  Recent cholecystectomy. EXAM: ABDOMEN - 2 VIEW COMPARISON:  October 19, 2015 FINDINGS: Increasing gastric distention with an air-fluid level on the upright view. Multiple dilated loops of small bowel. The small bowel is dilated out of proportion to the colon. Minimal air in the rectum. No free air, portal venous gas, or pneumatosis. IMPRESSION: Increasing gastric distention with small bowel dilatation out of proportion to colonic caliber is most consistent with a small bowel obstruction. Electronically Signed   By: Gerome Sam III M.D   On: 10/21/2015 16:03   Anti-infectives: Anti-infectives    Start     Dose/Rate Route Frequency Ordered Stop   10/16/15 0400  piperacillin-tazobactam (ZOSYN) IVPB 3.375 g    Comments:  Zosyn 3.375 g IV q8h for CrCl > 20 mL/min   3.375 g 12.5 mL/hr over 240 Minutes  Intravenous Every 8 hours 10/16/15 0105     10/15/15 2100  piperacillin-tazobactam (ZOSYN) IVPB 3.375 g     3.375 g 100 mL/hr over 30 Minutes Intravenous  Once 10/15/15 2056 10/15/15 2131      Assessment/Plan: s/p Procedure(s): LAPAROSCOPIC CHOLECYSTECTOMY WITH INTRAOPERATIVE CHOLANGIOGRAM (N/A) POD#5 Ileus - continue NGT and await bowel function. Labs OK.  LOS: 6 days    Maddalyn Lutze E 10/22/2015

## 2015-10-22 NOTE — Progress Notes (Signed)
Patient ID: AVRIANA JOO, female   DOB: 03-13-1951, 65 y.o.   MRN: [id number]                                                                PROGRESS NOTE                                                                                                                                                                                                             Patient Demographics:    Haley Larson, is a 65 y.o. female, DOB - 1951-02-29, WUJ:[id number]  Admit date - 10/15/2015   Admitting Physician Briscoe Deutscher, MD  Outpatient Primary MD for the patient is WHITE, MARSHA L, NP  LOS - 6  Outpatient Specialists:    Chief Complaint  Patient presents with  . Emesis  . Nausea       Brief Narrative  65 y.o.femalewith medical history significant for morbid obesity status post open gastric bypass in the 1970s, arthritis with chronic pain, hypothyroidism, and hyperlipidemia who presents the emergency department for evaluation of 4 days of worsening abdominal pain, nausea, and vomiting with development of fever. Now has Acute cholecystitis - possible common bile duct obstruction with increasing bilirubin    Subjective:    Haley Larson today has been feeling better in terms of her abdominal distension.   No headache, No chest pain, No abdominal pain - No Nausea, No new weakness tingling or numbness, No Cough - SOB.    Assessment  & Plan :    Principal Problem:   Acute cholecystitis s/p lap chole 10/17/2015 Active Problems:   Hyperlipidemia   Status post THR (total hip replacement)   History of gastric bypass   Hypothyroidism   GERD (gastroesophageal reflux disease)   Hyponatremia   Hyperglycemia   Sepsis (HCC)   Acute cholangitis   Elective surgical procedure   Elevated LFTs   Hypokalemia   Abdominal distension   Cholelithiasis with acute cholangitis   Ileus (HCC)  Cholelithiasis, acute cholecystitis, S/p Lap cholecystectomy 10/17/2015 Future GI follow up with Dr Mann/Hung,  surgery  Ileus  Vs SBO NGT in place Check abd xray tomorrow  Hypokalemia resolved Check cmp in am  Sepsis-improving, likely secondary to #1 No growth so far on urine culture, blood culture Continue Zosyn  Hyponatremia  improved  GERD  Cont protoinx  Hypothyroidism  Appears to be stable  Cont Synthroid   Arthritis with chronic pain  - Stable, managed with APAP and prn Norco at home  - Currently controlling abd pain with fentanyl injections   Hyperglycemia  Hemoglobin A1c 5.5 - Serum glucose 235 on admission, possibly secondary to sepsis; no hx of DM  - Check CBG q4h for now; low-in  Protein calorie malnutrition Severe cont on ensure     Code Status : FULL CODE  Family Communication  :   Disposition Plan  : home  Barriers For Discharge :   Consults  :  surgery  Procedures  : lap chole  DVT Prophylaxis  :  Lovenox - Heparin - SCDs   Lab Results  Component Value Date   PLT 377 10/22/2015    Antibiotics  :    Anti-infectives    Start     Dose/Rate Route Frequency Ordered Stop   10/16/15 0400  piperacillin-tazobactam (ZOSYN) IVPB 3.375 g    Comments:  Zosyn 3.375 g IV q8h for CrCl > 20 mL/min   3.375 g 12.5 mL/hr over 240 Minutes Intravenous Every 8 hours 10/16/15 0105     10/15/15 2100  piperacillin-tazobactam (ZOSYN) IVPB 3.375 g     3.375 g 100 mL/hr over 30 Minutes Intravenous  Once 10/15/15 2056 10/15/15 2131        Objective:   Vitals:   10/21/15 1312 10/21/15 2056 10/22/15 0510 10/22/15 1300  BP: (!) 167/86 (!) 153/66 (!) 177/70 (!) 148/71  Pulse: 82 84 84 76  Resp: 18 19 19    Temp: 98.8 F (37.1 C) 98.2 F (36.8 C) 98.1 F (36.7 C) 98.8 F (37.1 C)  TempSrc: Oral Oral Oral Oral  SpO2: 100% 95% 95% 93%  Weight:      Height:        Wt Readings from Last 3 Encounters:  10/21/15 118.5 kg (261 lb 3.2 oz)  09/22/15 104.4 kg (230 lb 3.2 oz)  07/19/15 108.4 kg (239 lb)     Intake/Output Summary (Last 24 hours) at  10/22/15 1412 Last data filed at 10/21/15 1700  Gross per 24 hour  Intake                0 ml  Output             1000 ml  Net            -1000 ml     Physical Exam  Awake Alert, Oriented X 3, No new F.N deficits, Normal affect [place].AT,PERRAL Supple Neck,No JVD, No cervical lymphadenopathy appriciated.  Symmetrical Chest wall movement, Good air movement bilaterally, CTAB RRR,No Gallops,Rubs or new Murmurs, No Parasternal Heave +ve B.Sounds, Abd Soft, No tenderness, No organomegaly appriciated, No rebound - guarding or rigidity. No Cyanosis, Clubbing or edema, No new Rash or bruise  NGT in place    Data Review:    CBC  Recent Labs Lab 10/15/15 1832 10/16/15 4098 10/17/15 0456 10/18/15 0534 10/20/15 0326 10/21/15 0445 10/22/15 0353  WBC 13.2* 10.0 7.4 9.6 13.8* 14.2* 12.0*  HGB 14.2 12.8 10.3* 11.5* 12.2 11.9* 11.5*  HCT 43.6 41.2 33.3* 37.0 37.0 38.1 36.2  PLT 210 188 164 200 302 370 377  MCV 92.8 93.2 92.2 92.5 89.8 92.3 90.7  MCH 30.2 29.0 28.5 28.8 29.6 28.8 28.8  MCHC 32.6 31.1 30.9 31.1 33.0 31.2 31.8  RDW 13.4 13.6 14.0 14.1 14.4 14.7 14.5  LYMPHSABS 0.6* 0.9  --   --   --   --   --   MONOABS 0.9 0.6  --   --   --   --   --   EOSABS 0.0 0.0  --   --   --   --   --   BASOSABS 0.0 0.0  --   --   --   --   --     Chemistries   Recent Labs Lab 10/18/15 0534 10/19/15 0308 10/20/15 0326 10/21/15 0445 10/22/15 0353  NA 136 136 138 134* 134*  K 2.9* 3.4* 4.4 4.8 3.8  CL 104 103 105 100* 101  CO2 25 26 25 26 26   GLUCOSE 131* 111* 115* 88 76  BUN <5* <5* <5* 7 6  CREATININE 0.83 0.74 0.75 0.78 0.81  CALCIUM 8.0* 8.4* 8.3* 8.5* 8.2*  MG 1.7  --   --   --   --   AST 57* 43* 24 40 36  ALT 48 42 29 33 30  ALKPHOS 197* 178* 153* 173* 168*  BILITOT 1.1 0.9 0.8 1.0 0.8   ------------------------------------------------------------------------------------------------------------------ No results for input(s): CHOL, HDL, LDLCALC, TRIG, CHOLHDL, LDLDIRECT in  the last 72 hours.  Lab Results  Component Value Date   HGBA1C 5.5 10/16/2015   ------------------------------------------------------------------------------------------------------------------ No results for input(s): TSH, T4TOTAL, T3FREE, THYROIDAB in the last 72 hours.  Invalid input(s): FREET3 ------------------------------------------------------------------------------------------------------------------ No results for input(s): VITAMINB12, FOLATE, FERRITIN, TIBC, IRON, RETICCTPCT in the last 72 hours.  Coagulation profile  Recent Labs Lab 10/16/15 0201  INR 1.30    No results for input(s): DDIMER in the last 72 hours.  Cardiac Enzymes  Recent Labs Lab 10/19/15 1051 10/19/15 1421  TROPONINI <0.03 <0.03   ------------------------------------------------------------------------------------------------------------------ No results found for: BNP  Inpatient Medications  Scheduled Meds: . acetaminophen  1,000 mg Oral TID  . DULoxetine  60 mg Oral Daily  . estradiol  0.5 mg Oral Daily  . feeding supplement (ENSURE ENLIVE)  237 mL Oral BID BM  . heparin  5,000 Units Subcutaneous Q8H  . insulin aspart  0-9 Units Subcutaneous Q4H  . levothyroxine  88 mcg Oral QAC breakfast  . lip balm   Topical BID  . montelukast  10 mg Oral QHS  . pantoprazole  40 mg Oral BID  . piperacillin-tazobactam (ZOSYN)  IV  3.375 g Intravenous Q8H  . polyethylene glycol  17 g Oral BID  . rosuvastatin  10 mg Oral QPM  . sodium chloride flush  3 mL Intravenous Q12H   Continuous Infusions: . 0.9 % NaCl with KCl 40 mEq / L 100 mL/hr (10/22/15 1242)   PRN Meds:.alum & mag hydroxide-simeth, bisacodyl, dextrose, HYDROcodone-acetaminophen, magic mouthwash, menthol-cetylpyridinium, methocarbamol (ROBAXIN)  IV, methocarbamol, morphine injection, ondansetron **OR** ondansetron (ZOFRAN) IV, phenol, polyethylene glycol, promethazine  Micro Results Recent Results (from the past 240 hour(s))    Culture, blood (Routine x 2)     Status: None   Collection Time: 10/15/15  6:32 PM  Result Value Ref Range Status   Specimen Description RIGHT ANTECUBITAL  Final   Special Requests BOTTLES DRAWN AEROBIC AND ANAEROBIC 10CC  Final   Culture NO GROWTH 5 DAYS  Final   Report Status 10/20/2015 FINAL  Final  Culture, blood (Routine x 2)     Status: None   Collection Time: 10/15/15  7:03 PM  Result Value Ref Range Status   Specimen Description RIGHT ANTECUBITAL  Final   Special Requests BOTTLES DRAWN AEROBIC  AND ANAEROBIC  Final   Culture NO GROWTH 5 DAYS  Final   Report Status 10/20/2015 FINAL  Final  Urine culture     Status: None   Collection Time: 10/15/15  9:49 PM  Result Value Ref Range Status   Specimen Description URINE, CATHETERIZED  Final   Special Requests NONE  Final   Culture NO GROWTH  Final   Report Status 10/17/2015 FINAL  Final  Surgical PCR screen     Status: None   Collection Time: 10/16/15  2:00 AM  Result Value Ref Range Status   MRSA, PCR NEGATIVE NEGATIVE Final   Staphylococcus aureus NEGATIVE NEGATIVE Final    Comment:        The Xpert SA Assay (FDA approved for NASAL specimens in patients over 73 years of age), is one component of a comprehensive surveillance program.  Test performance has been validated by [facility] for patients greater than or equal to 56 year old. It is not intended to diagnose infection nor to guide or monitor treatment.     Radiology Reports Dg Chest 2 View  Result Date: 10/15/2015 CLINICAL DATA:  Fever, shortness of breath and chest pain for 1 day. EXAM: CHEST  2 VIEW COMPARISON:  CT chest and PA and lateral chest 01/30/2010. FINDINGS: The lungs are clear. Heart size is normal. No pneumothorax or pleural effusion. No focal bony abnormality. Surgical clips left upper quadrant of the abdomen are noted. IMPRESSION: No acute disease. Electronically Signed   By: Drusilla Kanner M.D.   On: 10/15/2015 19:09  Dg Cholangiogram  Operative  Result Date: 10/17/2015 CLINICAL DATA:  Acute cholecystitis EXAM: INTRAOPERATIVE CHOLANGIOGRAM TECHNIQUE: Cholangiographic images from the C-arm fluoroscopic device were submitted for interpretation post-operatively. Please see the procedural report for the amount of contrast and the fluoroscopy time utilized. COMPARISON:  10/16/2015 FINDINGS: The biliary confluence, common hepatic duct, residual cystic duct, and common bile duct are all patent. Contrast drains into the duodenum. No filling defect or obstruction. IMPRESSION: Patent biliary system. Electronically Signed   By: Judie Petit.  Shick M.D.   On: 10/17/2015 11:37  Ct Abdomen Pelvis W Contrast  Result Date: 10/15/2015 CLINICAL DATA:  Generalized abdominal pain and nausea. EXAM: CT ABDOMEN AND PELVIS WITH CONTRAST TECHNIQUE: Multidetector CT imaging of the abdomen and pelvis was performed using the standard protocol following bolus administration of intravenous contrast. CONTRAST:  ISOVUE-300 IOPAMIDOL (ISOVUE-300) INJECTION 61% COMPARISON:  None. FINDINGS: Lower chest: Limited visualization of the lower thorax demonstrates minimal dependent subpleural ground-glass atelectasis, right greater than left. No discrete focal airspace opacities. No pleural effusion. Normal heart size.  No pericardial effusion. Hepatobiliary: Normal hepatic contour. No discrete hepatic lesions. The gallbladder is distended with gallbladder wall thickening and minimal amount of associated pericholecystic fluid. These findings are associated with mild centralized intrahepatic biliary ductal dilatation and suspected periportal edema. No definite radiopaque gallstones. No ascites. Pancreas: Normal appearance of the pancreas. No pancreatic duct dilatation. No peripancreatic stranding. Spleen: Normal appearance of the spleen. No parent splenic stranding. Adrenals/Urinary Tract: There is symmetric enhancement and excretion of the bilateral kidneys. No definite renal stones  this postcontrast examination. No discrete renal lesions. No urinary obstruction or perinephric stranding. Normal appearance of the bilateral adrenal glands. Normal appearance of the urinary bladder given degree distention. Stomach/Bowel: Sequela of prior gastric bypass surgery. No evidence of enteric obstruction Vascular/Lymphatic: Moderate amount of mixed calcified and noncalcified atherosclerotic plaque within a normal caliber abdominal aorta. The major branch vessels of the abdominal  aorta appear patent on this non CTA examination. No bulky retroperitoneal, mesenteric, pelvic or inguinal lymphadenopathy on this noncontrast examination. Reproductive: Post hysterectomy. No discrete adnexal lesion. No free fluid in the pelvic cul-de-sac. Other: Regional soft tissues appear normal. Musculoskeletal: Post right total hip replacement. Note is made of an approximately 6.9 x 6.1 x 5.6 cm fluid collection about the anterior aspect of the right total hip prosthesis extending all the right iliacus musculature (representative axial image 86 and 72, series 21, coronal image 51, series 203). No discrete areas of osteolysis. IMPRESSION: 1. Findings worrisome for acute cholecystitis. Further evaluation nuclear medicine HIDA scan could be performed as clinically indicated. 2. Sequela of prior gastric bypass surgery without evidence of enteric obstruction. 3. Post right total hip replacement with a indeterminate fluid collection about the anterior aspect of the right total hip replacement extending to involve the right iliacus musculature. Clinical correlation is advised. 4. Sequela of prior gastric bypass surgery without evidence of enteric obstruction. Electronically Signed   By: Simonne Come M.D.   On: 10/15/2015 22:53  Mr Abdomen Mrcp Wo Cm  Result Date: 10/16/2015 CLINICAL DATA:  Four-day history of worsening abdominal pain common nausea, vomiting and fever. Abnormal CT scan. EXAM: MRI ABDOMEN WITHOUT CONTRAST  (INCLUDING  MRCP) TECHNIQUE: Multiplanar multisequence MR imaging of the abdomen was performed. Heavily T2-weighted images of the biliary and pancreatic ducts were obtained, and three-dimensional MRCP images were rendered by post processing. COMPARISON:  CT scan 10/15/2015 FINDINGS: Examination is quite limited due to respiratory motion. In particular, the MRCP images are nondiagnostic. Lower chest: Bibasilar atelectasis. No pleural effusions. The heart is normal in size. No pericardial effusion. Hepatobiliary: Fatty infiltration of the liver is noted. No focal hepatic lesions. Moderate periportal edema is noted. No intrahepatic biliary dilatation. No common bile duct dilatation is identified. The gallbladder demonstrates a markedly thickened wall measuring up to 11 mm. There is layering debris dependently in the gallbladder which is probably a combination of sludge and tiny stones. Moderate pericholecystic inflammatory changes and fluid highly suggestive of acute cholecystitis. No obvious common bile duct stones. Pancreas: No mass, inflammation or ductal dilatation. Spleen: Normal size.  No focal lesions. Adrenals/Urinary Tract: The adrenal glands and kidneys are unremarkable. Stomach/Bowel: The stomach demonstrates surgical changes from bypass surgery. The visualized small bowel and colon are grossly normal. Vascular/Lymphatic: No mesenteric or retroperitoneal mass or adenopathy. The aorta is normal in caliber. Moderate distal atherosclerotic calcifications. Other: Small volume of fluid around the liver and in the right pericolic gutter. Musculoskeletal: No significant bony findings. IMPRESSION: 1. Very limited MR examination due to patient motion. The MRCP images are quite limited. 2. MR findings consistent with acute cholecystitis. No obvious common bile duct dilatation or common bile duct stones. 3. Normal appearance of the pancreas in normal caliber pancreatic duct. 4. 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  Electronically Signed   By: Rudie Meyer M.D.   On: 10/16/2015 20:35  Mr 3d Recon At Scanner  Result Date: 10/16/2015 CLINICAL DATA:  Four-day history of worsening abdominal pain common nausea, vomiting and fever. Abnormal CT scan. EXAM: MRI ABDOMEN WITHOUT CONTRAST  (INCLUDING MRCP) TECHNIQUE: Multiplanar multisequence MR imaging of the abdomen was performed. Heavily T2-weighted images of the biliary and pancreatic ducts were obtained, and three-dimensional MRCP images were rendered by post processing. COMPARISON:  CT scan 10/15/2015 FINDINGS: Examination is quite limited due to respiratory motion. In particular, the MRCP images are nondiagnostic. Lower chest: Bibasilar atelectasis. No pleural effusions. The  heart is normal in size. No pericardial effusion. Hepatobiliary: Fatty infiltration of the liver is noted. No focal hepatic lesions. Moderate periportal edema is noted. No intrahepatic biliary dilatation. No common bile duct dilatation is identified. The gallbladder demonstrates a markedly thickened wall measuring up to 11 mm. There is layering debris dependently in the gallbladder which is probably a combination of sludge and tiny stones. Moderate pericholecystic inflammatory changes and fluid highly suggestive of acute cholecystitis. No obvious common bile duct stones. Pancreas: No mass, inflammation or ductal dilatation. Spleen: Normal size.  No focal lesions. Adrenals/Urinary Tract: The adrenal glands and kidneys are unremarkable. Stomach/Bowel: The stomach demonstrates surgical changes from bypass surgery. The visualized small bowel and colon are grossly normal. Vascular/Lymphatic: No mesenteric or retroperitoneal mass or adenopathy. The aorta is normal in caliber. Moderate distal atherosclerotic calcifications. Other: Small volume of fluid around the liver and in the right pericolic gutter. Musculoskeletal: No significant bony findings. IMPRESSION: 1. Very limited MR examination due to patient  motion. The MRCP images are quite limited. 2. MR findings consistent with acute cholecystitis. No obvious common bile duct dilatation or common bile duct stones. 3. Normal appearance of the pancreas in normal caliber pancreatic duct. 4. 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 Electronically Signed   By: Rudie Meyer M.D.   On: 10/16/2015 20:35  Dg Abd 2 Views  Result Date: 10/21/2015 CLINICAL DATA:  Recent cholecystectomy. EXAM: ABDOMEN - 2 VIEW COMPARISON:  October 19, 2015 FINDINGS: Increasing gastric distention with an air-fluid level on the upright view. Multiple dilated loops of small bowel. The small bowel is dilated out of proportion to the colon. Minimal air in the rectum. No free air, portal venous gas, or pneumatosis. IMPRESSION: Increasing gastric distention with small bowel dilatation out of proportion to colonic caliber is most consistent with a small bowel obstruction. Electronically Signed   By: Gerome Sam III M.D   On: 10/21/2015 16:03  Dg Abd Portable 1v  Result Date: 10/19/2015 CLINICAL DATA:  Nausea and vomiting before and post cholecystectomy 10/17/2015. EXAM: PORTABLE ABDOMEN - 1 VIEW COMPARISON:  CT 10/15/2015 FINDINGS: Examination demonstrates air throughout the colon. There are several air-filled dilated small bowel loops measuring 4.1 cm in diameter over the left abdomen. No definite free peritoneal air. Multiple surgical clips over the left abdomen as well as right upper quadrant. Surgical suture line over the stomach. 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 IMPRESSION: Air-filled dilated small bowel loops in the left abdomen measuring 4.1 cm in diameter with air throughout the colon. Findings likely due to postoperative ileus, however small bowel obstructive process not excluded. Recommend serial follow-up abdominal films as clinically indicated. Electronically Signed   By: Elberta Fortis M.D.   On: 10/19/2015 10:18  US  Abdomen Limited Ruq  Result Date: 10/15/2015 CLINICAL DATA:  65 year old female with generalized abdominal pain and fever. EXAM: US ABDOMEN LIMITED - RIGHT UPPER QUADRANT COMPARISON:  None. FINDINGS: Gallbladder: There is sludge within the gallbladder. No significant gallbladder wall thickening or pericholecystic fluid. There is slight indistinctness of the gallbladder wall which may be technical. Mild edema is not excluded. Negative sonographic Murphy's sign. Common bile duct: Diameter: 6 mm Liver: There is mild diffuse increased hepatic echogenicity likely fatty infiltration. IMPRESSION: Gallbladder sludge. No definite sonographic evidence of acute cholecystitis. A hepatobiliary scintigraphy may provide better evaluation of the gallbladder if an acute cholecystitis is clinically suspected. Fatty liver. Electronically Signed   By: Burtis Junes  Radparvar M.D.   On: 10/15/2015 21:01   Time Spent in minutes  30   Pearson Grippe M.D on 10/22/2015 at 2:12 PM  Between 7am to 7pm - Pager - [phone]  After 7pm go to [email or web address] - password [facility]  [facility] -  Office  [phone]

## 2015-10-23 ENCOUNTER — Inpatient Hospital Stay (HOSPITAL_COMMUNITY): Payer: Managed Care, Other (non HMO)

## 2015-10-23 DIAGNOSIS — K560 Paralytic ileus: Secondary | ICD-10-CM

## 2015-10-23 LAB — BASIC METABOLIC PANEL
Anion gap: 15 (ref 5–15)
BUN: 7 mg/dL (ref 6–20)
CALCIUM: 8.2 mg/dL — AB (ref 8.9–10.3)
CO2: 22 mmol/L (ref 22–32)
CREATININE: 0.82 mg/dL (ref 0.44–1.00)
Chloride: 97 mmol/L — ABNORMAL LOW (ref 101–111)
GFR calc Af Amer: >60 mL/min (ref >60)
GLUCOSE: 78 mg/dL (ref 65–99)
Potassium: 3.8 mmol/L (ref 3.5–5.1)
Sodium: 134 mmol/L — ABNORMAL LOW (ref 135–145)

## 2015-10-23 LAB — CBC
HCT: 36.6 % (ref 36.0–46.0)
HEMOGLOBIN: 11.5 g/dL — AB (ref 12.0–15.0)
MCH: 28.6 pg (ref 26.0–34.0)
MCHC: 31.4 g/dL (ref 30.0–36.0)
MCV: 91 fL (ref 78.0–100.0)
Platelets: 376 10*3/uL (ref 150–400)
RBC: 4.02 MIL/uL (ref 3.87–5.11)
RDW: 14.5 % (ref 11.5–15.5)
WBC: 12.5 10*3/uL — ABNORMAL HIGH (ref 4.0–10.5)

## 2015-10-23 LAB — COMPREHENSIVE METABOLIC PANEL
ALK PHOS: 172 U/L — AB (ref 38–126)
ALT: 39 U/L (ref 14–54)
ANION GAP: 11 (ref 5–15)
AST: 48 U/L — ABNORMAL HIGH (ref 15–41)
Albumin: 2.4 g/dL — ABNORMAL LOW (ref 3.5–5.0)
BILIRUBIN TOTAL: 1 mg/dL (ref 0.3–1.2)
BUN: 7 mg/dL (ref 6–20)
CALCIUM: 8 mg/dL — AB (ref 8.9–10.3)
CO2: 23 mmol/L (ref 22–32)
CREATININE: 0.8 mg/dL (ref 0.44–1.00)
Chloride: 100 mmol/L — ABNORMAL LOW (ref 101–111)
Glucose, Bld: 67 mg/dL (ref 65–99)
Potassium: 4.2 mmol/L (ref 3.5–5.1)
SODIUM: 134 mmol/L — AB (ref 135–145)
TOTAL PROTEIN: 5.4 g/dL — AB (ref 6.5–8.1)

## 2015-10-23 LAB — GLUCOSE, CAPILLARY
GLUCOSE-CAPILLARY: 71 mg/dL (ref 65–99)
GLUCOSE-CAPILLARY: 73 mg/dL (ref 65–99)
GLUCOSE-CAPILLARY: 77 mg/dL (ref 65–99)
Glucose-Capillary: 75 mg/dL (ref 65–99)
Glucose-Capillary: 102 mg/dL — ABNORMAL HIGH (ref 65–99)
Glucose-Capillary: 110 mg/dL — ABNORMAL HIGH (ref 65–99)

## 2015-10-23 MED ORDER — DIPHENHYDRAMINE HCL 25 MG PO CAPS
25.0000 mg | ORAL_CAPSULE | Freq: Every evening | ORAL | Status: DC | PRN
  Administered 2015-10-23: 25 mg via ORAL
  Filled 2015-10-23: qty 1

## 2015-10-23 MED ORDER — INSULIN ASPART 100 UNIT/ML ~~LOC~~ SOLN: 0.0000 [IU] | Freq: Three times a day (TID) | SUBCUTANEOUS | Status: DC

## 2015-10-23 MED ORDER — BISACODYL 10 MG RE SUPP
10.0000 mg | Freq: Every day | RECTAL | Status: DC
  Administered 2015-10-23: 10 mg via RECTAL
  Filled 2015-10-23 (×2): qty 1

## 2015-10-23 MED ORDER — POTASSIUM CHLORIDE IN NACL 20-0.9 MEQ/L-% IV SOLN
INTRAVENOUS | Status: DC
  Administered 2015-10-23 – 2015-10-24 (×2): via INTRAVENOUS
  Filled 2015-10-23 (×2): qty 1000

## 2015-10-23 NOTE — Progress Notes (Signed)
[facility] PROGRESS NOTE  Haley Larson WUJ:[id number] DOB: [birth year] DOA: 10/15/2015   PCP: April Manson, NP     Assessment/Plan: Principal Problem:   Acute cholecystitis s/p lap chole 10/17/2015 Active Problems:   Hyperlipidemia   Status post THR (total hip replacement)   History of gastric bypass   Hypothyroidism   GERD (gastroesophageal reflux disease)   Hyponatremia   Hyperglycemia   Sepsis (HCC)   Acute cholangitis   Elective surgical procedure   Elevated LFTs   Hypokalemia   Abdominal distension   Cholelithiasis with acute cholangitis   Bowel obstruction (HCC)   65 y.o.femalewith medical history significant for morbid obesity status post open gastric bypass in the 1970s, arthritis with chronic pain, hypothyroidism, and hyperlipidemia who presents the emergency department for evaluation of 4 days of worsening abdominal pain, nausea, and vomiting with development of fever. Now has Acute cholecystitis - possible common bile duct obstruction with increasing bilirubin. Patient status post laparoscopic cholecystectomy on 7/25 now with postoperative ileus  Assessment and plan Cholelithiasis, acute cholecystitis,    MRCP  without   strong evid of CBD stone = lap chole with intraoperative cholangiogram 7/25 Continue Zosyn Now patient has developed postoperative ileus with multiple episodes of nausea and vomiting Patient seen by GI   Dr Rhea Belton, no plans to perform ERCP Postop was complicated by development of ileus requiring placement of NG tube-output from NG tube 1600 cc KUB shows persistent mid to small bowel obstructive pattern Patient may need TPN, going on 6 days without return of bowel function Future GI follow up with Dr Mann/Hung    hypokalemia  Repleted  Sepsis-improving, likely secondary to #1 No growth so far on urine culture, blood culture Continue Zosyn    Hyponatremia   - Serum sodium 133 on admission in setting of dehydration ,    Improving  3. GERD  - Stable, managed with Protonix BID at home - Continue Protonix q12h, converted to IV while NPO    4. Hypothyroidism  - Appears to be stable  - Resume Synthroid once appropriate for PO intake    5. Arthritis with chronic pain  - Stable, managed with APAP and prn Norco at home  - Currently controlling abd pain with fentanyl injections    6. Hyperglycemia  Hemoglobin A1c 5.5 Accu-Chek stable, continue sliding scale insulin     DVT prophylaxsis Lovenox  Code Status:  Full code     Family Communication: Discussed in detail with the patient, all imaging results, lab results explained to the patient   Disposition Plan:  Continues to have ileus, not stable for discharge      Consultants:  GI  Gen. surgery    Procedures:  Laparoscopic cholecystectomy on 7/25  Antibiotics: Anti-infectives    Start     Dose/Rate Route Frequency Ordered Stop   10/16/15 0400  [MAR Hold]  piperacillin-tazobactam (ZOSYN) IVPB 3.375 g     (MAR Hold since 10/17/15 1010)  Comments:  Zosyn 3.375 g IV q8h for CrCl > 20 mL/min   3.375 g 12.5 mL/hr over 240 Minutes Intravenous Every 8 hours 10/16/15 0105         HPI/Subjective: NG tube is clamped, however patient states that her nausea has improved and she had a bowel movement  Objective: Vitals:   10/22/15 0510 10/22/15 1300 10/22/15 2005 10/23/15 0648  BP: (!) 177/70 (!) 148/71 (!) 157/70 (!) 153/76  Pulse: 84 76 71 71  Resp: 19  18 16  Temp: 98.1 F (36.7 C) 98.8 F (37.1 C) 98.6 F (37 C) 98.6 F (37 C)  TempSrc: Oral Oral Oral Oral  SpO2: 95% 93% 94% 95%  Weight:    114 kg (251 lb 6.4 oz)  Height:        Intake/Output Summary (Last 24 hours) at 10/23/15 0948 Last data filed at 10/23/15 7829  Gross per 24 hour  Intake             4290 ml  Output             3550 ml  Net              740 ml    Exam:  Examination:  General exam: Appears calm and comfortable  Respiratory system: Clear to  auscultation. Respiratory effort normal. Cardiovascular system: S1 & S2 heard, RRR. No JVD, murmurs, rubs, gallops or clicks. No pedal edema. Gastrointestinal system: Tender in the right upper quadrant. No organomegaly or masses felt. Normal bowel sounds heard. Central nervous system: Alert and oriented. No focal neurological deficits. Extremities: Symmetric 5 x 5 power. Skin: No rashes, lesions or ulcers Psychiatry: Judgement and insight appear normal. Mood & affect appropriate.     Data Reviewed: I have personally reviewed following labs and imaging studies  Micro Results Recent Results (from the past 240 hour(s))  Culture, blood (Routine x 2)     Status: None   Collection Time: 10/15/15  6:32 PM  Result Value Ref Range Status   Specimen Description RIGHT ANTECUBITAL  Final   Special Requests BOTTLES DRAWN AEROBIC AND ANAEROBIC 10CC  Final   Culture NO GROWTH 5 DAYS  Final   Report Status 10/20/2015 FINAL  Final  Culture, blood (Routine x 2)     Status: None   Collection Time: 10/15/15  7:03 PM  Result Value Ref Range Status   Specimen Description RIGHT ANTECUBITAL  Final   Special Requests BOTTLES DRAWN AEROBIC AND ANAEROBIC  Final   Culture NO GROWTH 5 DAYS  Final   Report Status 10/20/2015 FINAL  Final  Urine culture     Status: None   Collection Time: 10/15/15  9:49 PM  Result Value Ref Range Status   Specimen Description URINE, CATHETERIZED  Final   Special Requests NONE  Final   Culture NO GROWTH  Final   Report Status 10/17/2015 FINAL  Final  Surgical PCR screen     Status: None   Collection Time: 10/16/15  2:00 AM  Result Value Ref Range Status   MRSA, PCR NEGATIVE NEGATIVE Final   Staphylococcus aureus NEGATIVE NEGATIVE Final    Comment:        The Xpert SA Assay (FDA approved for NASAL specimens in patients over 12 years of age), is one component of a comprehensive surveillance program.  Test performance has been validated by [facility] for patients  greater than or equal to 109 year old. It is not intended to diagnose infection nor to guide or monitor treatment.     Radiology Reports Dg Chest 2 View  Result Date: 10/15/2015 CLINICAL DATA:  Fever, shortness of breath and chest pain for 1 day. EXAM: CHEST  2 VIEW COMPARISON:  CT chest and PA and lateral chest 01/30/2010. FINDINGS: The lungs are clear. Heart size is normal. No pneumothorax or pleural effusion. No focal bony abnormality. Surgical clips left upper quadrant of the abdomen are noted. IMPRESSION: No acute disease. Electronically Signed   By: Maisie Fus  Dalessio M.D.   On: 10/15/2015 19:09  Dg Cholangiogram Operative  Result Date: 10/17/2015 CLINICAL DATA:  Acute cholecystitis EXAM: INTRAOPERATIVE CHOLANGIOGRAM TECHNIQUE: Cholangiographic images from the C-arm fluoroscopic device were submitted for interpretation post-operatively. Please see the procedural report for the amount of contrast and the fluoroscopy time utilized. COMPARISON:  10/16/2015 FINDINGS: The biliary confluence, common hepatic duct, residual cystic duct, and common bile duct are all patent. Contrast drains into the duodenum. No filling defect or obstruction. IMPRESSION: Patent biliary system. Electronically Signed   By: Judie Petit.  Shick M.D.   On: 10/17/2015 11:37  Ct Abdomen Pelvis W Contrast  Result Date: 10/15/2015 CLINICAL DATA:  Generalized abdominal pain and nausea. EXAM: CT ABDOMEN AND PELVIS WITH CONTRAST TECHNIQUE: Multidetector CT imaging of the abdomen and pelvis was performed using the standard protocol following bolus administration of intravenous contrast. CONTRAST:  ISOVUE-300 IOPAMIDOL (ISOVUE-300) INJECTION 61% COMPARISON:  None. FINDINGS: Lower chest: Limited visualization of the lower thorax demonstrates minimal dependent subpleural ground-glass atelectasis, right greater than left. No discrete focal airspace opacities. No pleural effusion. Normal heart size.  No pericardial effusion. Hepatobiliary:  Normal hepatic contour. No discrete hepatic lesions. The gallbladder is distended with gallbladder wall thickening and minimal amount of associated pericholecystic fluid. These findings are associated with mild centralized intrahepatic biliary ductal dilatation and suspected periportal edema. No definite radiopaque gallstones. No ascites. Pancreas: Normal appearance of the pancreas. No pancreatic duct dilatation. No peripancreatic stranding. Spleen: Normal appearance of the spleen. No parent splenic stranding. Adrenals/Urinary Tract: There is symmetric enhancement and excretion of the bilateral kidneys. No definite renal stones this postcontrast examination. No discrete renal lesions. No urinary obstruction or perinephric stranding. Normal appearance of the bilateral adrenal glands. Normal appearance of the urinary bladder given degree distention. Stomach/Bowel: Sequela of prior gastric bypass surgery. No evidence of enteric obstruction Vascular/Lymphatic: Moderate amount of mixed calcified and noncalcified atherosclerotic plaque within a normal caliber abdominal aorta. The major branch vessels of the abdominal aorta appear patent on this non CTA examination. No bulky retroperitoneal, mesenteric, pelvic or inguinal lymphadenopathy on this noncontrast examination. Reproductive: Post hysterectomy. No discrete adnexal lesion. No free fluid in the pelvic cul-de-sac. Other: Regional soft tissues appear normal. Musculoskeletal: Post right total hip replacement. Note is made of an approximately 6.9 x 6.1 x 5.6 cm fluid collection about the anterior aspect of the right total hip prosthesis extending all the right iliacus musculature (representative axial image 86 and 72, series 21, coronal image 51, series 203). No discrete areas of osteolysis. IMPRESSION: 1. Findings worrisome for acute cholecystitis. Further evaluation nuclear medicine HIDA scan could be performed as clinically indicated. 2. Sequela of prior gastric  bypass surgery without evidence of enteric obstruction. 3. Post right total hip replacement with a indeterminate fluid collection about the anterior aspect of the right total hip replacement extending to involve the right iliacus musculature. Clinical correlation is advised. 4. Sequela of prior gastric bypass surgery without evidence of enteric obstruction. Electronically Signed   By: Simonne Come M.D.   On: 10/15/2015 22:53  Mr Abdomen Mrcp Wo Cm  Result Date: 10/16/2015 CLINICAL DATA:  Four-day history of worsening abdominal pain common nausea, vomiting and fever. Abnormal CT scan. EXAM: MRI ABDOMEN WITHOUT CONTRAST  (INCLUDING MRCP) TECHNIQUE: Multiplanar multisequence MR imaging of the abdomen was performed. Heavily T2-weighted images of the biliary and pancreatic ducts were obtained, and three-dimensional MRCP images were rendered by post processing. COMPARISON:  CT scan 10/15/2015 FINDINGS: Examination is quite limited due to respiratory  motion. In particular, the MRCP images are nondiagnostic. Lower chest: Bibasilar atelectasis. No pleural effusions. The heart is normal in size. No pericardial effusion. Hepatobiliary: Fatty infiltration of the liver is noted. No focal hepatic lesions. Moderate periportal edema is noted. No intrahepatic biliary dilatation. No common bile duct dilatation is identified. The gallbladder demonstrates a markedly thickened wall measuring up to 11 mm. There is layering debris dependently in the gallbladder which is probably a combination of sludge and tiny stones. Moderate pericholecystic inflammatory changes and fluid highly suggestive of acute cholecystitis. No obvious common bile duct stones. Pancreas: No mass, inflammation or ductal dilatation. Spleen: Normal size.  No focal lesions. Adrenals/Urinary Tract: The adrenal glands and kidneys are unremarkable. Stomach/Bowel: The stomach demonstrates surgical changes from bypass surgery. The visualized small bowel and colon are  grossly normal. Vascular/Lymphatic: No mesenteric or retroperitoneal mass or adenopathy. The aorta is normal in caliber. Moderate distal atherosclerotic calcifications. Other: Small volume of fluid around the liver and in the right pericolic gutter. Musculoskeletal: No significant bony findings. IMPRESSION: 1. Very limited MR examination due to patient motion. The MRCP images are quite limited. 2. MR findings consistent with acute cholecystitis. No obvious common bile duct dilatation or common bile duct stones. 3. Normal appearance of the pancreas in normal caliber pancreatic duct. 4. 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 Electronically Signed   By: Rudie Meyer M.D.   On: 10/16/2015 20:35  Mr 3d Recon At Scanner  Result Date: 10/16/2015 CLINICAL DATA:  Four-day history of worsening abdominal pain common nausea, vomiting and fever. Abnormal CT scan. EXAM: MRI ABDOMEN WITHOUT CONTRAST  (INCLUDING MRCP) TECHNIQUE: Multiplanar multisequence MR imaging of the abdomen was performed. Heavily T2-weighted images of the biliary and pancreatic ducts were obtained, and three-dimensional MRCP images were rendered by post processing. COMPARISON:  CT scan 10/15/2015 FINDINGS: Examination is quite limited due to respiratory motion. In particular, the MRCP images are nondiagnostic. Lower chest: Bibasilar atelectasis. No pleural effusions. The heart is normal in size. No pericardial effusion. Hepatobiliary: Fatty infiltration of the liver is noted. No focal hepatic lesions. Moderate periportal edema is noted. No intrahepatic biliary dilatation. No common bile duct dilatation is identified. The gallbladder demonstrates a markedly thickened wall measuring up to 11 mm. There is layering debris dependently in the gallbladder which is probably a combination of sludge and tiny stones. Moderate pericholecystic inflammatory changes and fluid highly suggestive of acute cholecystitis. No obvious common bile duct stones. Pancreas:  No mass, inflammation or ductal dilatation. Spleen: Normal size.  No focal lesions. Adrenals/Urinary Tract: The adrenal glands and kidneys are unremarkable. Stomach/Bowel: The stomach demonstrates surgical changes from bypass surgery. The visualized small bowel and colon are grossly normal. Vascular/Lymphatic: No mesenteric or retroperitoneal mass or adenopathy. The aorta is normal in caliber. Moderate distal atherosclerotic calcifications. Other: Small volume of fluid around the liver and in the right pericolic gutter. Musculoskeletal: No significant bony findings. IMPRESSION: 1. Very limited MR examination due to patient motion. The MRCP images are quite limited. 2. MR findings consistent with acute cholecystitis. No obvious common bile duct dilatation or common bile duct stones. 3. Normal appearance of the pancreas in normal caliber pancreatic duct. 4. 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 Electronically Signed   By: Rudie Meyer M.D.   On: 10/16/2015 20:35  Dg Abd 2 Views  Result Date: 10/23/2015 CLINICAL DATA:  Bowel obstruction, left-sided abdominal pain, nasogastric suction EXAM: ABDOMEN - 2 VIEW COMPARISON:  Abdominal series of October 21, 2015 FINDINGS: The  nasogastric tube is been withdrawn since it its tip lies in the distal third of the esophagus. There is distention of the stomach with fluid and gas. There are mildly distended small bowel loops in the left mid abdomen. The colon does not appear distended. Small amounts of stool and gas in the colon and rectum are observed. No free extraluminal gas collections are demonstrated. IMPRESSION: 1. Advancement of the nasogastric tube by approximately 20 cm is recommended to assure that the tip in proximal port lie within the stomach. The stomach is distended with gas and fluid. 2. Persistent mid to distal small bowel obstructive pattern. No evidence of perforation. Electronically Signed   By: David  [place] M.D.   On: 10/23/2015 07:54  Dg Abd 2  Views  Result Date: 10/21/2015 CLINICAL DATA:  Recent cholecystectomy. EXAM: ABDOMEN - 2 VIEW COMPARISON:  October 19, 2015 FINDINGS: Increasing gastric distention with an air-fluid level on the upright view. Multiple dilated loops of small bowel. The small bowel is dilated out of proportion to the colon. Minimal air in the rectum. No free air, portal venous gas, or pneumatosis. IMPRESSION: Increasing gastric distention with small bowel dilatation out of proportion to colonic caliber is most consistent with a small bowel obstruction. Electronically Signed   By: Gerome Sam III M.D   On: 10/21/2015 16:03  Dg Abd Portable 1v  Result Date: 10/19/2015 CLINICAL DATA:  Nausea and vomiting before and post cholecystectomy 10/17/2015. EXAM: PORTABLE ABDOMEN - 1 VIEW COMPARISON:  CT 10/15/2015 FINDINGS: Examination demonstrates air throughout the colon. There are several air-filled dilated small bowel loops measuring 4.1 cm in diameter over the left abdomen. No definite free peritoneal air. Multiple surgical clips over the left abdomen as well as right upper quadrant. Surgical suture line over the stomach. 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 IMPRESSION: Air-filled dilated small bowel loops in the left abdomen measuring 4.1 cm in diameter with air throughout the colon. Findings likely due to postoperative ileus, however small bowel obstructive process not excluded. Recommend serial follow-up abdominal films as clinically indicated. Electronically Signed   By: Elberta Fortis M.D.   On: 10/19/2015 10:18  US Abdomen Limited Ruq  Result Date: 10/15/2015 CLINICAL DATA:  65 year old female with generalized abdominal pain and fever. EXAM: US ABDOMEN LIMITED - RIGHT UPPER QUADRANT COMPARISON:  None. FINDINGS: Gallbladder: There is sludge within the gallbladder. No significant gallbladder wall thickening or pericholecystic fluid. There is slight indistinctness of the  gallbladder wall which may be technical. Mild edema is not excluded. Negative sonographic Murphy's sign. Common bile duct: Diameter: 6 mm Liver: There is mild diffuse increased hepatic echogenicity likely fatty infiltration. IMPRESSION: Gallbladder sludge. No definite sonographic evidence of acute cholecystitis. A hepatobiliary scintigraphy may provide better evaluation of the gallbladder if an acute cholecystitis is clinically suspected. Fatty liver. Electronically Signed   By: Elgie Collard M.D.   On: 10/15/2015 21:01    CBC  Recent Labs Lab 10/18/15 0534 10/20/15 0326 10/21/15 0445 10/22/15 0353 10/23/15 0436  WBC 9.6 13.8* 14.2* 12.0* 12.5*  HGB 11.5* 12.2 11.9* 11.5* 11.5*  HCT 37.0 37.0 38.1 36.2 36.6  PLT 200 302 370 377 376  MCV 92.5 89.8 92.3 90.7 91.0  MCH 28.8 29.6 28.8 28.8 28.6  MCHC 31.1 33.0 31.2 31.8 31.4  RDW 14.1 14.4 14.7 14.5 14.5    Chemistries   Recent Labs Lab 10/18/15 0534 10/19/15 0308 10/20/15 0326 10/21/15 0445 10/22/15 0353 10/23/15  0436  NA 136 136 138 134* 134* 134*  K 2.9* 3.4* 4.4 4.8 3.8 4.2  CL 104 103 105 100* 101 100*  CO2 25 26 25 26 26 23   GLUCOSE 131* 111* 115* 88 76 67  BUN <5* <5* <5* 7 6 7   CREATININE 0.83 0.74 0.75 0.78 0.81 0.80  CALCIUM 8.0* 8.4* 8.3* 8.5* 8.2* 8.0*  MG 1.7  --   --   --   --   --   AST 57* 43* 24 40 36 48*  ALT 48 42 29 33 30 39  ALKPHOS 197* 178* 153* 173* 168* 172*  BILITOT 1.1 0.9 0.8 1.0 0.8 1.0   ------------------------------------------------------------------------------------------------------------------ estimated creatinine clearance is 89.1 mL/min (by C-G formula based on SCr of 0.8 mg/dL). ------------------------------------------------------------------------------------------------------------------ No results for input(s): HGBA1C in the last 72 hours. ------------------------------------------------------------------------------------------------------------------ No results for  input(s): CHOL, HDL, LDLCALC, TRIG, CHOLHDL, LDLDIRECT in the last 72 hours. ------------------------------------------------------------------------------------------------------------------ No results for input(s): TSH, T4TOTAL, T3FREE, THYROIDAB in the last 72 hours.  Invalid input(s): FREET3 ------------------------------------------------------------------------------------------------------------------ No results for input(s): VITAMINB12, FOLATE, FERRITIN, TIBC, IRON, RETICCTPCT in the last 72 hours.  Coagulation profile No results for input(s): INR, PROTIME in the last 168 hours.  No results for input(s): DDIMER in the last 72 hours.  Cardiac Enzymes  Recent Labs Lab 10/19/15 1051 10/19/15 1421  TROPONINI <0.03 <0.03   ------------------------------------------------------------------------------------------------------------------ Invalid input(s): POCBNP   CBG:  Recent Labs Lab 10/22/15 1638 10/22/15 2003 10/23/15 0009 10/23/15 0423 10/23/15 0757  GLUCAP 70 89 71 77 73       Studies: Dg Abd 2 Views  Result Date: 10/23/2015 CLINICAL DATA:  Bowel obstruction, left-sided abdominal pain, nasogastric suction EXAM: ABDOMEN - 2 VIEW COMPARISON:  Abdominal series of October 21, 2015 FINDINGS: The nasogastric tube is been withdrawn since it its tip lies in the distal third of the esophagus. There is distention of the stomach with fluid and gas. There are mildly distended small bowel loops in the left mid abdomen. The colon does not appear distended. Small amounts of stool and gas in the colon and rectum are observed. No free extraluminal gas collections are demonstrated. IMPRESSION: 1. Advancement of the nasogastric tube by approximately 20 cm is recommended to assure that the tip in proximal port lie within the stomach. The stomach is distended with gas and fluid. 2. Persistent mid to distal small bowel obstructive pattern. No evidence of perforation. Electronically Signed    By: David  [place] M.D.   On: 10/23/2015 07:54  Dg Abd 2 Views  Result Date: 10/21/2015 CLINICAL DATA:  Recent cholecystectomy. EXAM: ABDOMEN - 2 VIEW COMPARISON:  October 19, 2015 FINDINGS: Increasing gastric distention with an air-fluid level on the upright view. Multiple dilated loops of small bowel. The small bowel is dilated out of proportion to the colon. Minimal air in the rectum. No free air, portal venous gas, or pneumatosis. IMPRESSION: Increasing gastric distention with small bowel dilatation out of proportion to colonic caliber is most consistent with a small bowel obstruction. Electronically Signed   By: Gerome Sam III M.D   On: 10/21/2015 16:03     Lab Results  Component Value Date   HGBA1C 5.5 10/16/2015   Lab Results  Component Value Date   CREATININE 0.80 10/23/2015       Scheduled Meds: . acetaminophen  1,000 mg Oral TID  . DULoxetine  60 mg Oral Daily  . estradiol  0.5 mg Oral Daily  . feeding supplement (ENSURE ENLIVE)  237 mL Oral BID BM  . heparin  5,000 Units Subcutaneous Q8H  . insulin aspart  0-9 Units Subcutaneous Q4H  . levothyroxine  88 mcg Oral QAC breakfast  . lip balm   Topical BID  . montelukast  10 mg Oral QHS  . pantoprazole  40 mg Oral BID  . piperacillin-tazobactam (ZOSYN)  IV  3.375 g Intravenous Q8H  . polyethylene glycol  17 g Oral BID  . rosuvastatin  10 mg Oral QPM  . sodium chloride flush  3 mL Intravenous Q12H   Continuous Infusions: . 0.9 % NaCl with KCl 40 mEq / L 100 mL/hr (10/22/15 2345)     LOS: 7 days    Time spent: >30 MINS    [facility]  [facility] Pager [phone]. If 7PM-7AM, please contact night-coverage at [email or web address], password [facility] 10/23/2015, 9:48 AM  LOS: 7 days

## 2015-10-23 NOTE — Progress Notes (Signed)
[facility] Progress Note  6 Days Post-Op  Subjective: Had to have NGT advanced 20 cm this AM due to poor positioning. Reports improved abdominal pain. Increased flatus overnight/this AM. Nausea improved. Ambulating with can. Urinating without hesitancy. Given enema this AM.  Objective: Vital signs in last 24 hours: Temp:  [98.6 F (37 C)-98.8 F (37.1 C)] 98.6 F (37 C) (07/31 0648) Pulse Rate:  [71-76] 71 (07/31 0648) Resp:  [16-18] 16 (07/31 0648) BP: (148-157)/(70-76) 153/76 (07/31 0648) SpO2:  [93 %-95 %] 95 % (07/31 0648) Weight:  [114 kg (251 lb 6.4 oz)] 114 kg (251 lb 6.4 oz) (07/31 0648) Last BM Date: 10/20/15  Intake/Output from previous day: 07/30 0701 - 07/31 0700 In: 4290 [I.V.:3990; IV Piggyback:300] Out: 3550 [Urine:1950; Emesis/NG output:1600] Intake/Output this shift: No intake/output data recorded.  PE: Gen:  Alert, NAD, pleasant Card:  RRR, no M/G/R  Pulm:  CTA, no W/R/R Abd: Soft, NT/ND, +BS, incisions C/D/I Ext:  No erythema, edema, or tenderness   Lab Results:   Recent Labs  10/22/15 0353 10/23/15 0436  WBC 12.0* 12.5*  HGB 11.5* 11.5*  HCT 36.2 36.6  PLT 377 376   BMET  Recent Labs  10/22/15 0353 10/23/15 0436  NA 134* 134*  K 3.8 4.2  CL 101 100*  CO2 26 23  GLUCOSE 76 67  BUN 6 7  CREATININE 0.81 0.80  CALCIUM 8.2* 8.0*   PT/INR No results for input(s): LABPROT, INR in the last 72 hours. CMP     Component Value Date/Time   NA 134 (L) 10/23/2015 0436   K 4.2 10/23/2015 0436   CL 100 (L) 10/23/2015 0436   CO2 23 10/23/2015 0436   GLUCOSE 67 10/23/2015 0436   BUN 7 10/23/2015 0436   CREATININE 0.80 10/23/2015 0436   CALCIUM 8.0 (L) 10/23/2015 0436   PROT 5.4 (L) 10/23/2015 0436   ALBUMIN 2.4 (L) 10/23/2015 0436   AST 48 (H) 10/23/2015 0436   ALT 39 10/23/2015 0436   ALKPHOS 172 (H) 10/23/2015 0436   BILITOT 1.0 10/23/2015 0436   GFRNONAA >60 10/23/2015 0436   GFRAA >60 10/23/2015 0436   Lipase      Component Value Date/Time   LIPASE 12 10/18/2015 0534       Studies/Results: Dg Abd 2 Views  Result Date: 10/23/2015 CLINICAL DATA:  Bowel obstruction, left-sided abdominal pain, nasogastric suction EXAM: ABDOMEN - 2 VIEW COMPARISON:  Abdominal series of October 21, 2015 FINDINGS: The nasogastric tube is been withdrawn since it its tip lies in the distal third of the esophagus. There is distention of the stomach with fluid and gas. There are mildly distended small bowel loops in the left mid abdomen. The colon does not appear distended. Small amounts of stool and gas in the colon and rectum are observed. No free extraluminal gas collections are demonstrated. IMPRESSION: 1. Advancement of the nasogastric tube by approximately 20 cm is recommended to assure that the tip in proximal port lie within the stomach. The stomach is distended with gas and fluid. 2. Persistent mid to distal small bowel obstructive pattern. No evidence of perforation. Electronically Signed   By: David  [place] M.D.   On: 10/23/2015 07:54  Dg Abd 2 Views  Result Date: 10/21/2015 CLINICAL DATA:  Recent cholecystectomy. EXAM: ABDOMEN - 2 VIEW COMPARISON:  October 19, 2015 FINDINGS: Increasing gastric distention with an air-fluid level on the upright view. Multiple dilated loops of small bowel. The small bowel is dilated out of  proportion to the colon. Minimal air in the rectum. No free air, portal venous gas, or pneumatosis. IMPRESSION: Increasing gastric distention with small bowel dilatation out of proportion to colonic caliber is most consistent with a small bowel obstruction. Electronically Signed   By: Gerome Sam III M.D   On: 10/21/2015 16:03   Anti-infectives: Anti-infectives    Start     Dose/Rate Route Frequency Ordered Stop   10/16/15 0400  piperacillin-tazobactam (ZOSYN) IVPB 3.375 g    Comments:  Zosyn 3.375 g IV q8h for CrCl > 20 mL/min   3.375 g 12.5 mL/hr over 240 Minutes Intravenous Every 8 hours 10/16/15  0105     10/15/15 2100  piperacillin-tazobactam (ZOSYN) IVPB 3.375 g     3.375 g 100 mL/hr over 30 Minutes Intravenous  Once 10/15/15 2056 10/15/15 2131     Assessment/Plan Acute cholecystitis  Laparoscopic cholecystectomy w/ IOC POD#5 Post-op ileus  - clinically improving, Abd film shows persistent ileus with some air fluid levels - NGT clamping trial today  Leukocytosis: 12.5 today, from 12.0 yesterday Hypothyroidism HLD  FEN: sips of clears from floor ID: Zosyn Day#8 DVT Proph: heparin 5,000 units, SCDs Plan: NGT clamping trial with sips of clears from floor.  If tolerates well we can d/c NGT later today and advance diet tomorrow.   LOS: 7 days    Adam Phenix , [facility] 10/23/2015, 10:07 AM Pager: [phone] Consults: [phone] Mon-Fri 7:00 am-4:30 pm Sat-Sun 7:00 am-11:30 am

## 2015-10-24 LAB — GLUCOSE, CAPILLARY
GLUCOSE-CAPILLARY: 117 mg/dL — AB (ref 65–99)
Glucose-Capillary: 93 mg/dL (ref 65–99)
Glucose-Capillary: 97 mg/dL (ref 65–99)

## 2015-10-24 NOTE — Discharge Summary (Signed)
Physician Discharge Summary  Haley Larson MRN: [id number] DOB/AGE: 1950/05/18 65 y.o.  PCP: WHITE, MARSHA L, NP   Admit date: 10/15/2015 Discharge date: 10/24/2015  Discharge Diagnoses:    Principal Problem:   Acute cholecystitis s/p lap chole 10/17/2015 Active Problems:   Hyperlipidemia   Status post THR (total hip replacement)   History of gastric bypass   Hypothyroidism   GERD (gastroesophageal reflux disease)   Hyponatremia   Hyperglycemia   Sepsis ([place])   Acute cholangitis   Elective surgical procedure   Elevated LFTs   Hypokalemia   Abdominal distension   Cholelithiasis with acute cholangitis   Bowel obstruction ([place])    Follow-up recommendations Follow-up with PCP in 3-5 days , including all  additional recommended appointments as below Follow-up CBC, CMP in 3-5 days Patient to follow-up with  general surgery in 1 week Patient to continue with soft diet Patient would also follow-up with gastroenterology, Dr. Benson Norway in the next 1-2 weeks    Current Discharge Medication List    CONTINUE these medications which have NOT CHANGED   Details  acetaminophen (TYLENOL) 500 MG tablet Take 500-1,000 mg by mouth every 6 (six) hours as needed for mild pain, fever or headache.    aspirin EC 81 MG tablet Take 81 mg by mouth daily.    estradiol (ESTRACE) 0.5 MG tablet Take 1 tablet by mouth daily.    HYDROcodone-acetaminophen (NORCO) 5-325 MG tablet Take 1-2 tablets by mouth every 4 (four) hours as needed for moderate pain. Qty: 60 tablet, Refills: 0    levothyroxine (SYNTHROID, LEVOTHROID) 88 MCG tablet Take 88 mcg by mouth daily before breakfast.     methocarbamol (ROBAXIN) 500 MG tablet Take 500 mg by mouth See admin instructions. Every 8-12 hours as needed for spasms    montelukast (SINGULAIR) 10 MG tablet Take 1 tablet by mouth at bedtime.    ondansetron (ZOFRAN-ODT) 8 MG disintegrating tablet Take 8 mg by mouth See admin instructions. Every 8-12 hours as needed  for nausea/vomiting    pantoprazole (PROTONIX) 40 MG tablet Take 40 mg by mouth 2 (two) times daily.    pregabalin (LYRICA) 150 MG capsule Take 150 mg by mouth See admin instructions. One to two times daily    rosuvastatin (CRESTOR) 10 MG tablet Take 10 mg by mouth every evening.    DULoxetine (CYMBALTA) 60 MG capsule Take 60 mg by mouth daily.    polyethylene glycol (MIRALAX / GLYCOLAX) packet Take 17 g by mouth 2 (two) times daily. Qty: 14 each, Refills: 0      STOP taking these medications     ferrous sulfate 325 (65 FE) MG tablet          Discharge Condition: Stable Discharge Instructions Get Medicines reviewed and adjusted: Please take all your medications with you for your next visit with your Primary MD  Please request your Primary MD to go over all hospital tests and procedure/radiological results at the follow up, please ask your Primary MD to get all Hospital records sent to his/her office.  If you experience worsening of your admission symptoms, develop shortness of breath, life threatening emergency, suicidal or homicidal thoughts you must seek medical attention immediately by calling 911 or calling your MD immediately if symptoms less severe.  You must read complete instructions/literature along with all the possible adverse reactions/side effects for all the Medicines you take and that have been prescribed to you. Take any new Medicines after you have completely understood and accpet all  the possible adverse reactions/side effects.   Do not drive when taking Pain medications.   Do not take more than prescribed Pain, Sleep and Anxiety Medications  Special Instructions: If you have smoked or chewed Tobacco in the last 2 yrs please stop smoking, stop any regular Alcohol and or any Recreational drug use.  Wear Seat belts while driving.  Please note  You were cared for by a hospitalist during your hospital stay. Once you are discharged, your primary care  physician will handle any further medical issues. Please note that NO REFILLS for any discharge medications will be authorized once you are discharged, as it is imperative that you return to your primary care physician (or establish a relationship with a primary care physician if you do not have one) for your aftercare needs so that they can reassess your need for medications and monitor your lab values.  Discharge Instructions    Diet - low sodium heart healthy    Complete by:  As directed   Increase activity slowly    Complete by:  As directed       No Known Allergies    Disposition: 01-Home or Self Care   Consults:  General surgery *    Significant Diagnostic Studies:  Dg Chest 2 View  Result Date: 10/15/2015 CLINICAL DATA:  Fever, shortness of breath and chest pain for 1 day. EXAM: CHEST  2 VIEW COMPARISON:  CT chest and PA and lateral chest 01/30/2010. FINDINGS: The lungs are clear. Heart size is normal. No pneumothorax or pleural effusion. No focal bony abnormality. Surgical clips left upper quadrant of the abdomen are noted. IMPRESSION: No acute disease. Electronically Signed   By: Inge Rise M.D.   On: 10/15/2015 19:09  Dg Cholangiogram Operative  Result Date: 10/17/2015 CLINICAL DATA:  Acute cholecystitis EXAM: INTRAOPERATIVE CHOLANGIOGRAM TECHNIQUE: Cholangiographic images from the C-arm fluoroscopic device were submitted for interpretation post-operatively. Please see the procedural report for the amount of contrast and the fluoroscopy time utilized. COMPARISON:  10/16/2015 FINDINGS: The biliary confluence, common hepatic duct, residual cystic duct, and common bile duct are all patent. Contrast drains into the duodenum. No filling defect or obstruction. IMPRESSION: Patent biliary system. Electronically Signed   By: Jerilynn Mages.  Shick M.D.   On: 10/17/2015 11:37  Ct Abdomen Pelvis W Contrast  Result Date: 10/15/2015 CLINICAL DATA:  Generalized abdominal pain and nausea. EXAM:  CT ABDOMEN AND PELVIS WITH CONTRAST TECHNIQUE: Multidetector CT imaging of the abdomen and pelvis was performed using the standard protocol following bolus administration of intravenous contrast. CONTRAST:  131m ISOVUE-300 IOPAMIDOL (ISOVUE-300) INJECTION 61% COMPARISON:  None. FINDINGS: Lower chest: Limited visualization of the lower thorax demonstrates minimal dependent subpleural ground-glass atelectasis, right greater than left. No discrete focal airspace opacities. No pleural effusion. Normal heart size.  No pericardial effusion. Hepatobiliary: Normal hepatic contour. No discrete hepatic lesions. The gallbladder is distended with gallbladder wall thickening and minimal amount of associated pericholecystic fluid. These findings are associated with mild centralized intrahepatic biliary ductal dilatation and suspected periportal edema. No definite radiopaque gallstones. No ascites. Pancreas: Normal appearance of the pancreas. No pancreatic duct dilatation. No peripancreatic stranding. Spleen: Normal appearance of the spleen. No parent splenic stranding. Adrenals/Urinary Tract: There is symmetric enhancement and excretion of the bilateral kidneys. No definite renal stones this postcontrast examination. No discrete renal lesions. No urinary obstruction or perinephric stranding. Normal appearance of the bilateral adrenal glands. Normal appearance of the urinary bladder given degree distention. Stomach/Bowel: Sequela of prior gastric bypass  surgery. No evidence of enteric obstruction Vascular/Lymphatic: Moderate amount of mixed calcified and noncalcified atherosclerotic plaque within a normal caliber abdominal aorta. The major branch vessels of the abdominal aorta appear patent on this non CTA examination. No bulky retroperitoneal, mesenteric, pelvic or inguinal lymphadenopathy on this noncontrast examination. Reproductive: Post hysterectomy. No discrete adnexal lesion. No free fluid in the pelvic cul-de-sac. Other:  Regional soft tissues appear normal. Musculoskeletal: Post right total hip replacement. Note is made of an approximately 6.9 x 6.1 x 5.6 cm fluid collection about the anterior aspect of the right total hip prosthesis extending all the right iliacus musculature (representative axial image 86 and 72, series 21, coronal image 51, series 203). No discrete areas of osteolysis. IMPRESSION: 1. Findings worrisome for acute cholecystitis. Further evaluation nuclear medicine HIDA scan could be performed as clinically indicated. 2. Sequela of prior gastric bypass surgery without evidence of enteric obstruction. 3. Post right total hip replacement with a indeterminate fluid collection about the anterior aspect of the right total hip replacement extending to involve the right iliacus musculature. Clinical correlation is advised. 4. Sequela of prior gastric bypass surgery without evidence of enteric obstruction. Electronically Signed   By: Sandi Mariscal M.D.   On: 10/15/2015 22:53  Mr Abdomen Mrcp Wo Cm  Result Date: 10/16/2015 CLINICAL DATA:  Four-day history of worsening abdominal pain common nausea, vomiting and fever. Abnormal CT scan. EXAM: MRI ABDOMEN WITHOUT CONTRAST  (INCLUDING MRCP) TECHNIQUE: Multiplanar multisequence MR imaging of the abdomen was performed. Heavily T2-weighted images of the biliary and pancreatic ducts were obtained, and three-dimensional MRCP images were rendered by post processing. COMPARISON:  CT scan 10/15/2015 FINDINGS: Examination is quite limited due to respiratory motion. In particular, the MRCP images are nondiagnostic. Lower chest: Bibasilar atelectasis. No pleural effusions. The heart is normal in size. No pericardial effusion. Hepatobiliary: Fatty infiltration of the liver is noted. No focal hepatic lesions. Moderate periportal edema is noted. No intrahepatic biliary dilatation. No common bile duct dilatation is identified. The gallbladder demonstrates a markedly thickened wall measuring  up to 11 mm. There is layering debris dependently in the gallbladder which is probably a combination of sludge and tiny stones. Moderate pericholecystic inflammatory changes and fluid highly suggestive of acute cholecystitis. No obvious common bile duct stones. Pancreas: No mass, inflammation or ductal dilatation. Spleen: Normal size.  No focal lesions. Adrenals/Urinary Tract: The adrenal glands and kidneys are unremarkable. Stomach/Bowel: The stomach demonstrates surgical changes from bypass surgery. The visualized small bowel and colon are grossly normal. Vascular/Lymphatic: No mesenteric or retroperitoneal mass or adenopathy. The aorta is normal in caliber. Moderate distal atherosclerotic calcifications. Other: Small volume of fluid around the liver and in the right pericolic gutter. Musculoskeletal: No significant bony findings. IMPRESSION: 1. Very limited MR examination due to patient motion. The MRCP images are quite limited. 2. MR findings consistent with acute cholecystitis. No obvious common bile duct dilatation or common bile duct stones. 3. Normal appearance of the pancreas in normal caliber pancreatic duct. 4. 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 Electronically Signed   By: Marijo Sanes M.D.   On: 10/16/2015 20:35  Mr 3d Recon At Scanner  Result Date: 10/16/2015 CLINICAL DATA:  Four-day history of worsening abdominal pain common nausea, vomiting and fever. Abnormal CT scan. EXAM: MRI ABDOMEN WITHOUT CONTRAST  (INCLUDING MRCP) TECHNIQUE: Multiplanar multisequence MR imaging of the abdomen was performed. Heavily T2-weighted images of the biliary and pancreatic ducts were obtained, and three-dimensional MRCP images were rendered by post processing.  COMPARISON:  CT scan 10/15/2015 FINDINGS: Examination is quite limited due to respiratory motion. In particular, the MRCP images are nondiagnostic. Lower chest: Bibasilar atelectasis. No pleural effusions. The heart is normal in size. No pericardial  effusion. Hepatobiliary: Fatty infiltration of the liver is noted. No focal hepatic lesions. Moderate periportal edema is noted. No intrahepatic biliary dilatation. No common bile duct dilatation is identified. The gallbladder demonstrates a markedly thickened wall measuring up to 11 mm. There is layering debris dependently in the gallbladder which is probably a combination of sludge and tiny stones. Moderate pericholecystic inflammatory changes and fluid highly suggestive of acute cholecystitis. No obvious common bile duct stones. Pancreas: No mass, inflammation or ductal dilatation. Spleen: Normal size.  No focal lesions. Adrenals/Urinary Tract: The adrenal glands and kidneys are unremarkable. Stomach/Bowel: The stomach demonstrates surgical changes from bypass surgery. The visualized small bowel and colon are grossly normal. Vascular/Lymphatic: No mesenteric or retroperitoneal mass or adenopathy. The aorta is normal in caliber. Moderate distal atherosclerotic calcifications. Other: Small volume of fluid around the liver and in the right pericolic gutter. Musculoskeletal: No significant bony findings. IMPRESSION: 1. Very limited MR examination due to patient motion. The MRCP images are quite limited. 2. MR findings consistent with acute cholecystitis. No obvious common bile duct dilatation or common bile duct stones. 3. Normal appearance of the pancreas in normal caliber pancreatic duct. 4. 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 Electronically Signed   By: Marijo Sanes M.D.   On: 10/16/2015 20:35  Dg Abd 2 Views  Result Date: 10/23/2015 CLINICAL DATA:  Bowel obstruction, left-sided abdominal pain, nasogastric suction EXAM: ABDOMEN - 2 VIEW COMPARISON:  Abdominal series of October 21, 2015 FINDINGS: The nasogastric tube is been withdrawn since it its tip lies in the distal third of the esophagus. There is distention of the stomach with fluid and gas. There are mildly distended small bowel loops in the left  mid abdomen. The colon does not appear distended. Small amounts of stool and gas in the colon and rectum are observed. No free extraluminal gas collections are demonstrated. IMPRESSION: 1. Advancement of the nasogastric tube by approximately 20 cm is recommended to assure that the tip in proximal port lie within the stomach. The stomach is distended with gas and fluid. 2. Persistent mid to distal small bowel obstructive pattern. No evidence of perforation. Electronically Signed   By: David  Martinique M.D.   On: 10/23/2015 07:54  Dg Abd 2 Views  Result Date: 10/21/2015 CLINICAL DATA:  Recent cholecystectomy. EXAM: ABDOMEN - 2 VIEW COMPARISON:  October 19, 2015 FINDINGS: Increasing gastric distention with an air-fluid level on the upright view. Multiple dilated loops of small bowel. The small bowel is dilated out of proportion to the colon. Minimal air in the rectum. No free air, portal venous gas, or pneumatosis. IMPRESSION: Increasing gastric distention with small bowel dilatation out of proportion to colonic caliber is most consistent with a small bowel obstruction. Electronically Signed   By: Dorise Bullion III M.D   On: 10/21/2015 16:03  Dg Abd Portable 1v  Result Date: 10/19/2015 CLINICAL DATA:  Nausea and vomiting before and post cholecystectomy 10/17/2015. EXAM: PORTABLE ABDOMEN - 1 VIEW COMPARISON:  CT 10/15/2015 FINDINGS: Examination demonstrates air throughout the colon. There are several air-filled dilated small bowel loops measuring 4.1 cm in diameter over the left abdomen. No definite free peritoneal air. Multiple surgical clips over the left abdomen as well as right upper quadrant. Surgical suture line over the stomach. There are  degenerative changes of the spine and left hip. Right hip arthroplasty is present. IMPRESSION: Air-filled dilated small bowel loops in the left abdomen measuring 4.1 cm in diameter with air throughout the colon. Findings likely due to postoperative ileus, however small bowel  obstructive process not excluded. Recommend serial follow-up abdominal films as clinically indicated. Electronically Signed   By: Marin Olp M.D.   On: 10/19/2015 10:18  US Abdomen Limited Ruq  Result Date: 10/15/2015 CLINICAL DATA:  65 year old female with generalized abdominal pain and fever. EXAM: US ABDOMEN LIMITED - RIGHT UPPER QUADRANT COMPARISON:  None. FINDINGS: Gallbladder: There is sludge within the gallbladder. No significant gallbladder wall thickening or pericholecystic fluid. There is slight indistinctness of the gallbladder wall which may be technical. Mild edema is not excluded. Negative sonographic Murphy's sign. Common bile duct: Diameter: 6 mm Liver: There is mild diffuse increased hepatic echogenicity likely fatty infiltration. IMPRESSION: Gallbladder sludge. No definite sonographic evidence of acute cholecystitis. A hepatobiliary scintigraphy may provide better evaluation of the gallbladder if an acute cholecystitis is clinically suspected. Fatty liver. Electronically Signed   By: Anner Crete M.D.   On: 10/15/2015 21:01       Filed Weights   10/21/15 0500 10/23/15 0648 10/24/15 0417  Weight: 118.5 kg (261 lb 3.2 oz) 114 kg (251 lb 6.4 oz) 110.3 kg (243 lb 1.6 oz)     Microbiology: Recent Results (from the past 240 hour(s))  Culture, blood (Routine x 2)     Status: None   Collection Time: 10/15/15  6:32 PM  Result Value Ref Range Status   Specimen Description RIGHT ANTECUBITAL  Final   Special Requests BOTTLES DRAWN AEROBIC AND ANAEROBIC 10CC  Final   Culture NO GROWTH 5 DAYS  Final   Report Status 10/20/2015 FINAL  Final  Culture, blood (Routine x 2)     Status: None   Collection Time: 10/15/15  7:03 PM  Result Value Ref Range Status   Specimen Description RIGHT ANTECUBITAL  Final   Special Requests BOTTLES DRAWN AEROBIC AND ANAEROBIC 5ML  Final   Culture NO GROWTH 5 DAYS  Final   Report Status 10/20/2015 FINAL  Final  Urine culture     Status: None    Collection Time: 10/15/15  9:49 PM  Result Value Ref Range Status   Specimen Description URINE, CATHETERIZED  Final   Special Requests NONE  Final   Culture NO GROWTH  Final   Report Status 10/17/2015 FINAL  Final  Surgical PCR screen     Status: None   Collection Time: 10/16/15  2:00 AM  Result Value Ref Range Status   MRSA, PCR NEGATIVE NEGATIVE Final   Staphylococcus aureus NEGATIVE NEGATIVE Final    Comment:        The Xpert SA Assay (FDA approved for NASAL specimens in patients over 51 years of age), is one component of a comprehensive surveillance program.  Test performance has been validated by [facility] for patients greater than or equal to 28 year old. It is not intended to diagnose infection nor to guide or monitor treatment.        Blood Culture    Component Value Date/Time   SDES URINE, CATHETERIZED 10/15/2015 2149   [place] NONE 10/15/2015 2149   CULT NO GROWTH 10/15/2015 2149   REPTSTATUS 10/17/2015 FINAL 10/15/2015 2149      Labs: Results for orders placed or performed during the hospital encounter of 10/15/15 (from the past 48 hour(s))  Glucose, capillary  Status: None   Collection Time: 10/22/15 11:36 AM  Result Value Ref Range   Glucose-Capillary 80 65 - 99 mg/dL  Glucose, capillary     Status: None   Collection Time: 10/22/15  4:38 PM  Result Value Ref Range   Glucose-Capillary 70 65 - 99 mg/dL  Glucose, capillary     Status: None   Collection Time: 10/22/15  8:03 PM  Result Value Ref Range   Glucose-Capillary 89 65 - 99 mg/dL  Glucose, capillary     Status: None   Collection Time: 10/23/15 12:09 AM  Result Value Ref Range   Glucose-Capillary 71 65 - 99 mg/dL  Glucose, capillary     Status: None   Collection Time: 10/23/15  4:23 AM  Result Value Ref Range   Glucose-Capillary 77 65 - 99 mg/dL  CBC     Status: Abnormal   Collection Time: 10/23/15  4:36 AM  Result Value Ref Range   WBC 12.5 (H) 4.0 - 10.5 K/uL   RBC 4.02 3.87 -  5.11 MIL/uL   Hemoglobin 11.5 (L) 12.0 - 15.0 g/dL   HCT 36.6 36.0 - 46.0 %   MCV 91.0 78.0 - 100.0 fL   MCH 28.6 26.0 - 34.0 pg   MCHC 31.4 30.0 - 36.0 g/dL   RDW 14.5 11.5 - 15.5 %   Platelets 376 150 - 400 K/uL  Comprehensive metabolic panel     Status: Abnormal   Collection Time: 10/23/15  4:36 AM  Result Value Ref Range   Sodium 134 (L) 135 - 145 mmol/L   Potassium 4.2 3.5 - 5.1 mmol/L   Chloride 100 (L) 101 - 111 mmol/L   CO2 23 22 - 32 mmol/L   Glucose, Bld 67 65 - 99 mg/dL   BUN 7 6 - 20 mg/dL   Creatinine, Ser 0.80 0.44 - 1.00 mg/dL   Calcium 8.0 (L) 8.9 - 10.3 mg/dL   Total Protein 5.4 (L) 6.5 - 8.1 g/dL   Albumin 2.4 (L) 3.5 - 5.0 g/dL   AST 48 (H) 15 - 41 U/L   ALT 39 14 - 54 U/L   Alkaline Phosphatase 172 (H) 38 - 126 U/L   Total Bilirubin 1.0 0.3 - 1.2 mg/dL   GFR calc non Af Amer >60 >60 mL/min   GFR calc Af Amer >60 >60 mL/min    Comment: (NOTE) The eGFR has been calculated using the CKD EPI equation. This calculation has not been validated in all clinical situations. eGFR's persistently <60 mL/min signify possible Chronic Kidney Disease.    Anion gap 11 5 - 15  Glucose, capillary     Status: None   Collection Time: 10/23/15  7:57 AM  Result Value Ref Range   Glucose-Capillary 73 65 - 99 mg/dL  Basic metabolic panel     Status: Abnormal   Collection Time: 10/23/15 11:03 AM  Result Value Ref Range   Sodium 134 (L) 135 - 145 mmol/L   Potassium 3.8 3.5 - 5.1 mmol/L   Chloride 97 (L) 101 - 111 mmol/L   CO2 22 22 - 32 mmol/L   Glucose, Bld 78 65 - 99 mg/dL   BUN 7 6 - 20 mg/dL   Creatinine, Ser 0.82 0.44 - 1.00 mg/dL   Calcium 8.2 (L) 8.9 - 10.3 mg/dL   GFR calc non Af Amer >60 >60 mL/min   GFR calc Af Amer >60 >60 mL/min    Comment: (NOTE) The eGFR has been calculated using the CKD EPI  equation. This calculation has not been validated in all clinical situations. eGFR's persistently <60 mL/min signify possible Chronic Kidney Disease.    Anion gap 15  5 - 15  Glucose, capillary     Status: None   Collection Time: 10/23/15 12:57 PM  Result Value Ref Range   Glucose-Capillary 75 65 - 99 mg/dL  Glucose, capillary     Status: Abnormal   Collection Time: 10/23/15  5:20 PM  Result Value Ref Range   Glucose-Capillary 102 (H) 65 - 99 mg/dL  Glucose, capillary     Status: Abnormal   Collection Time: 10/23/15  8:04 PM  Result Value Ref Range   Glucose-Capillary 110 (H) 65 - 99 mg/dL  Glucose, capillary     Status: None   Collection Time: 10/24/15 12:26 AM  Result Value Ref Range   Glucose-Capillary 93 65 - 99 mg/dL  Glucose, capillary     Status: None   Collection Time: 10/24/15  7:38 AM  Result Value Ref Range   Glucose-Capillary 97 65 - 99 mg/dL   Comment 1 Notify RN      Lipid Panel  No results found for: CHOL, TRIG, HDL, CHOLHDL, VLDL, LDLCALC, LDLDIRECT   Lab Results  Component Value Date   HGBA1C 5.5 10/16/2015     Lab Results  Component Value Date   CREATININE 0.82 10/23/2015     64 y.o.femalewith medical history significant for morbid obesity status post open gastric bypass in the 1970s, arthritis with chronic pain, hypothyroidism, and hyperlipidemia who presents the emergency department for evaluation of 4 days of worsening abdominal pain, nausea, and vomiting with development of fever. Now has Acute cholecystitis - possible common bile duct obstruction with increasing bilirubin. Patient status post laparoscopic cholecystectomy on 7/25 now with postoperative ileus  Hospital course Cholelithiasis, acute cholecystitis, Laparoscopic cholecystectomy w/ IOC POD#5  MRCP  without   strong evid of CBD stone = lap chole with intraoperative cholangiogram 7/25 Empirically treated with [facility] course complicated by development of postoperative ileus with multiple episodes of nausea and vomiting requiring NG tube placement Patient seen by GI   Dr Hilarie Fredrickson, no plans to perform ERCP KUB 2 days ago showed   persistent mid  to small bowel obstructive pattern, however patient clinically much better NGT d/ced yesterday evening. Multiple BM's yesterday, one this morning that was more formed. Does report one episode of vomiting this AM around 5:00AM. Denies any other nausea or subsequent vomiting. Pain improving Future GI follow up with Dr Mann/Hung is recommended by general surgery   continue soft diet, patient to discharge home today if she tolerates a soft diet  hypokalemia  Repleted  Sepsis-improving, likely secondary to #1 No growth so far on urine culture, blood culture      Hyponatremia  - Serum sodium 133 on admission in setting of dehydration ,  Resolved  3. GERD  - Stable, managed with Protonix BID at home    4. Hypothyroidism  - Appears to be stable , resume Synthroid    5. Arthritis with chronic pain  - Stable, managed with APAP and prn Norco at home  - Currently controlling abd pain with fentanyl injections   6. Hyperglycemia  Hemoglobin A1c 5.5  Accu-Chek stable       Discharge Exam:  Blood pressure (!) 157/78, pulse 80, temperature 98.3 F (36.8 C), temperature source Oral, resp. rate 16, height '5\' 4"'  (1.626 m), weight 110.3 kg (243 lb 1.6 oz), SpO2 95 %. General exam: Appears calm  and comfortable  Respiratory system: Clear to auscultation. Respiratory effort normal. Cardiovascular system: S1 & S2 heard, RRR. No JVD, murmurs, rubs, gallops or clicks. No pedal edema. Gastrointestinal system: Tender in the right upper quadrant. No organomegaly or masses felt. Normal bowel sounds heard. Central nervous system: Alert and oriented. No focal neurological deficits. Extremities: Symmetric 5 x 5 power. Skin: No rashes, lesions or ulcers Psychiatry: Judgement and insight appear normal. Mood & affect appropriate.       Follow-up [facility], [place]. Go on 11/01/2015.   Specialty:  General Surgery Why:  Your appointment for post-operative follow up  is at 12:15 PM. please arrive 30 minutes early to get checked in and fill out any necessary paperwork. Contact information: [place], Delmar Landau, NP. Schedule an appointment as soon as possible for a visit today.   Specialty:  Family Medicine Why:  Hospital follow-up, call as soon as possible to make this appointment Contact information: [place] [address] [place] [address] [phone]           Signed: Reyne Dumas 10/24/2015, 10:41 AM        Time spent >45 mins

## 2015-10-24 NOTE — Progress Notes (Signed)
AVS given to patient. IV removed. Per MD order, patient will leave after dinner to ensure no more nausea symptoms.  Understanding verbalized. Belongings packed. Transportation arranged by patient.

## 2015-10-24 NOTE — Progress Notes (Signed)
Patient vomited green bile looking emesis x1 approximately 50cc, Zofran IV given . Will monitor.

## 2015-10-24 NOTE — Discharge Instructions (Signed)
Your appointment is at 12:15 PM on 11/01/15, please arrive at least 30 min before your appointment to complete your check in paperwork.  If you are unable to arrive 30 min prior to your appointment time we may have to cancel or reschedule you.  LAPAROSCOPIC SURGERY: POST OP INSTRUCTIONS  1. DIET: Follow a light bland diet the first 24 hours after arrival home, such as soup, liquids, crackers, etc. Be sure to include lots of fluids daily. Avoid fast food or heavy meals as your are more likely to get nauseated. Eat a low fat the next few days after surgery.  2. Take your usually prescribed home medications unless otherwise directed. 3. PAIN CONTROL:  1. Pain is best controlled by a usual combination of three different methods TOGETHER:  1. Ice/Heat 2. Over the counter pain medication 3. Prescription pain medication 2. Most patients will experience some swelling and bruising around the incisions. Ice packs or heating pads (30-60 minutes up to 6 times a day) will help. Use ice for the first few days to help decrease swelling and bruising, then switch to heat to help relax tight/sore spots and speed recovery. Some people prefer to use ice alone, heat alone, alternating between ice & heat. Experiment to what works for you. Swelling and bruising can take several weeks to resolve.  3. It is helpful to take an over-the-counter pain medication regularly for the first few weeks. Choose one of the following that works best for you:  1. Naproxen (Aleve, etc) Two 220mg  tabs twice a day 2. Ibuprofen (Advil, etc) Three 200mg  tabs four times a day (every meal & bedtime) 3. Acetaminophen (Tylenol, etc) 500-650mg  four times a day (every meal & bedtime) 4. A prescription for pain medication (such as oxycodone, hydrocodone, etc) should be given to you upon discharge. Take your pain medication as prescribed.  1. If you are having problems/concerns with the prescription medicine (does not control pain, nausea, vomiting,  rash, itching, etc), please call us [phone] to see if we need to switch you to a different pain medicine that will work better for you and/or control your side effect better. 2. If you need a refill on your pain medication, please contact your pharmacy. They will contact our office to request authorization. Prescriptions will not be filled after 5 pm or on week-ends. 4. Avoid getting constipated. Between the surgery and the pain medications, it is common to experience some constipation. Increasing fluid intake and taking a fiber supplement (such as Metamucil, Citrucel, FiberCon, MiraLax, etc) 1-2 times a day regularly will usually help prevent this problem from occurring. A mild laxative (prune juice, Milk of Magnesia, MiraLax, etc) should be taken according to package directions if there are no bowel movements after 48 hours.  5. Watch out for diarrhea. If you have many loose bowel movements, simplify your diet to bland foods & liquids for a few days. Stop any stool softeners and decrease your fiber supplement. Switching to mild anti-diarrheal medications (Kayopectate, Pepto Bismol) can help. If this worsens or does not improve, please call us. 6. Wash / shower every day. You may shower over the dressings as they are waterproof. Continue to shower over incision(s) after the dressing is off. 7. Remove your waterproof bandages 5 days after surgery. You may leave the incision open to air. You may replace a dressing/Band-Aid to cover the incision for comfort if you wish.  8. ACTIVITIES as tolerated:  1. You may resume regular (light) daily activities beginning the  next day--such as daily self-care, walking, climbing stairs--gradually increasing activities as tolerated. If you can walk 30 minutes without difficulty, it is safe to try more intense activity such as jogging, treadmill, bicycling, low-impact aerobics, swimming, etc. 2. Save the most intensive and strenuous activity for last such as sit-ups,  heavy lifting, contact sports, etc Refrain from any heavy lifting or straining until you are off narcotics for pain control.  3. DO NOT PUSH THROUGH PAIN. Let pain be your guide: If it hurts to do something, don't do it. Pain is your body warning you to avoid that activity for another week until the pain goes down. 4. You may drive when you are no longer taking prescription pain medication, you can comfortably wear a seatbelt, and you can safely maneuver your car and apply brakes. 5. You may have sexual intercourse when it is comfortable.  9. FOLLOW UP in our office  1. Please call [facility] at [phone] to set up an appointment to see your surgeon in the office for a follow-up appointment approximately 2-3 weeks after your surgery. 2. Make sure that you call for this appointment the day you arrive home to insure a convenient appointment time.      10. IF YOU HAVE DISABILITY OR FAMILY LEAVE FORMS, BRING THEM TO THE               OFFICE FOR PROCESSING.   WHEN TO CALL us [phone]:  1. Poor pain control 2. Reactions / problems with new medications (rash/itching, nausea, etc)  3. Fever over 101.5 F (38.5 C) 4. Inability to urinate 5. Nausea and/or vomiting 6. Worsening swelling or bruising 7. Continued bleeding from incision. 8. Increased pain, redness, or drainage from the incision  The clinic staff is available to answer your questions during regular business hours (8:30am-5pm). Please dont hesitate to call and ask to speak to one of our nurses for clinical concerns.  If you have a medical emergency, go to the nearest emergency room or call 911.  A surgeon from [facility] is always on call at the [facility], [place], [place], [place], [place] [address] ?  MAIN: [phone] ? TOLL FREE: [phone] ?  FAX [phone]  [email or web address]

## 2015-10-24 NOTE — Progress Notes (Signed)
[facility] Progress Note  7 Days Post-Op  Subjective: Reports feeling much better. NGT d/ced yesterday evening. Multiple BM's yesterday, one this morning that was more formed. Does report one episode of vomiting this AM around 5:00AM. Denies any other nausea or subsequent vomiting. Pain improving. Some "gas pains" in LUQ. +flatus. Ambulating.   Objective: Vital signs in last 24 hours: Temp:  [98.3 F (36.8 C)-98.8 F (37.1 C)] 98.3 F (36.8 C) (08/01 0417) Pulse Rate:  [72-80] 80 (08/01 0417) Resp:  [16-18] 16 (08/01 0417) BP: (145-157)/(71-78) 157/78 (08/01 0417) SpO2:  [95 %-96 %] 95 % (08/01 0417) Weight:  [110.3 kg (243 lb 1.6 oz)] 110.3 kg (243 lb 1.6 oz) (08/01 0417) Last BM Date: 10/23/15  Intake/Output from previous day: 07/31 0701 - 08/01 0700 In: 1160 [I.V.:1160] Out: 1400 [Urine:1400] Intake/Output this shift: Total I/O In: 431.3 [I.V.:181.3; IV Piggyback:250] Out: -   PE: Gen:  Alert, NAD, pleasant Card:  RRR, no M/G/R heard Pulm:  CTA, no W/R/R Abd: Soft, appropriately tender, ND, +BS, no HSM, incisions C/D/I  Lab Results:   Recent Labs  10/22/15 0353 10/23/15 0436  WBC 12.0* 12.5*  HGB 11.5* 11.5*  HCT 36.2 36.6  PLT 377 376   BMET  Recent Labs  10/23/15 0436 10/23/15 1103  NA 134* 134*  K 4.2 3.8  CL 100* 97*  CO2 23 22  GLUCOSE 67 78  BUN 7 7  CREATININE 0.80 0.82  CALCIUM 8.0* 8.2*   Studies/Results: Dg Abd 2 Views  Result Date: 10/23/2015 CLINICAL DATA:  Bowel obstruction, left-sided abdominal pain, nasogastric suction EXAM: ABDOMEN - 2 VIEW COMPARISON:  Abdominal series of October 21, 2015 FINDINGS: The nasogastric tube is been withdrawn since it its tip lies in the distal third of the esophagus. There is distention of the stomach with fluid and gas. There are mildly distended small bowel loops in the left mid abdomen. The colon does not appear distended. Small amounts of stool and gas in the colon and rectum are observed.  No free extraluminal gas collections are demonstrated. IMPRESSION: 1. Advancement of the nasogastric tube by approximately 20 cm is recommended to assure that the tip in proximal port lie within the stomach. The stomach is distended with gas and fluid. 2. Persistent mid to distal small bowel obstructive pattern. No evidence of perforation. Electronically Signed   By: David  [place]az[place] M.D.   On: 10/23/2015 07:54  Anti-infectives: Anti-infectives    Start     Dose/Rate Route Frequency Ordered Stop   10/16/15 0400  piperacillin-tazobactam (ZOSYN) IVPB 3.375 g    Comments:  Zosyn 3.375 g IV q8h for CrCl > 20 mL/min   3.375 g 12.5 mL/hr over 240 Minutes Intravenous Every 8 hours 10/16/15 0105     10/15/15 2100  piperacillin-tazobactam (ZOSYN) IVPB 3.375 g     3.375 g 100 mL/hr over 30 Minutes Intravenous  Once 10/15/15 2056 10/15/15 2131       Assessment/Plan Acute cholecystitis  Laparoscopic cholecystectomy w/ IOC POD#5 Post-op ileus  - resolving - add Mylicon for gas relief.  Leukocytosis: 12.5 yesterday Hypothyroidism -home meds HLD -home meds HTN- PRN hydralazine  FEN: soft diet ID: Zosyn Day#9 DVT Proph: heparin 5,000 units, SCDs Plan: advance to soft diet. From a surgical standpoint, she can be discharged later this afternoon/evening pending she tolerates the diet without further N/V. Clinically she looks and feels improved. She will need to follow-up in our office in one week.    LOS: 8  days    Adam Phenix , [facility] 10/24/2015, 9:02 AM Pager: [phone] Consults: [phone] Mon-Fri 7:00 am-4:30 pm Sat-Sun 7:00 am-11:30 am

## 2016-01-03 ENCOUNTER — Other Ambulatory Visit: Payer: Self-pay | Admitting: Family Medicine

## 2016-01-03 DIAGNOSIS — Z1231 Encounter for screening mammogram for malignant neoplasm of breast: Secondary | ICD-10-CM

## 2016-01-22 ENCOUNTER — Ambulatory Visit
Admission: RE | Admit: 2016-01-22 | Discharge: 2016-01-22 | Disposition: A | Discharge status: Home / Self Care | Payer: Managed Care, Other (non HMO) | Source: Ambulatory Visit | Attending: Family Medicine | Admitting: Family Medicine

## 2016-01-22 DIAGNOSIS — Z1231 Encounter for screening mammogram for malignant neoplasm of breast: Secondary | ICD-10-CM

## 2017-01-29 ENCOUNTER — Other Ambulatory Visit: Payer: Self-pay | Admitting: Family Medicine

## 2017-01-29 DIAGNOSIS — Z1231 Encounter for screening mammogram for malignant neoplasm of breast: Secondary | ICD-10-CM

## 2017-02-07 ENCOUNTER — Ambulatory Visit: Payer: Managed Care, Other (non HMO)

## 2017-02-10 DIAGNOSIS — E785 Hyperlipidemia, unspecified: Secondary | ICD-10-CM | POA: Insufficient documentation

## 2017-02-10 DIAGNOSIS — M797 Fibromyalgia: Secondary | ICD-10-CM | POA: Insufficient documentation

## 2017-02-10 DIAGNOSIS — J302 Other seasonal allergic rhinitis: Secondary | ICD-10-CM | POA: Insufficient documentation

## 2017-02-10 DIAGNOSIS — R112 Nausea with vomiting, unspecified: Secondary | ICD-10-CM | POA: Insufficient documentation

## 2017-02-10 DIAGNOSIS — Z9889 Other specified postprocedural states: Secondary | ICD-10-CM

## 2017-02-10 DIAGNOSIS — Z90710 Acquired absence of both cervix and uterus: Secondary | ICD-10-CM | POA: Insufficient documentation

## 2017-02-10 DIAGNOSIS — Z8249 Family history of ischemic heart disease and other diseases of the circulatory system: Secondary | ICD-10-CM | POA: Insufficient documentation

## 2017-02-10 DIAGNOSIS — Z780 Asymptomatic menopausal state: Secondary | ICD-10-CM | POA: Insufficient documentation

## 2017-02-10 DIAGNOSIS — M1990 Unspecified osteoarthritis, unspecified site: Secondary | ICD-10-CM | POA: Insufficient documentation

## 2017-02-18 ENCOUNTER — Ambulatory Visit: Payer: Managed Care, Other (non HMO)

## 2017-02-18 NOTE — Progress Notes (Signed)
Haley Larson Date of Birth: 03/09/1951 Medical Record #[id number]  History of Present Illness: [name] is seen for  follow up. She has a history of morbid obesity and dsypnea. She has a strong family history of CAD. Stress Cardiolite studies in December 2009 and July 2014 were normal.  She also has a history of hyperlipidemia. She denies a history of diabetes or hypertension. She had remote gastric bypass surgery in the 1970s. She has not been evaluated for sleep apnea in the past. Echo in July 2014 was also normal. She was noted to have a murmur during hospitalization in 2017 but this wasn't felt to be pathologic and most likely related to anemia. She is s/p THR and cholecystectomy last year.   On follow up today she reports that she was without any of her medications for most of last year due to insurance. Started back on November 2. Had gained weight from 246 2 years ago to 262. Now losing weight. Feels well. Is determined to lose weight. States she needs right TKR once she has lost weight. No significant dyspnea or chest pain.  Current Outpatient Medications on File Prior to Visit  Medication Sig Dispense Refill  . acetaminophen (TYLENOL) 500 MG tablet Take 500-1,000 mg by mouth every 6 (six) hours as needed for mild pain, fever or headache.    Marland Kitchen aspirin EC 81 MG tablet Take 81 mg by mouth daily.    . Cholecalciferol (VITAMIN D-1000 MAX ST) 1000 units tablet Take 1,000 Units by mouth daily.     . DOCOSAHEXAENOIC ACID PO Take 1 g by mouth.    . DULoxetine (CYMBALTA) 60 MG capsule Take 60 mg by mouth daily.    Marland Kitchen estradiol (ESTRACE) 0.5 MG tablet Take 1 tablet by mouth daily.    Marland Kitchen levothyroxine (SYNTHROID, LEVOTHROID) 88 MCG tablet Take 88 mcg by mouth daily before breakfast.     . montelukast (SINGULAIR) 10 MG tablet Take 1 tablet by mouth at bedtime.    . Multiple Vitamin (MULTIVITAMIN) capsule Take 1 capsule by mouth daily.     Marland Kitchen omeprazole (PRILOSEC) 40 MG capsule TAKE ONE CAPSULE  (40 MG TOTAL) BY MOUTH DAILY.    . rosuvastatin (CRESTOR) 10 MG tablet Take 10 mg by mouth every evening.     No current facility-administered medications on file prior to visit.     No Known Allergies  Past Medical History:  Diagnosis Date  . Arthritis   . Chest pain   . Dyslipidemia   . Family history of early CAD    STRONG  . Fibromyalgia   . GERD (gastroesophageal reflux disease)   . H/O: hysterectomy   . Hypothyroidism   . Morbid obesity ([place])   . PONV (postoperative nausea and vomiting)   . Postmenopausal   . Seasonal allergies     Past Surgical History:  Procedure Laterality Date  . ABDOMINAL HYSTERECTOMY    . CHOLECYSTECTOMY N/A 10/17/2015   Procedure: LAPAROSCOPIC CHOLECYSTECTOMY WITH INTRAOPERATIVE CHOLANGIOGRAM;  Surgeon: Donnie Mesa, MD;  Location: [facility];  Service: General;  Laterality: N/A;  . COLONOSCOPY  06/2009   Dr Collene Mares at outpt endoscopy center for family hax colon cancer.  Normal study to terminal ileum.   Marland Kitchen GASTRIC BYPASS  1977  . ROTATOR CUFF REPAIR  04/2007  . ROTATOR CUFF REPAIR  08/2007  . TONSILLECTOMY    . TOTAL HIP ARTHROPLASTY Right 07/19/2015   Procedure: TOTAL HIP ARTHROPLASTY ANTERIOR APPROACH;  Surgeon: Ninetta Lights, MD;  Location: [facility];  Service: Orthopedics;  Laterality: Right;    Social History   Tobacco Use  Smoking Status Former Smoker  . Packs/day: 1.00  . Years: 30.00  . Pack years: 30.00  . Types: Cigarettes  . Last attempt to quit: 03/25/2006  . Years since quitting: 10.9  Smokeless Tobacco Never Used    Social History   Substance and Sexual Activity  Alcohol Use No    Family History  Problem Relation Age of Onset  . Other Father        infection   . Coronary artery disease Mother        with stents  . Atrial fibrillation Mother        with pacemaker  . Heart failure Mother   . Diabetes Mother   . Heart attack Brother   . Heart attack Brother 62       massive  MI  . Heart attack Brother 54       x3  .  Heart attack Brother 55       with stent  . Coronary artery disease Brother        with stent  . Alcohol abuse Brother   . Coronary artery disease Brother        with stents    Review of Systems: As noted in history of present illness..  All other systems were reviewed and are negative.  Physical Exam: Body mass index is 43.74 kg/m.  BP 118/76   Pulse 68   Ht '5\' 4"'  (1.626 m)   Wt 254 lb 12.8 oz (115.6 kg)   SpO2 94%   BMI 43.74 kg/m  GENERAL:  Well appearing, obese WF in NAD HEENT:  PERRL, EOMI, sclera are clear. Oropharynx is clear. NECK:  No jugular venous distention, carotid upstroke brisk and symmetric, no bruits, no thyromegaly or adenopathy LUNGS:  Clear to auscultation bilaterally CHEST:  Unremarkable HEART:  RRR,  PMI not displaced or sustained,S1 and S2 within normal limits, no S3, no S4: no clicks, no rubs, no murmurs ABD:  Soft, nontender. BS +, no masses or bruits. No hepatomegaly, no splenomegaly EXT:  2 + pulses throughout, no edema, no cyanosis no clubbing SKIN:  Warm and dry.  No rashes NEURO:  Alert and oriented x 3. Cranial nerves II through XII intact. PSYCH:  Cognitively intact   LABORATORY DATA: Lab Results  Component Value Date   WBC 12.5 (H) 10/23/2015   HGB 11.5 (L) 10/23/2015   HCT 36.6 10/23/2015   PLT 376 10/23/2015   GLUCOSE 78 10/23/2015   ALT 39 10/23/2015   AST 48 (H) 10/23/2015   NA 134 (L) 10/23/2015   K 3.8 10/23/2015   CL 97 (L) 10/23/2015   CREATININE 0.82 10/23/2015   BUN 7 10/23/2015   CO2 22 10/23/2015   TSH 0.284 (L) 07/21/2015   INR 1.30 10/16/2015   HGBA1C 5.5 10/16/2015   Labs dated 01/24/17: normal CBC. CMET normal except alk phos 138. Iron studies normal. TSH 4.81 with normal free T4.  Cholesterol 219, triglycerides 261, HDL 44. LDL 123.   ECG today demonstrates normal sinus rhythm. Rate 67. Normal. I have personally reviewed and interpreted this study.   Assessment / Plan: 1. Morbid obesity. Continue efforts  at weight loss. Follow up in one year.  2.Hyperlipidemia-now back on Crestor. Encouraged dietary modification.  4. GERD. On prilosec

## 2017-02-21 ENCOUNTER — Encounter: Payer: Self-pay | Admitting: Cardiology

## 2017-02-21 ENCOUNTER — Ambulatory Visit (INDEPENDENT_AMBULATORY_CARE_PROVIDER_SITE_OTHER): Payer: Medicare Other | Admitting: Cardiology

## 2017-02-21 VITALS — BP 118/76 | HR 68 | Ht 64.0 in | Wt 254.8 lb

## 2017-02-21 DIAGNOSIS — E782 Mixed hyperlipidemia: Secondary | ICD-10-CM

## 2017-02-21 NOTE — Patient Instructions (Signed)
Continue efforts at weight loss   Continue your medication  I will see you in one year

## 2018-09-24 ENCOUNTER — Telehealth: Payer: Self-pay | Admitting: Cardiology

## 2018-09-24 NOTE — Telephone Encounter (Signed)
° ° °  COVID-19 Pre-Screening Questions:   In the past 7 to 10 days have you had a cough,  shortness of breath, headache, congestion, fever (100 or greater) body aches, chills, sore throat, or sudden loss of taste or sense of smell?no  Have you been around anyone with known Covid 19. no  Have you been around anyone who is awaiting Covid 19 test results in the past 7 to 10 days? no  Have you been around anyone who has been exposed to Covid 19, or has mentioned symptoms of Covid 19 within the past 7 to 10 days? no  If you have any concerns/questions about symptoms patients report during screening (either on the phone or at [facility]). Contact the provider seeing the patient or DOD for further guidance.  If neither are available contact a member of the leadership team.   I called pt to confirm her appt on 09-28-18 with Dr Martinique.

## 2018-09-26 NOTE — Progress Notes (Signed)
[name] Date of Birth: 1950-08-23 Medical Record #[id number]  History of Present Illness: Mrs. Sample is seen for  follow up. She has a history of morbid obesity and dsypnea. She has a strong family history of CAD. Stress Cardiolite studies in December 2009 and July 2014 were normal.  She also has a history of hyperlipidemia. She denies a history of diabetes or hypertension. She had remote gastric bypass surgery in the 1970s. She has not been evaluated for sleep apnea in the past. Echo in July 2014 was also normal. She was noted to have a murmur during hospitalization in 2017 but this wasn't felt to be pathologic and most likely related to anemia. She is s/p THR and cholecystectomy in 2017.  On follow up today she reports that she is doing well.  Weight is up and down. Needs to exercise more. Denies chest pain or SOB. Notes a brother died recently at age 72 with cardiac arrest.   Current Outpatient Medications on File Prior to Visit  Medication Sig Dispense Refill  . aspirin EC 81 MG tablet Take 81 mg by mouth daily.    . Cholecalciferol (VITAMIN D-1000 MAX ST) 1000 units tablet Take 1,000 Units by mouth daily.     . DOCOSAHEXAENOIC ACID PO Take 1 g by mouth.    . DULoxetine (CYMBALTA) 60 MG capsule Take 60 mg by mouth daily.    Marland Kitchen estradiol (ESTRACE) 0.5 MG tablet Take 1 tablet by mouth daily.    Marland Kitchen levothyroxine (SYNTHROID, LEVOTHROID) 88 MCG tablet Take 88 mcg by mouth daily before breakfast.     . montelukast (SINGULAIR) 10 MG tablet Take 1 tablet by mouth at bedtime.    . Multiple Vitamin (MULTIVITAMIN) capsule Take 1 capsule by mouth daily.     Marland Kitchen omeprazole (PRILOSEC) 40 MG capsule TAKE ONE CAPSULE (40 MG TOTAL) BY MOUTH DAILY.    . rosuvastatin (CRESTOR) 10 MG tablet Take 10 mg by mouth every evening.     No current facility-administered medications on file prior to visit.     No Known Allergies  Past Medical History:  Diagnosis Date  . Arthritis   . Chest pain   .  Dyslipidemia   . Family history of early CAD    STRONG  . Fibromyalgia   . GERD (gastroesophageal reflux disease)   . H/O: hysterectomy   . Hypothyroidism   . Morbid obesity ([place])   . PONV (postoperative nausea and vomiting)   . Postmenopausal   . Seasonal allergies     Past Surgical History:  Procedure Laterality Date  . ABDOMINAL HYSTERECTOMY    . CHOLECYSTECTOMY N/A 10/17/2015   Procedure: LAPAROSCOPIC CHOLECYSTECTOMY WITH INTRAOPERATIVE CHOLANGIOGRAM;  Surgeon: Donnie Mesa, MD;  Location: [facility];  Service: General;  Laterality: N/A;  . COLONOSCOPY  06/2009   Dr Collene Mares at outpt endoscopy center for family hax colon cancer.  Normal study to terminal ileum.   Marland Kitchen GASTRIC BYPASS  1977  . ROTATOR CUFF REPAIR  04/2007  . ROTATOR CUFF REPAIR  08/2007  . TONSILLECTOMY    . TOTAL HIP ARTHROPLASTY Right 07/19/2015   Procedure: TOTAL HIP ARTHROPLASTY ANTERIOR APPROACH;  Surgeon: Ninetta Lights, MD;  Location: [facility];  Service: Orthopedics;  Laterality: Right;    Social History   Tobacco Use  Smoking Status Former Smoker  . Packs/day: 1.00  . Years: 30.00  . Pack years: 30.00  . Types: Cigarettes  . Quit date: 03/25/2006  . Years since quitting: 45.5  Smokeless Tobacco Never Used    Social History   Substance and Sexual Activity  Alcohol Use No    Family History  Problem Relation Age of Onset  . Other Father        infection   . Coronary artery disease Mother        with stents  . Atrial fibrillation Mother        with pacemaker  . Heart failure Mother   . Diabetes Mother   . Heart attack Brother   . Heart attack Brother 47       massive  MI  . Heart attack Brother 54       x3  . Heart attack Brother 55       with stent  . Coronary artery disease Brother        with stent  . Alcohol abuse Brother   . Coronary artery disease Brother        with stents    Review of Systems: As noted in history of present illness..  All other systems were reviewed and are  negative.  Physical Exam: Body mass index is 45.1 kg/m.  BP (!) 142/81   Pulse 70   Ht '5\' 3"'  (1.6 m)   Wt 254 lb 9.6 oz (115.5 kg)   BMI 45.10 kg/m  GENERAL:  Well appearing, obese WF in NAD HEENT:  PERRL, EOMI, sclera are clear. Oropharynx is clear. NECK:  No jugular venous distention, carotid upstroke brisk and symmetric, no bruits, no thyromegaly or adenopathy LUNGS:  Clear to auscultation bilaterally CHEST:  Unremarkable HEART:  RRR,  PMI not displaced or sustained,S1 and S2 within normal limits, no S3, no S4: no clicks, no rubs, no murmurs ABD:  Soft, nontender. BS +, no masses or bruits. No hepatomegaly, no splenomegaly EXT:  2 + pulses throughout, no edema, no cyanosis no clubbing SKIN:  Warm and dry.  No rashes NEURO:  Alert and oriented x 3. Cranial nerves II through XII intact. PSYCH:  Cognitively intact   LABORATORY DATA: Lab Results  Component Value Date   WBC 12.5 (H) 10/23/2015   HGB 11.5 (L) 10/23/2015   HCT 36.6 10/23/2015   PLT 376 10/23/2015   GLUCOSE 78 10/23/2015   ALT 39 10/23/2015   AST 48 (H) 10/23/2015   NA 134 (L) 10/23/2015   K 3.8 10/23/2015   CL 97 (L) 10/23/2015   CREATININE 0.82 10/23/2015   BUN 7 10/23/2015   CO2 22 10/23/2015   TSH 0.284 (L) 07/21/2015   INR 1.30 10/16/2015   HGBA1C 5.5 10/16/2015   Labs dated 01/24/17: normal CBC. CMET normal except alk phos 138. Iron studies normal. TSH 4.81 with normal free T4.  Cholesterol 219, triglycerides 261, HDL 44. LDL 123.   ECG today demonstrates normal sinus rhythm. Rate 70. Normal. I have personally reviewed and interpreted this study.   Assessment / Plan: 1. Morbid obesity. Continue efforts at weight loss. Needs to increase activity.Follow up in one year.  2. Hyperlipidemia-now back on Crestor. LDL decreased from 123 to 90. continue  3. GERD.

## 2018-09-28 ENCOUNTER — Encounter: Payer: Self-pay | Admitting: Cardiology

## 2018-09-28 ENCOUNTER — Ambulatory Visit (INDEPENDENT_AMBULATORY_CARE_PROVIDER_SITE_OTHER): Payer: Medicare Other | Admitting: Cardiology

## 2018-09-28 ENCOUNTER — Other Ambulatory Visit: Payer: Self-pay

## 2018-09-28 VITALS — BP 142/81 | HR 70 | Ht 63.0 in | Wt 254.6 lb

## 2018-09-28 DIAGNOSIS — E782 Mixed hyperlipidemia: Secondary | ICD-10-CM

## 2019-09-30 NOTE — Progress Notes (Signed)
Haley Larson Date of Birth: [birth year] Medical Record #[id number]  History of Present Illness: Haley Larson is seen for  follow up. She has a history of morbid obesity and dsypnea. She has a strong family history of CAD. Stress Cardiolite studies in December 2009 and July 2014 were normal.  She also has a history of hyperlipidemia. She denies a history of diabetes or hypertension. She had remote gastric bypass surgery in the 1970s. She has not been evaluated for sleep apnea in the past. Echo in July 2014 was also normal. She was noted to have a murmur during hospitalization in 2017 but this wasn't felt to be pathologic and most likely related to anemia. She is s/p THR and cholecystectomy in 2017.  On follow up today she reports that she is doing well. She is retired now. She is very sedentary. Weight is unchanged.  She denies any chest pain or SOB. No edema or palpitations. Multiple family members have heart disease but also had diabetes and were smokers.    Current Outpatient Medications on File Prior to Visit  Medication Sig Dispense Refill  . albuterol (VENTOLIN HFA) 108 (90 Base) MCG/ACT inhaler Inhale into the lungs.    Marland Kitchen aspirin (ASPIRIN 81) 81 MG chewable tablet Chew 81 mg by mouth daily. 1 DAILy    . buPROPion (WELLBUTRIN SR) 150 MG 12 hr tablet Take by mouth.    . Cholecalciferol (VITAMIN D-1000 MAX ST) 1000 units tablet Take 1,000 Units by mouth daily.     . DOCOSAHEXAENOIC ACID PO Take 1 g by mouth.    . estradiol (ESTRACE) 0.5 MG tablet Take 1 tablet by mouth daily.    . fluticasone (FLONASE) 50 MCG/ACT nasal spray SPRAY 1 SPRAY INTO EACH NOSTRIL EVERY DAY    . levocetirizine (XYZAL) 5 MG tablet Take 1 tablet by mouth every evening.    Marland Kitchen levothyroxine (SYNTHROID) 75 MCG tablet Take 1 tablet by mouth daily.    . montelukast (SINGULAIR) 10 MG tablet Take 1 tablet by mouth at bedtime.    . Multiple Vitamin (MULTIVITAMIN) capsule Take 1 capsule by mouth daily.     . Omega-3 Fatty  Acids (FISH OIL) 1000 MG CAPS Take 1 capsule by mouth daily.    Marland Kitchen omeprazole (PRILOSEC) 40 MG capsule TAKE ONE CAPSULE (40 MG TOTAL) BY MOUTH DAILY.    . rosuvastatin (CRESTOR) 10 MG tablet Take 10 mg by mouth every evening.     No current facility-administered medications on file prior to visit.    No Known Allergies  Past Medical History:  Diagnosis Date  . Arthritis   . Chest pain   . Dyslipidemia   . Family history of early CAD    STRONG  . Fibromyalgia   . GERD (gastroesophageal reflux disease)   . H/O: hysterectomy   . Hypothyroidism   . Morbid obesity ([place])   . PONV (postoperative nausea and vomiting)   . Postmenopausal   . Seasonal allergies     Past Surgical History:  Procedure Laterality Date  . ABDOMINAL HYSTERECTOMY    . CHOLECYSTECTOMY N/A 10/17/2015   Procedure: LAPAROSCOPIC CHOLECYSTECTOMY WITH INTRAOPERATIVE CHOLANGIOGRAM;  Surgeon: Donnie Mesa, MD;  Location: [place];  Service: General;  Laterality: N/A;  . COLONOSCOPY  06/2009   Dr Collene Mares at outpt endoscopy center for family hax colon cancer.  Normal study to terminal ileum.   Marland Kitchen GASTRIC BYPASS  1977  . ROTATOR CUFF REPAIR  04/2007  . ROTATOR CUFF REPAIR  08/2007  .  TONSILLECTOMY    . TOTAL HIP ARTHROPLASTY Right 07/19/2015   Procedure: TOTAL HIP ARTHROPLASTY ANTERIOR APPROACH;  Surgeon: Ninetta Lights, MD;  Location: [place];  Service: Orthopedics;  Laterality: Right;    Social History   Tobacco Use  Smoking Status Former Smoker  . Packs/day: 1.00  . Years: 30.00  . Pack years: 30.00  . Types: Cigarettes  . Quit date: 03/25/2006  . Years since quitting: 13.5  Smokeless Tobacco Never Used    Social History   Substance and Sexual Activity  Alcohol Use No    Family History  Problem Relation Age of Onset  . Other Father        infection   . Coronary artery disease Mother        with stents  . Atrial fibrillation Mother        with pacemaker  . Heart failure Mother   . Diabetes Mother   .  Heart attack Brother   . Heart attack Brother 41       massive  MI  . Heart attack Brother 54       x3  . Heart attack Brother 22       with stent  . Coronary artery disease Brother        with stent  . Alcohol abuse Brother   . Coronary artery disease Brother        with stents    Review of Systems: As noted in history of present illness..  All other systems were reviewed and are negative.  Physical Exam: Body mass index is 45.17 kg/m.  BP 132/70   Pulse 70   Temp (!) 97 F (36.1 C)   Ht '5\' 3"'  (1.6 m)   Wt 255 lb (115.7 kg)   SpO2 97%   BMI 45.17 kg/m  GENERAL:  Well appearing, obese WF in NAD HEENT:  PERRL, EOMI, sclera are clear. Oropharynx is clear. NECK:  No jugular venous distention, carotid upstroke brisk and symmetric, no bruits, no thyromegaly or adenopathy LUNGS:  Clear to auscultation bilaterally CHEST:  Unremarkable HEART:  RRR,  PMI not displaced or sustained,S1 and S2 within normal limits, no S3, no S4: no clicks, no rubs, no murmurs ABD:  Soft, nontender. BS +, no masses or bruits. No hepatomegaly, no splenomegaly EXT:  2 + pulses throughout, no edema, no cyanosis no clubbing SKIN:  Warm and dry.  No rashes NEURO:  Alert and oriented x 3. Cranial nerves II through XII intact. PSYCH:  Cognitively intact   LABORATORY DATA: Lab Results  Component Value Date   WBC 12.5 (H) 10/23/2015   HGB 11.5 (L) 10/23/2015   HCT 36.6 10/23/2015   PLT 376 10/23/2015   GLUCOSE 78 10/23/2015   ALT 39 10/23/2015   AST 48 (H) 10/23/2015   NA 134 (L) 10/23/2015   K 3.8 10/23/2015   CL 97 (L) 10/23/2015   CREATININE 0.82 10/23/2015   BUN 7 10/23/2015   CO2 22 10/23/2015   TSH 0.284 (L) 07/21/2015   INR 1.30 10/16/2015   HGBA1C 5.5 10/16/2015   Labs dated 01/24/17: normal CBC. CMET normal except alk phos 138. Iron studies normal. TSH 4.81 with normal free T4.  Cholesterol 219, triglycerides 261, HDL 44. LDL 123.  Dated 07/16/19: cholesterol 192, triglycerides 206,  HDL 44, LDL 112. TSH 5.69. Free T4 normal. CMET and CBC normal.   ECG today demonstrates normal sinus rhythm. Rate 67. Incomplete RBBB. I have personally reviewed and interpreted  this study.   Assessment / Plan: 1. Morbid obesity. Needs to intensify efforts at weight loss and increased aerobic activity. Needs to increase activity.Follow up in one year.  2. Hyperlipidemia-now back on Crestor. LDL decreased from 123 to 112. Triglycerides down from 261 to 206.

## 2019-10-04 ENCOUNTER — Encounter: Payer: Self-pay | Admitting: Cardiology

## 2019-10-04 ENCOUNTER — Ambulatory Visit (INDEPENDENT_AMBULATORY_CARE_PROVIDER_SITE_OTHER): Payer: Medicare Other | Admitting: Cardiology

## 2019-10-04 ENCOUNTER — Other Ambulatory Visit: Payer: Self-pay

## 2019-10-04 VITALS — BP 132/70 | HR 70 | Temp 97.0°F | Ht 63.0 in | Wt 255.0 lb

## 2019-10-04 DIAGNOSIS — E782 Mixed hyperlipidemia: Secondary | ICD-10-CM

## 2019-10-04 NOTE — Patient Instructions (Signed)
Stop taking ASA 

## 2020-11-18 NOTE — Progress Notes (Signed)
Haley Larson Date of Birth: [birth year] Medical Record #[id number]  History of Present Illness: Haley Larson is seen for  follow up. She has a history of morbid obesity and dsypnea. She has a strong family history of CAD. Stress Cardiolite studies in December 2009 and July 2014 were normal.  She also has a history of hyperlipidemia. She denies a history of diabetes or hypertension. She had remote gastric bypass surgery in the 1970s. She has not been evaluated for sleep apnea in the past. Echo in July 2014 was also normal. She was noted to have a murmur during hospitalization in 2017 but this wasn't felt to be pathologic and most likely related to anemia. She is s/p THR and cholecystectomy in 2017.  On follow up today she reports that she is doing well. She is retired now. She is very sedentary. Weight is down 8 lbs from last year. She denies any chest pain or SOB. No edema or palpitations. Multiple family members have heart disease but also had diabetes and were smokers. She states when she had her lab work in May she wasn't taking her medication (Crestor).   Current Outpatient Medications on File Prior to Visit  Medication Sig Dispense Refill   albuterol (VENTOLIN HFA) 108 (90 Base) MCG/ACT inhaler Inhale into the lungs.     buPROPion (WELLBUTRIN SR) 150 MG 12 hr tablet Take by mouth.     Cholecalciferol 25 MCG (1000 UT) tablet Take 1,000 Units by mouth daily.      DOCOSAHEXAENOIC ACID PO Take 1 g by mouth.     fluticasone (FLONASE) 50 MCG/ACT nasal spray SPRAY 1 SPRAY INTO EACH NOSTRIL EVERY DAY     levocetirizine (XYZAL) 5 MG tablet Take 1 tablet by mouth every evening.     levothyroxine (SYNTHROID) 75 MCG tablet Take 1 tablet by mouth daily.     montelukast (SINGULAIR) 10 MG tablet Take 1 tablet by mouth at bedtime.     Multiple Vitamin (MULTIVITAMIN) capsule Take 1 capsule by mouth daily.      Omega-3 Fatty Acids (FISH OIL) 1000 MG CAPS Take 1 capsule by mouth daily.     omeprazole  (PRILOSEC) 40 MG capsule TAKE ONE CAPSULE (40 MG TOTAL) BY MOUTH DAILY.     No current facility-administered medications on file prior to visit.    No Known Allergies  Past Medical History:  Diagnosis Date   Arthritis    Chest pain    Dyslipidemia    Family history of early CAD    STRONG   Fibromyalgia    GERD (gastroesophageal reflux disease)    H/O: hysterectomy    Hypothyroidism    Morbid obesity ([facility])    PONV (postoperative nausea and vomiting)    Postmenopausal    Seasonal allergies     Past Surgical History:  Procedure Laterality Date   ABDOMINAL HYSTERECTOMY     CHOLECYSTECTOMY N/A 10/17/2015   Procedure: LAPAROSCOPIC CHOLECYSTECTOMY WITH INTRAOPERATIVE CHOLANGIOGRAM;  Surgeon: Donnie Mesa, MD;  Location: [place];  Service: General;  Laterality: N/A;   COLONOSCOPY  06/2009   Dr Collene Mares at outpt endoscopy center for family hax colon cancer.  Normal study to terminal ileum.    GASTRIC BYPASS  1977   ROTATOR CUFF REPAIR  04/2007   ROTATOR CUFF REPAIR  08/2007   TONSILLECTOMY     TOTAL HIP ARTHROPLASTY Right 07/19/2015   Procedure: TOTAL HIP ARTHROPLASTY ANTERIOR APPROACH;  Surgeon: Ninetta Lights, MD;  Location: [place];  Service: Orthopedics;  Laterality: Right;    Social History   Tobacco Use  Smoking Status Former   Packs/day: 1.00   Years: 30.00   Pack years: 30.00   Types: Cigarettes   Quit date: 03/25/2006   Years since quitting: 14.6  Smokeless Tobacco Never    Social History   Substance and Sexual Activity  Alcohol Use No    Family History  Problem Relation Age of Onset   Other Father        infection    Coronary artery disease Mother        with stents   Atrial fibrillation Mother        with pacemaker   Heart failure Mother    Diabetes Mother    Heart attack Brother    Heart attack Brother 6       massive  MI   Heart attack Brother 25       x3   Heart attack Brother 61       with stent   Coronary artery disease Brother        with  stent   Alcohol abuse Brother    Coronary artery disease Brother        with stents    Review of Systems: As noted in history of present illness..  All other systems were reviewed and are negative.  Physical Exam: Body mass index is 45.32 kg/m.  BP 116/80   Resp 20   Ht '5\' 2"'  (1.575 m)   Wt 247 lb 12.8 oz (112.4 kg)   BMI 45.32 kg/m  GENERAL:  Well appearing, obese WF in NAD HEENT:  PERRL, EOMI, sclera are clear. Oropharynx is clear. NECK:  No jugular venous distention, carotid upstroke brisk and symmetric, no bruits, no thyromegaly or adenopathy LUNGS:  Clear to auscultation bilaterally CHEST:  Unremarkable HEART:  RRR,  PMI not displaced or sustained,S1 and S2 within normal limits, no S3, no S4: no clicks, no rubs, no murmurs ABD:  Soft, nontender. BS +, no masses or bruits. No hepatomegaly, no splenomegaly EXT:  2 + pulses throughout, no edema, no cyanosis no clubbing SKIN:  Warm and dry.  No rashes NEURO:  Alert and oriented x 3. Cranial nerves II through XII intact. PSYCH:  Cognitively intact   LABORATORY DATA: Lab Results  Component Value Date   WBC 12.5 (H) 10/23/2015   HGB 11.5 (L) 10/23/2015   HCT 36.6 10/23/2015   PLT 376 10/23/2015   GLUCOSE 78 10/23/2015   ALT 39 10/23/2015   AST 48 (H) 10/23/2015   NA 134 (L) 10/23/2015   K 3.8 10/23/2015   CL 97 (L) 10/23/2015   CREATININE 0.82 10/23/2015   BUN 7 10/23/2015   CO2 22 10/23/2015   TSH 0.284 (L) 07/21/2015   INR 1.30 10/16/2015   HGBA1C 5.5 10/16/2015   Labs dated 01/24/17: normal CBC. CMET normal except alk phos 138. Iron studies normal. TSH 4.81 with normal free T4.  Cholesterol 219, triglycerides 261, HDL 44. LDL 123.  Dated 07/16/19: cholesterol 192, triglycerides 206, HDL 44, LDL 112. TSH 5.69. Free T4 normal. CMET and CBC normal. Dated 08/16/20: cholesterol 233, triglycerides 240, HDL 45, LDL 144. CBC, CMET and TFTs normal.    ECG today demonstrates normal sinus rhythm. Rate 67. Mild voltage  criteria for LVH. I have personally reviewed and interpreted this study.   Assessment / Plan: 1. Morbid obesity. Needs to intensify efforts at weight loss and increased aerobic activity.   2. Hyperlipidemia-now  back on Crestor. Based on prior history I would recommend increasing Crestor to 20 mg daily. Ideally goal LDL < 70 since she does have aortic plaque and strong family history of CAD.   3. Aortic atherosclerosis. Noted on CT 2017.

## 2020-11-21 ENCOUNTER — Encounter: Payer: Self-pay | Admitting: Cardiology

## 2020-11-21 ENCOUNTER — Other Ambulatory Visit: Payer: Self-pay

## 2020-11-21 ENCOUNTER — Ambulatory Visit (INDEPENDENT_AMBULATORY_CARE_PROVIDER_SITE_OTHER): Payer: Medicare Other | Admitting: Cardiology

## 2020-11-21 VITALS — BP 116/80 | Resp 20 | Ht 62.0 in | Wt 247.8 lb

## 2020-11-21 DIAGNOSIS — E7800 Pure hypercholesterolemia, unspecified: Secondary | ICD-10-CM

## 2020-11-21 DIAGNOSIS — I700 Atherosclerosis of aorta: Secondary | ICD-10-CM

## 2020-11-21 DIAGNOSIS — E782 Mixed hyperlipidemia: Secondary | ICD-10-CM

## 2020-11-21 MED ORDER — ROSUVASTATIN CALCIUM 20 MG PO TABS: 20.0000 mg | ORAL_TABLET | Freq: Every day | ORAL | 3 refills | Status: DC

## 2020-11-21 NOTE — Patient Instructions (Signed)
Increase aerobic activity  Try and lose weight  We will increase Crestor to 20 mg daily. Get your follow up lab work in November.

## 2020-12-28 ENCOUNTER — Other Ambulatory Visit: Payer: Self-pay | Admitting: Orthopedic Surgery

## 2020-12-28 DIAGNOSIS — M25512 Pain in left shoulder: Secondary | ICD-10-CM

## 2021-01-06 ENCOUNTER — Other Ambulatory Visit: Payer: Medicare Other

## 2021-02-08 ENCOUNTER — Other Ambulatory Visit: Payer: Self-pay | Admitting: Specialist

## 2021-02-08 DIAGNOSIS — Z1231 Encounter for screening mammogram for malignant neoplasm of breast: Secondary | ICD-10-CM

## 2021-02-13 ENCOUNTER — Ambulatory Visit
Admission: RE | Admit: 2021-02-13 | Discharge: 2021-02-13 | Disposition: A | Discharge status: Home / Self Care | Payer: Medicare Other | Source: Ambulatory Visit | Attending: Family Medicine | Admitting: Family Medicine

## 2021-02-13 ENCOUNTER — Other Ambulatory Visit: Payer: Self-pay

## 2021-02-13 DIAGNOSIS — Z1231 Encounter for screening mammogram for malignant neoplasm of breast: Secondary | ICD-10-CM

## 2022-02-19 NOTE — Progress Notes (Unsigned)
Haley Larson Date of Birth: 10-23-1950 Medical Record #[id number]  History of Present Illness: Haley Larson is seen for  follow up. She has a history of morbid obesity and dsypnea. She has a strong family history of CAD. Stress Cardiolite studies in December 2009 and July 2014 were normal.  She also has a history of hyperlipidemia. She denies a history of diabetes or hypertension. She had remote gastric bypass surgery in the 1970s. She has not been evaluated for sleep apnea in the past. Echo in July 2014 was also normal. She was noted to have a murmur during hospitalization in 2017 but this wasn't felt to be pathologic and most likely related to anemia. She is s/p THR and cholecystectomy in 2017.  On follow up today she reports that she is doing well. She is retired now. She is very sedentary. Weight is down 8 lbs from last year. She denies any chest pain or SOB. No edema or palpitations. Multiple family members have heart disease but also had diabetes and were smokers. She states when she had her lab work in May she wasn't taking her medication (Crestor).   Current Outpatient Medications on File Prior to Visit  Medication Sig Dispense Refill   albuterol (VENTOLIN HFA) 108 (90 Base) MCG/ACT inhaler Inhale into the lungs.     buPROPion (WELLBUTRIN SR) 150 MG 12 hr tablet Take by mouth.     Cholecalciferol 25 MCG (1000 UT) tablet Take 1,000 Units by mouth daily.      DOCOSAHEXAENOIC ACID PO Take 1 g by mouth.     fluticasone (FLONASE) 50 MCG/ACT nasal spray SPRAY 1 SPRAY INTO EACH NOSTRIL EVERY DAY     levocetirizine (XYZAL) 5 MG tablet Take 1 tablet by mouth every evening.     levothyroxine (SYNTHROID) 75 MCG tablet Take 1 tablet by mouth daily.     montelukast (SINGULAIR) 10 MG tablet Take 1 tablet by mouth at bedtime.     Multiple Vitamin (MULTIVITAMIN) capsule Take 1 capsule by mouth daily.      Omega-3 Fatty Acids (FISH OIL) 1000 MG CAPS Take 1 capsule by mouth daily.     omeprazole  (PRILOSEC) 40 MG capsule TAKE ONE CAPSULE (40 MG TOTAL) BY MOUTH DAILY.     rosuvastatin (CRESTOR) 20 MG tablet Take 1 tablet (20 mg total) by mouth daily. 90 tablet 3   No current facility-administered medications on file prior to visit.    No Known Allergies  Past Medical History:  Diagnosis Date   Arthritis    Chest pain    Dyslipidemia    Family history of early CAD    STRONG   Fibromyalgia    GERD (gastroesophageal reflux disease)    H/O: hysterectomy    Hypothyroidism    Morbid obesity ([facility])    PONV (postoperative nausea and vomiting)    Postmenopausal    Seasonal allergies     Past Surgical History:  Procedure Laterality Date   ABDOMINAL HYSTERECTOMY     CHOLECYSTECTOMY N/A 10/17/2015   Procedure: LAPAROSCOPIC CHOLECYSTECTOMY WITH INTRAOPERATIVE CHOLANGIOGRAM;  Surgeon: Donnie Mesa, MD;  Location: [place];  Service: General;  Laterality: N/A;   COLONOSCOPY  06/2009   Dr Collene Mares at outpt endoscopy center for family hax colon cancer.  Normal study to terminal ileum.    GASTRIC BYPASS  1977   ROTATOR CUFF REPAIR  04/2007   ROTATOR CUFF REPAIR  08/2007   TONSILLECTOMY     TOTAL HIP ARTHROPLASTY Right 07/19/2015  Procedure: TOTAL HIP ARTHROPLASTY ANTERIOR APPROACH;  Surgeon: Ninetta Lights, MD;  Location: [place];  Service: Orthopedics;  Laterality: Right;    Social History   Tobacco Use  Smoking Status Former   Packs/day: 1.00   Years: 30.00   Total pack years: 30.00   Types: Cigarettes   Quit date: 03/25/2006   Years since quitting: 15.9  Smokeless Tobacco Never    Social History   Substance and Sexual Activity  Alcohol Use No    Family History  Problem Relation Age of Onset   Other Father        infection    Coronary artery disease Mother        with stents   Atrial fibrillation Mother        with pacemaker   Heart failure Mother    Diabetes Mother    Heart attack Brother    Heart attack Brother 67       massive  MI   Heart attack Brother 82        x3   Heart attack Brother 37       with stent   Coronary artery disease Brother        with stent   Alcohol abuse Brother    Coronary artery disease Brother        with stents    Review of Systems: As noted in history of present illness..  All other systems were reviewed and are negative.  Physical Exam: There is no height or weight on file to calculate BMI.  There were no vitals taken for this visit. GENERAL:  Well appearing, obese WF in NAD HEENT:  PERRL, EOMI, sclera are clear. Oropharynx is clear. NECK:  No jugular venous distention, carotid upstroke brisk and symmetric, no bruits, no thyromegaly or adenopathy LUNGS:  Clear to auscultation bilaterally CHEST:  Unremarkable HEART:  RRR,  PMI not displaced or sustained,S1 and S2 within normal limits, no S3, no S4: no clicks, no rubs, no murmurs ABD:  Soft, nontender. BS +, no masses or bruits. No hepatomegaly, no splenomegaly EXT:  2 + pulses throughout, no edema, no cyanosis no clubbing SKIN:  Warm and dry.  No rashes NEURO:  Alert and oriented x 3. Cranial nerves II through XII intact. PSYCH:  Cognitively intact   LABORATORY DATA: Lab Results  Component Value Date   WBC 12.5 (H) 10/23/2015   HGB 11.5 (L) 10/23/2015   HCT 36.6 10/23/2015   PLT 376 10/23/2015   GLUCOSE 78 10/23/2015   ALT 39 10/23/2015   AST 48 (H) 10/23/2015   NA 134 (L) 10/23/2015   K 3.8 10/23/2015   CL 97 (L) 10/23/2015   CREATININE 0.82 10/23/2015   BUN 7 10/23/2015   CO2 22 10/23/2015   TSH 0.284 (L) 07/21/2015   INR 1.30 10/16/2015   HGBA1C 5.5 10/16/2015   Labs dated 01/24/17: normal CBC. CMET normal except alk phos 138. Iron studies normal. TSH 4.81 with normal free T4.  Cholesterol 219, triglycerides 261, HDL 44. LDL 123.  Dated 07/16/19: cholesterol 192, triglycerides 206, HDL 44, LDL 112. TSH 5.69. Free T4 normal. CMET and CBC normal. Dated 08/16/20: cholesterol 233, triglycerides 240, HDL 45, LDL 144. CBC, CMET and TFTs normal.     ECG today demonstrates normal sinus rhythm. Rate 67. Mild voltage criteria for LVH. I have personally reviewed and interpreted this study.   Assessment / Plan: 1. Morbid obesity. Needs to intensify efforts at weight loss and increased  aerobic activity.   2. Hyperlipidemia-now back on Crestor. Based on prior history I would recommend increasing Crestor to 20 mg daily. Ideally goal LDL < 70 since she does have aortic plaque and strong family history of CAD.   3. Aortic atherosclerosis. Noted on CT 2017.

## 2022-02-20 ENCOUNTER — Ambulatory Visit: Payer: Medicare Other | Attending: Cardiology | Admitting: Cardiology

## 2022-02-20 ENCOUNTER — Encounter: Payer: Self-pay | Admitting: Cardiology

## 2022-02-20 VITALS — BP 114/78 | HR 64 | Wt 236.2 lb

## 2022-02-20 DIAGNOSIS — E782 Mixed hyperlipidemia: Secondary | ICD-10-CM | POA: Insufficient documentation

## 2022-02-20 DIAGNOSIS — I700 Atherosclerosis of aorta: Secondary | ICD-10-CM | POA: Insufficient documentation

## 2022-02-21 NOTE — Addendum Note (Signed)
Addended by: Johney Frame A on: 02/21/2022 02:49 PM   Modules accepted: Orders

## 2023-02-22 NOTE — Progress Notes (Unsigned)
Haley Larson Date of Birth: 06/23/1950 Medical Record #[id number]  History of Present Illness: Haley Larson is seen for  follow up. She has a history of morbid obesity and dsypnea. She has a strong family history of CAD. Stress Cardiolite studies in December 2009 and July 2014 were normal.  She also has a history of hyperlipidemia. She denies a history of diabetes or hypertension. She had remote gastric bypass surgery in the 1970s. She has not been evaluated for sleep apnea in the past. Echo in July 2014 was also normal. She was noted to have a murmur during hospitalization in 2017 but this wasn't felt to be pathologic and most likely related to anemia. She is s/p THR and cholecystectomy in 2017.  On follow up today she reports that she is doing well. She is retired now. She has lost 10 lbs since I last saw her. She is now on Crestor for cholesterol and metformin for prediabetes. She does exercise with some dance classes. She feels well. No chest pain or dyspnea.    Current Outpatient Medications on File Prior to Visit  Medication Sig Dispense Refill   albuterol (VENTOLIN HFA) 108 (90 Base) MCG/ACT inhaler Inhale into the lungs.     buPROPion (WELLBUTRIN SR) 150 MG 12 hr tablet Take by mouth.     Cholecalciferol 25 MCG (1000 UT) tablet Take 1,000 Units by mouth daily.  (Patient not taking: Reported on 02/20/2022)     DOCOSAHEXAENOIC ACID PO Take 1 g by mouth. (Patient not taking: Reported on 02/20/2022)     DULoxetine (CYMBALTA) 30 MG capsule Take 30 mg by mouth daily.     fluticasone (FLONASE) 50 MCG/ACT nasal spray SPRAY 1 SPRAY INTO EACH NOSTRIL EVERY DAY     levocetirizine (XYZAL) 5 MG tablet Take 1 tablet by mouth every evening.     levothyroxine (SYNTHROID) 75 MCG tablet Take 1 tablet by mouth daily.     metFORMIN (GLUCOPHAGE-XR) 500 MG 24 hr tablet Take 500 mg by mouth daily.     montelukast (SINGULAIR) 10 MG tablet Take 1 tablet by mouth at bedtime.     Multiple Vitamin  (MULTIVITAMIN) capsule Take 1 capsule by mouth daily.      Omega-3 Fatty Acids (FISH OIL) 1000 MG CAPS Take 1 capsule by mouth daily.     omeprazole (PRILOSEC) 40 MG capsule TAKE ONE CAPSULE (40 MG TOTAL) BY MOUTH DAILY.     rosuvastatin (CRESTOR) 20 MG tablet Take 1 tablet (20 mg total) by mouth daily. 90 tablet 3   No current facility-administered medications on file prior to visit.    No Known Allergies  Past Medical History:  Diagnosis Date   Arthritis    Chest pain    Dyslipidemia    Family history of early CAD    STRONG   Fibromyalgia    GERD (gastroesophageal reflux disease)    H/O: hysterectomy    Hypothyroidism    Morbid obesity (HCC)    PONV (postoperative nausea and vomiting)    Postmenopausal    Seasonal allergies     Past Surgical History:  Procedure Laterality Date   ABDOMINAL HYSTERECTOMY     CHOLECYSTECTOMY N/A 10/17/2015   Procedure: LAPAROSCOPIC CHOLECYSTECTOMY WITH INTRAOPERATIVE CHOLANGIOGRAM;  Surgeon: Manus Rudd, MD;  Location: [facility] OR;  Service: General;  Laterality: N/A;   COLONOSCOPY  06/2009   Dr Loreta Ave at outpt endoscopy center for family hax colon cancer.  Normal study to terminal ileum.    GASTRIC BYPASS  1977   ROTATOR CUFF REPAIR  04/2007   ROTATOR CUFF REPAIR  08/2007   TONSILLECTOMY     TOTAL HIP ARTHROPLASTY Right 07/19/2015   Procedure: TOTAL HIP ARTHROPLASTY ANTERIOR APPROACH;  Surgeon: Loreta Ave, MD;  Location: [facility] OR;  Service: Orthopedics;  Laterality: Right;    Social History   Tobacco Use  Smoking Status Former   Current packs/day: 0.00   Average packs/day: 1 pack/day for 30.0 years (30.0 ttl pk-yrs)   Types: Cigarettes   Start date: 03/25/1976   Quit date: 03/25/2006   Years since quitting: 16.9  Smokeless Tobacco Never    Social History   Substance and Sexual Activity  Alcohol Use No    Family History  Problem Relation Age of Onset   Other Father        infection    Coronary artery disease Mother        with  stents   Atrial fibrillation Mother        with pacemaker   Heart failure Mother    Diabetes Mother    Heart attack Brother    Heart attack Brother 87       massive  MI   Heart attack Brother 83       x3   Heart attack Brother 53       with stent   Coronary artery disease Brother        with stent   Alcohol abuse Brother    Coronary artery disease Brother        with stents    Review of Systems: As noted in history of present illness..  All other systems were reviewed and are negative.  Physical Exam: There is no height or weight on file to calculate BMI.  There were no vitals taken for this visit. GENERAL:  Well appearing, obese WF in NAD HEENT:  PERRL, EOMI, sclera are clear. Oropharynx is clear. NECK:  No jugular venous distention, carotid upstroke brisk and symmetric, no bruits, no thyromegaly or adenopathy LUNGS:  Clear to auscultation bilaterally CHEST:  Unremarkable HEART:  RRR,  PMI not displaced or sustained,S1 and S2 within normal limits, no S3, no S4: no clicks, no rubs, no murmurs ABD:  Soft, nontender. BS +, no masses or bruits. No hepatomegaly, no splenomegaly EXT:  2 + pulses throughout, no edema, no cyanosis no clubbing SKIN:  Warm and dry.  No rashes NEURO:  Alert and oriented x 3. Cranial nerves II through XII intact. PSYCH:  Cognitively intact   LABORATORY DATA: Lab Results  Component Value Date   WBC 12.5 (H) 10/23/2015   HGB 11.5 (L) 10/23/2015   HCT 36.6 10/23/2015   PLT 376 10/23/2015   GLUCOSE 78 10/23/2015   ALT 39 10/23/2015   AST 48 (H) 10/23/2015   NA 134 (L) 10/23/2015   K 3.8 10/23/2015   CL 97 (L) 10/23/2015   CREATININE 0.82 10/23/2015   BUN 7 10/23/2015   CO2 22 10/23/2015   TSH 0.284 (L) 07/21/2015   INR 1.30 10/16/2015   HGBA1C 5.5 10/16/2015   Labs dated 01/24/17: normal CBC. CMET normal except alk phos 138. Iron studies normal. TSH 4.81 with normal free T4.  Cholesterol 219, triglycerides 261, HDL 44. LDL 123.  Dated  07/16/19: cholesterol 192, triglycerides 206, HDL 44, LDL 112. TSH 5.69. Free T4 normal. CMET and CBC normal. Dated 08/16/20: cholesterol 233, triglycerides 240, HDL 45, LDL 144. CBC, CMET and TFTs normal.  Dated 08/27/21: glucose  104, potassium 5.7. alk phos 142. Otherwise CMET normal. CBC normal, TSH and free T4. A1c 5.8%. cholesterol 161, triglycerides 187, HDL 46. LDL 83   ECG today demonstrates normal sinus rhythm. Rate 64. No acute change. I have personally reviewed and interpreted this study.   Assessment / Plan: 1. Morbid obesity. Congratulated  efforts at weight loss and increased aerobic activity.   2. Hyperlipidemia-now back on Crestor. LDL from 144>>83. Triglycerides improved with weight loss.   3. Aortic atherosclerosis. Noted on CT 2017.   4. Prediabetes. Now on metformin.     Follow up in one year.

## 2023-02-26 ENCOUNTER — Encounter: Payer: Self-pay | Admitting: Cardiology

## 2023-02-26 ENCOUNTER — Ambulatory Visit: Payer: Medicare Other | Attending: Cardiology | Admitting: Cardiology

## 2023-02-26 VITALS — BP 100/68 | HR 66 | Ht 63.0 in | Wt 208.0 lb

## 2023-02-26 DIAGNOSIS — I700 Atherosclerosis of aorta: Secondary | ICD-10-CM

## 2023-02-26 DIAGNOSIS — E782 Mixed hyperlipidemia: Secondary | ICD-10-CM

## 2023-02-26 NOTE — Patient Instructions (Signed)
Medication Instructions:  Continue same medications *If you need a refill on your cardiac medications before your next appointment, please call your pharmacy*   Lab Work: None ordered   Testing/Procedures: None ordered   Follow-Up: At [facility], you and your health needs are our priority.  As part of our continuing mission to provide you with exceptional heart care, we have created designated Provider Care Teams.  These Care Teams include your primary Cardiologist (physician) and Advanced Practice Providers (APPs -  Physician Assistants and Nurse Practitioners) who all work together to provide you with the care you need, when you need it.  We recommend signing up for the patient portal called "MyChart".  Sign up information is provided on this After Visit Summary.  MyChart is used to connect with patients for Virtual Visits (Telemedicine).  Patients are able to view lab/test results, encounter notes, upcoming appointments, etc.  Non-urgent messages can be sent to your provider as well.   To learn more about what you can do with MyChart, go to [email or web address].    Your next appointment:  1 year   Call in August to schedule Dec appointment    Provider:  Dr.Jordan

## 2023-02-28 ENCOUNTER — Ambulatory Visit: Payer: Medicare Other | Admitting: Cardiology

## 2024-03-16 NOTE — Progress Notes (Signed)
 Subjective:  Haley Larson is a 73 y.o. female here for medication refill.  She has been diagnosed with aortic atherosclerosis and is under the care of Dr. Jordan at [facility], a cardiologist, whom she consults at least once annually. She had a colonoscopy in 2021 and is due for a follow-up in 2028.  She has a history of skin cancer and continues to receive treatment from [facility].  She reports that her thyroid levels are within the normal range, although she feels they may not be optimal.  Her depression and anxiety are well-managed.  She is currently on Crestor  for cholesterol management and has been adhering to this medication regimen consistently. She expresses a desire to have her cholesterol levels checked and has been fasting today, with the exception of a cup of coffee.  She has thrombocytopenia, but her blood counts have been stable, negating the need for further hematology consultations.  She is prediabetic and is taking metformin with breakfast. She has reduced the frequency of her tirzepatide injections to every 2 weeks due to cost considerations, but plans to resume weekly injections in January 2026. She is on tirzepatide 65 units through a mail order platform. She has observed weight stability, although she reports overeating during the holiday season and has noticed a slight weight gain.  She is on meloxicam for orthopedic discomfort.  She is on Cymbalta  for fibromyalgia-type pain, depression, and anxiety, and Wellbutrin.  She suspects a urinary tract infection due to increased urination and burning sensation during urination.  Review of Systems - All other systems were reviewed and are negative unless stated in HPI.  Family History[1] Past Medical History:  Diagnosis Date   Allergy    Arthritis    Fibromyalgia    GERD (gastroesophageal reflux disease)    History of transfusion    Hyperlipidemia    Hypothyroid    PONV (postoperative nausea and  vomiting)    Past Surgical History:  Procedure Laterality Date   Cholecystectomy  10/16/2015   Eye surgery     cataract bilateral   Gastric bypass     Hernia repair     Hysterectomy     Joint replacement     right hip   Roux-en-y procedure     Tonsillectomy and adenoidectomy     Pediatric History  Patient Parents   Not on file   Other Topics Concern   Not on file  Social History Narrative   Not on file    Objective:   BP 137/67 (BP Location: Left Upper Arm, Patient Position: Sitting)   Pulse 65   Temp 97.5 F (36.4 C) (Temporal)   Resp 16   Ht 5' 3 (1.6 m)   Wt 209 lb (94.8 kg)   SpO2 95%   Breastfeeding No   BMI 37.02 kg/m  Gen: Alert, oriented, non toxic, and well hydrated.  No signs of acute distress. Head: Normocephalic.  Atraumatic.  Sclera anicteric.  Conjunctiva clear. Neck: Supple, no lymphadenopathy Respiratory:  Lungs clear to auscultation.  No use of accessory muscles. Cardiovascular: Regular rate and rhythm.  No murmurs noted Abdominal:  Soft, non tender, non distended.  No hepatosplenomegally Neuro: Cranial nerves intact grossly.  No loss of strength, sensation Extremities:  Full range of motion.  No cyanosis, clubbing, or edema. Skin:  No rashes noted.  No bleeding or bruising noted. Psych: Oriented, alert.  Normal mood and affect.  Assessment:     ICD-10-CM   1. Dyslipidemia  E78.5 Lipid Panel  With LDL/HDL Ratio    2. Essential (hemorrhagic) thrombocythemia (*)  D47.3     3. Acquired hypothyroidism  E03.9     4. Morbid obesity (*)  E66.01     5. History of gastric bypass  Z98.84     6. Essential hypertension with goal blood pressure less than 130/80  I10       Plan:   Orders Placed This Encounter  Procedures   Lipid Panel With LDL/HDL Ratio   1. Aortic atherosclerosis: - She sees Dr. Jordan, cardiologist, at least once a year.  2. Skin cancer: - She continues to see [facility] dermatology for follow-up.  3.  Thyroid management: - Her thyroid levels are stable. - No recheck of thyroid labs is needed unless symptoms arise.  4. Depression and anxiety: - Her condition is stable.  5. Cholesterol management: - Her cholesterol levels have shown an upward trend. - She is taking Crestor  consistently. - A fasting lipid panel will be conducted today.  6. Thrombocytopenia: - Her blood counts have been stable. - She no longer needs to see the hematologist.  7. Prediabetes: - She is on metformin for breakfast and tirzepatide 65 units every two weeks. She plans to resume weekly injections in January to manage recent weight gain.  8. Orthopedic aches and pains: - She is taking meloxicam daily for pain management.  9. Fibromyalgia: - She is on Cymbalta  for fibromyalgia-type pain and Wellbutrin for depression and anxiety.  10. Suspected urinary tract infection: - She reports frequent urination and burning. - A urine test will be conducted today to check for UTI.   Follow-up: The patient will follow up in June 2026 for her Medicare annual wellness visit. - Continue taking medications as previously prescribed.   - Follow up for refill in 6 months. - If symptoms worsen or persist, advised pt to contact office for re-evaluation - I discussed this diagnosis with the patient and discussed the treatment plan with them.  This treatment plan is also outlined in the Patient Instructions and a copy of this was provided to the patient.   AI technology was used in creating this visit note. Verbal consent from the patient/caregiver was obtained prior to its use.        [1] Family History Problem Relation Name Age of Onset   Anesthesia problems Neg Hx     Arthritis Father Elsie    Breast cancer Cousin      Maternal Aunt     Cancer Brother Deward 45        colon    Paternal Grandmother Dora foley 79        esophagus    Sister Devere 90        SHE PASSED MAY 13TH 2024   Clotting disorder Neg Hx      Diabetes Mother Vernell deward. Devere.    Heart attack Brother Elgin.  Prentiss.  Tommy. Deward. Devere Lavella Misty Ray     Brother Tommy     Brother Ray    Heart disease Brother Elgin.  Prentiss.  Tommy. Deward. Devere     Mother Vernell deward. Devere.    Hypertension Brother Deward Lavella [place].  Prentiss.  Tommy. Deward. Devere Lavella Misty Ray     Brother Tommy     Brother Ray     Father Elsie Darin Devere  PASS AWAY 08/05/2022   Mental illness Sister     Ovarian cancer Neg Hx     Stroke Mother Vernell mt. Devere.

## 2024-03-16 NOTE — Progress Notes (Signed)
 " Cardiology Office Note:    Date:  03/23/2024   ID:  Haley, Larson 03-04-1951, MRN [id number]  PCP:  Teresa Aldona CROME, NP   [facility] Providers Cardiologist:  Peter Jordan, MD     Referring MD: Teresa Aldona CROME, NP   Chief Complaint  Patient presents with   Follow-up    HLD    History of Present Illness:    Haley Larson is a 73 y.o. female with a hx of obesity s/p remote gastric bypass surgery, hypertension, hyperlipidemia, and strong family history of CAD.  She had stress test in 2009 and 2014 that showed normal perfusion.  Echocardiogram in 2014 for cardiac murmur demonstrated LVEF 55-60%, no RWMA, and no significant valvular disease.  She was last seen in clinic by Dr. Jordan 02/2023.  She presents today for annual follow-up.  She remains on 20 mg Crestor , and metformin.  She reports that this past weekend she fell while running after her grandson. She reports no chest pain, SOB, orthopnea No dedicated exercise regimen but she watches 2 young grandchildren.  She is active with house chores and yard work.    Past Medical History:  Diagnosis Date   Arthritis    Chest pain    Dyslipidemia    Family history of early CAD    STRONG   Fibromyalgia    GERD (gastroesophageal reflux disease)    H/O: hysterectomy    Hypothyroidism    Morbid obesity (HCC)    PONV (postoperative nausea and vomiting)    Postmenopausal    Seasonal allergies     Past Surgical History:  Procedure Laterality Date   ABDOMINAL HYSTERECTOMY     CHOLECYSTECTOMY N/A 10/17/2015   Procedure: LAPAROSCOPIC CHOLECYSTECTOMY WITH INTRAOPERATIVE CHOLANGIOGRAM;  Surgeon: Donnice Lima, MD;  Location: [facility] OR;  Service: General;  Laterality: N/A;   COLONOSCOPY  06/2009   Dr Kristie at outpt endoscopy center for family hax colon cancer.  Normal study to terminal ileum.    GASTRIC BYPASS  1977   ROTATOR CUFF REPAIR  04/2007   ROTATOR CUFF REPAIR  08/2007   TONSILLECTOMY     TOTAL HIP  ARTHROPLASTY Right 07/19/2015   Procedure: TOTAL HIP ARTHROPLASTY ANTERIOR APPROACH;  Surgeon: Toribio JULIANNA Chancy, MD;  Location: [facility] OR;  Service: Orthopedics;  Laterality: Right;    Current Medications: Active Medications[1]   Allergies:   Patient has no known allergies.   Social History   Socioeconomic History   Marital status: Married    Spouse name: Not on file   Number of children: 1   Years of education: Not on file   Highest education level: Not on file  Occupational History   Occupation: CNA    Employer: [employer]  Tobacco Use   Smoking status: Former    Current packs/day: 0.00    Average packs/day: 1 pack/day for 30.0 years (30.0 ttl pk-yrs)    Types: Cigarettes    Start date: 03/25/1976    Quit date: 03/25/2006    Years since quitting: 18.0   Smokeless tobacco: Never  Substance and Sexual Activity   Alcohol use: No   Drug use: No   Sexual activity: Not on file  Other Topics Concern   Not on file  Social History Narrative   Not on file   Social Drivers of Health   Tobacco Use: Medium Risk (03/23/2024)   Patient History    Smoking Tobacco Use: Former    Smokeless Tobacco Use:  Never    Passive Exposure: Not on file  Financial Resource Strain: Low Risk (08/29/2023)   Received from [facility]   Overall Financial Resource Strain (CARDIA)    Difficulty of Paying Living Expenses: Not very hard  Food Insecurity: No Food Insecurity (08/29/2023)   Received from [facility]   Epic    Within the past 12 months, you worried that your food would run out before you got the money to buy more.: Never true    Within the past 12 months, the food you bought just didn't last and you didn't have money to get more.: Never true  Transportation Needs: No Transportation Needs (08/29/2023)   Received from [facility] - Transportation    Lack of Transportation (Medical): No    Lack of Transportation (Non-Medical): No  Physical Activity: Insufficiently Active  (08/29/2023)   Received from [facility]   Exercise Vital Sign    On average, how many days per week do you engage in moderate to strenuous exercise (like a brisk walk)?: 4 days    On average, how many minutes do you engage in exercise at this level?: 10 min  Stress: No Stress Concern Present (08/29/2023)   Received from [facility] - Occupational Stress Questionnaire    Feeling of Stress : Not at all  Social Connections: Socially Integrated (08/29/2023)   Received from [facility]   Social Network    How would you rate your social network (family, work, friends)?: Good participation with social networks  Depression (PHQ2-9): Not on file  Alcohol Screen: Not on file  Housing: Low Risk (08/29/2023)   Received from [facility]    In the last 12 months, was there a time when you were not able to pay the mortgage or rent on time?: No    In the past 12 months, how many times have you moved where you were living?: 0    At any time in the past 12 months, were you homeless or living in a shelter (including now)?: No  Utilities: Not At Risk (08/29/2023)   Received from [facility] Utilities    Threatened with loss of utilities: No  Health Literacy: Not on file     Family History: The patient's family history includes Alcohol abuse in her brother; Atrial fibrillation in her mother; Coronary artery disease in her brother, brother, and mother; Diabetes in her mother; Heart attack in her brother; Heart attack (age of onset: 12) in her brother; Heart attack (age of onset: 45) in her brother; Heart attack (age of onset: 51) in her brother; Heart failure in her mother; Other in her father.  ROS:   Please see the history of present illness.     All other systems reviewed and are negative.  EKGs/Labs/Other Studies Reviewed:    The following studies were reviewed today:  EKG Interpretation Date/Time:  Tuesday March 23 2024 08:04:46  EST Ventricular Rate:  64 PR Interval:  140 QRS Duration:  98 QT Interval:  414 QTC Calculation: 427 R Axis:   2  Text Interpretation: Normal sinus rhythm with sinus arrhythmia Septal infarct , age undetermined When compared with ECG of 26-Feb-2023 11:02, Septal infarct is now Present Confirmed by Madie Slough ([id number]) on 03/23/2024 8:09:27 AM    Recent Labs: No results found for requested labs within last 365 days.  Recent Lipid Panel No results found for: CHOL, TRIG, HDL,  CHOLHDL, VLDL, LDLCALC, LDLDIRECT   Risk Assessment/Calculations:                Physical Exam:    VS:  BP 126/84   Pulse 64   Ht 5' 2 (1.575 m)   Wt 208 lb 9.6 oz (94.6 kg)   SpO2 97%   BMI 38.15 kg/m     Wt Readings from Last 3 Encounters:  03/23/24 208 lb 9.6 oz (94.6 kg)  02/26/23 208 lb (94.3 kg)  02/20/22 236 lb 3.2 oz (107.1 kg)     GEN:  Well nourished, well developed in no acute distress HEENT: Normal NECK: No JVD; No carotid bruits LYMPHATICS: No lymphadenopathy CARDIAC: RRR, no murmurs, rubs, gallops RESPIRATORY:  Clear to auscultation without rales, wheezing or rhonchi  ABDOMEN: Soft, non-tender, non-distended MUSCULOSKELETAL:  No edema; No deformity  SKIN: Warm and dry NEUROLOGIC:  Alert and oriented x 3 PSYCHIATRIC:  Normal affect   ASSESSMENT:    1. Hyperlipidemia with target LDL less than 70   2. Family history of early CAD   3. Obesity (BMI 30-39.9)    PLAN:    In order of problems listed above:  Hyperlipidemia with LDL goal < 70 Will attempt a lower goal given prediabetes. Lipid panel 02/2024 Total chol: 177 Trig 128 HDL 55 LDL 99 - on 20 mg crestor , sporadically taking OTC fish oil - will go up to 40 mg crestor    Obesity S/p remote gastric bypass   Follow up in 1 year.       Medication Adjustments/Labs and Tests Ordered: Current medicines are reviewed at length with the patient today.  Concerns regarding medicines are outlined above.   Orders Placed This Encounter  Procedures   EKG 12-Lead   No orders of the defined types were placed in this encounter.   There are no Patient Instructions on file for this visit.   Signed, Jon Nat Hails, [place]  03/23/2024 8:33 AM    [facility]     [1]  Current Meds  Medication Sig   albuterol (VENTOLIN HFA) 108 (90 Base) MCG/ACT inhaler Inhale into the lungs.   aspirin EC (BAYER ASPIRIN EC LOW DOSE) 81 MG tablet Take 81 mg by mouth daily.   buPROPion (WELLBUTRIN SR) 150 MG 12 hr tablet Take by mouth.   Cholecalciferol 25 MCG (1000 UT) tablet Take 1,000 Units by mouth daily.   DOCOSAHEXAENOIC ACID PO Take 1 g by mouth.   DULoxetine  (CYMBALTA ) 30 MG capsule Take 30 mg by mouth daily.   fluticasone (FLONASE) 50 MCG/ACT nasal spray SPRAY 1 SPRAY INTO EACH NOSTRIL EVERY DAY   ibuprofen (ADVIL) 200 MG tablet Take 200 mg by mouth.   levocetirizine (XYZAL) 5 MG tablet Take 1 tablet by mouth every evening.   levothyroxine  (SYNTHROID ) 75 MCG tablet Take 1 tablet by mouth daily.   meloxicam (MOBIC) 15 MG tablet Take 15 mg by mouth.   metFORMIN (GLUCOPHAGE-XR) 500 MG 24 hr tablet Take 500 mg by mouth daily.   montelukast  (SINGULAIR ) 10 MG tablet Take 1 tablet by mouth at bedtime.   Multiple Vitamin (MULTIVITAMIN) capsule Take 1 capsule by mouth daily.    Omega-3 Fatty Acids (FISH OIL) 1000 MG CAPS Take 1 capsule by mouth daily.   omeprazole (PRILOSEC) 40 MG capsule TAKE ONE CAPSULE (40 MG TOTAL) BY MOUTH DAILY.   rosuvastatin  (CRESTOR ) 20 MG tablet Take 1 tablet (20 mg total) by mouth daily.   "

## 2024-03-23 ENCOUNTER — Encounter: Payer: Self-pay | Admitting: Physician Assistant

## 2024-03-23 ENCOUNTER — Ambulatory Visit: Admitting: Physician Assistant

## 2024-03-23 VITALS — BP 126/84 | HR 64 | Ht 62.0 in | Wt 208.6 lb

## 2024-03-23 DIAGNOSIS — Z8249 Family history of ischemic heart disease and other diseases of the circulatory system: Secondary | ICD-10-CM | POA: Diagnosis not present

## 2024-03-23 DIAGNOSIS — E669 Obesity, unspecified: Secondary | ICD-10-CM | POA: Insufficient documentation

## 2024-03-23 DIAGNOSIS — E785 Hyperlipidemia, unspecified: Secondary | ICD-10-CM | POA: Insufficient documentation

## 2024-03-23 DIAGNOSIS — I8390 Asymptomatic varicose veins of unspecified lower extremity: Secondary | ICD-10-CM | POA: Insufficient documentation

## 2024-03-23 MED ORDER — ROSUVASTATIN CALCIUM 40 MG PO TABS: 40.0000 mg | ORAL_TABLET | Freq: Every day | ORAL | 3 refills | Status: AC

## 2024-03-23 NOTE — Patient Instructions (Addendum)
 Medication Instructions:  INCREASE Rosuvastatin  (Crestor ) to 40 mg. Take 40 mg three times weekly, then 40 mg once daily. *If you need a refill on your cardiac medications before your next appointment, please call your pharmacy*  Lab Work: None ordered If you have labs (blood work) drawn today and your tests are completely normal, you will receive your results only by: MyChart Message (if you have MyChart) OR A paper copy in the mail If you have any lab test that is abnormal or we need to change your treatment, we will call you to review the results.  Testing/Procedures: None ordered  Follow-Up: At [facility], you and your health needs are our priority.  As part of our continuing mission to provide you with exceptional heart care, our providers are all part of one team.  This team includes your primary Cardiologist (physician) and Advanced Practice Providers or APPs (Physician Assistants and Nurse Practitioners) who all work together to provide you with the care you need, when you need it.  Your next appointment:   1 year(s)  Provider:   Peter Jordan, MD    We recommend signing up for the patient portal called MyChart.  Sign up information is provided on this After Visit Summary.  MyChart is used to connect with patients for Virtual Visits (Telemedicine).  Patients are able to view lab/test results, encounter notes, upcoming appointments, etc.  Non-urgent messages can be sent to your provider as well.   To learn more about what you can do with MyChart, go to [email or web address].   Other Instructions We are referring you to [facility] for your varicose veins.

## 2024-04-27 ENCOUNTER — Other Ambulatory Visit: Payer: Self-pay | Admitting: Vascular Surgery

## 2024-04-27 DIAGNOSIS — M7989 Other specified soft tissue disorders: Secondary | ICD-10-CM

## 2024-05-21 ENCOUNTER — Ambulatory Visit (HOSPITAL_COMMUNITY)

## 2024-05-21 ENCOUNTER — Encounter
